# Patient Record
Sex: Female | Born: 1945
Health system: Southern US, Community
[De-identification: ages and names within clinical notes are randomized; demographics above are authoritative.]

## PROBLEM LIST (undated history)

## (undated) DIAGNOSIS — F329 Major depressive disorder, single episode, unspecified: Secondary | ICD-10-CM

## (undated) DIAGNOSIS — F419 Anxiety disorder, unspecified: Secondary | ICD-10-CM

## (undated) DIAGNOSIS — M1990 Unspecified osteoarthritis, unspecified site: Secondary | ICD-10-CM

## (undated) DIAGNOSIS — F411 Generalized anxiety disorder: Secondary | ICD-10-CM

## (undated) DIAGNOSIS — F32A Depression, unspecified: Secondary | ICD-10-CM

## (undated) DIAGNOSIS — Z9889 Other specified postprocedural states: Secondary | ICD-10-CM

## (undated) DIAGNOSIS — L719 Rosacea, unspecified: Secondary | ICD-10-CM

## (undated) DIAGNOSIS — K649 Unspecified hemorrhoids: Secondary | ICD-10-CM

## (undated) DIAGNOSIS — G4700 Insomnia, unspecified: Secondary | ICD-10-CM

## (undated) DIAGNOSIS — I2699 Other pulmonary embolism without acute cor pulmonale: Secondary | ICD-10-CM

## (undated) DIAGNOSIS — E0500 Thyrotoxicosis with diffuse goiter without thyrotoxic crisis or storm: Secondary | ICD-10-CM

## (undated) DIAGNOSIS — G629 Polyneuropathy, unspecified: Secondary | ICD-10-CM

## (undated) DIAGNOSIS — G479 Sleep disorder, unspecified: Secondary | ICD-10-CM

## (undated) DIAGNOSIS — H01003 Unspecified blepharitis right eye, unspecified eyelid: Secondary | ICD-10-CM

## (undated) DIAGNOSIS — E559 Vitamin D deficiency, unspecified: Secondary | ICD-10-CM

## (undated) DIAGNOSIS — L4050 Arthropathic psoriasis, unspecified: Secondary | ICD-10-CM

## (undated) DIAGNOSIS — Z86711 Personal history of pulmonary embolism: Secondary | ICD-10-CM

## (undated) DIAGNOSIS — M8580 Other specified disorders of bone density and structure, unspecified site: Secondary | ICD-10-CM

## (undated) DIAGNOSIS — I739 Peripheral vascular disease, unspecified: Secondary | ICD-10-CM

## (undated) DIAGNOSIS — H04129 Dry eye syndrome of unspecified lacrimal gland: Secondary | ICD-10-CM

## (undated) DIAGNOSIS — E7800 Pure hypercholesterolemia, unspecified: Secondary | ICD-10-CM

## (undated) DIAGNOSIS — G43909 Migraine, unspecified, not intractable, without status migrainosus: Secondary | ICD-10-CM

## (undated) DIAGNOSIS — K219 Gastro-esophageal reflux disease without esophagitis: Secondary | ICD-10-CM

## (undated) DIAGNOSIS — I10 Essential (primary) hypertension: Secondary | ICD-10-CM

## (undated) DIAGNOSIS — E669 Obesity, unspecified: Secondary | ICD-10-CM

## (undated) DIAGNOSIS — E039 Hypothyroidism, unspecified: Secondary | ICD-10-CM

## (undated) DIAGNOSIS — M129 Arthropathy, unspecified: Secondary | ICD-10-CM

## (undated) DIAGNOSIS — I8290 Acute embolism and thrombosis of unspecified vein: Secondary | ICD-10-CM

## (undated) DIAGNOSIS — R112 Nausea with vomiting, unspecified: Secondary | ICD-10-CM

## (undated) HISTORY — PX: SPHINCTEROTOMY: SHX5279

## (undated) HISTORY — DX: Personal history of pulmonary embolism: Z86.711

## (undated) HISTORY — DX: Pure hypercholesterolemia, unspecified: E78.00

## (undated) HISTORY — DX: Anxiety disorder, unspecified: F41.9

## (undated) HISTORY — DX: Unspecified hemorrhoids: K64.9

## (undated) HISTORY — PX: TONSILLECTOMY: SUR1361

## (undated) HISTORY — DX: Polyneuropathy, unspecified: G62.9

## (undated) HISTORY — DX: Sleep disorder, unspecified: G47.9

## (undated) HISTORY — DX: Thyrotoxicosis with diffuse goiter without thyrotoxic crisis or storm: E05.00

## (undated) HISTORY — DX: Obesity, unspecified: E66.9

## (undated) HISTORY — DX: Vitamin D deficiency, unspecified: E55.9

## (undated) HISTORY — PX: OTHER SURGICAL HISTORY: SHX169

## (undated) HISTORY — DX: Generalized anxiety disorder: F41.1

## (undated) HISTORY — DX: Rosacea, unspecified: L71.9

## (undated) HISTORY — DX: Insomnia, unspecified: G47.00

## (undated) HISTORY — DX: Peripheral vascular disease, unspecified: I73.9

## (undated) HISTORY — DX: Dry eye syndrome of unspecified lacrimal gland: H04.129

## (undated) HISTORY — DX: Migraine, unspecified, not intractable, without status migrainosus: G43.909

## (undated) HISTORY — DX: Other specified disorders of bone density and structure, unspecified site: M85.80

## (undated) HISTORY — DX: Arthropathy, unspecified: M12.9

## (undated) HISTORY — DX: Unspecified blepharitis right eye, unspecified eyelid: H01.003

## (undated) HISTORY — DX: Hypothyroidism, unspecified: E03.9

## (undated) HISTORY — DX: Arthropathic psoriasis, unspecified: L40.50

---
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
## 1970-04-24 HISTORY — PX: OTHER SURGICAL HISTORY: SHX169

## 1974-04-24 HISTORY — PX: OTHER SURGICAL HISTORY: SHX169

## 1988-12-23 HISTORY — PX: SPHINCTEROTOMY: SHX5279

## 1993-06-22 DIAGNOSIS — I2699 Other pulmonary embolism without acute cor pulmonale: Secondary | ICD-10-CM

## 1993-06-22 HISTORY — DX: Other pulmonary embolism without acute cor pulmonale: I26.99

## 1994-04-24 DIAGNOSIS — I82409 Acute embolism and thrombosis of unspecified deep veins of unspecified lower extremity: Secondary | ICD-10-CM

## 1994-04-24 HISTORY — DX: Acute embolism and thrombosis of unspecified deep veins of unspecified lower extremity: I82.409

## 1998-12-17 ENCOUNTER — Other Ambulatory Visit: Admission: RE | Admit: 1998-12-17 | Discharge: 1998-12-17 | Payer: Self-pay | Admitting: Obstetrics and Gynecology

## 1999-12-05 ENCOUNTER — Encounter: Admission: RE | Admit: 1999-12-05 | Discharge: 1999-12-05 | Payer: Self-pay | Admitting: Internal Medicine

## 1999-12-05 ENCOUNTER — Encounter: Payer: Self-pay | Admitting: Internal Medicine

## 2000-01-31 ENCOUNTER — Other Ambulatory Visit: Admission: RE | Admit: 2000-01-31 | Discharge: 2000-01-31 | Payer: Self-pay | Admitting: Obstetrics and Gynecology

## 2001-03-04 ENCOUNTER — Other Ambulatory Visit: Admission: RE | Admit: 2001-03-04 | Discharge: 2001-03-04 | Payer: Self-pay | Admitting: Obstetrics and Gynecology

## 2001-10-29 ENCOUNTER — Ambulatory Visit (HOSPITAL_COMMUNITY): Admission: RE | Admit: 2001-10-29 | Discharge: 2001-10-29 | Payer: Self-pay | Admitting: Gastroenterology

## 2001-12-26 ENCOUNTER — Encounter: Admission: RE | Admit: 2001-12-26 | Discharge: 2002-02-04 | Payer: Self-pay

## 2002-03-04 ENCOUNTER — Other Ambulatory Visit: Admission: RE | Admit: 2002-03-04 | Discharge: 2002-03-04 | Payer: Self-pay | Admitting: Family Medicine

## 2002-05-07 ENCOUNTER — Encounter: Admission: RE | Admit: 2002-05-07 | Discharge: 2002-08-05 | Payer: Self-pay | Admitting: Family Medicine

## 2003-11-09 ENCOUNTER — Other Ambulatory Visit: Admission: RE | Admit: 2003-11-09 | Discharge: 2003-11-09 | Payer: Self-pay | Admitting: Family Medicine

## 2004-08-11 ENCOUNTER — Encounter: Admission: RE | Admit: 2004-08-11 | Discharge: 2004-09-15 | Payer: Self-pay | Admitting: Family Medicine

## 2004-11-14 ENCOUNTER — Other Ambulatory Visit: Admission: RE | Admit: 2004-11-14 | Discharge: 2004-11-14 | Payer: Self-pay | Admitting: Family Medicine

## 2007-12-16 ENCOUNTER — Encounter: Admission: RE | Admit: 2007-12-16 | Discharge: 2007-12-16 | Payer: Self-pay | Admitting: Family Medicine

## 2008-04-22 ENCOUNTER — Encounter: Admission: RE | Admit: 2008-04-22 | Discharge: 2008-04-22 | Payer: Self-pay | Admitting: Gastroenterology

## 2010-05-23 ENCOUNTER — Other Ambulatory Visit: Payer: Self-pay

## 2010-05-23 ENCOUNTER — Other Ambulatory Visit: Payer: Self-pay | Admitting: Family Medicine

## 2010-05-23 DIAGNOSIS — R102 Pelvic and perineal pain: Secondary | ICD-10-CM

## 2010-05-30 ENCOUNTER — Other Ambulatory Visit: Payer: Self-pay

## 2010-05-30 DIAGNOSIS — R102 Pelvic and perineal pain: Secondary | ICD-10-CM

## 2010-05-31 ENCOUNTER — Ambulatory Visit
Admission: RE | Admit: 2010-05-31 | Discharge: 2010-05-31 | Disposition: A | Discharge status: Home / Self Care | Payer: Medicare Other | Source: Ambulatory Visit

## 2010-05-31 DIAGNOSIS — R102 Pelvic and perineal pain: Secondary | ICD-10-CM

## 2010-08-15 ENCOUNTER — Other Ambulatory Visit: Payer: Self-pay | Admitting: Family Medicine

## 2010-08-15 DIAGNOSIS — R102 Pelvic and perineal pain: Secondary | ICD-10-CM

## 2011-04-21 ENCOUNTER — Ambulatory Visit: Payer: Medicare Other | Attending: Internal Medicine | Admitting: Physical Therapy

## 2011-04-21 DIAGNOSIS — M25569 Pain in unspecified knee: Secondary | ICD-10-CM | POA: Insufficient documentation

## 2011-04-21 DIAGNOSIS — IMO0001 Reserved for inherently not codable concepts without codable children: Secondary | ICD-10-CM | POA: Insufficient documentation

## 2011-04-26 ENCOUNTER — Encounter: Payer: Medicare Other | Admitting: Physical Therapy

## 2011-04-27 ENCOUNTER — Encounter: Payer: Medicare Other | Admitting: Physical Therapy

## 2011-05-02 ENCOUNTER — Encounter: Payer: Medicare Other | Admitting: Physical Therapy

## 2011-05-04 ENCOUNTER — Encounter: Payer: Medicare Other | Admitting: Physical Therapy

## 2011-05-09 ENCOUNTER — Encounter: Payer: Medicare Other | Admitting: Physical Therapy

## 2011-05-11 ENCOUNTER — Encounter: Payer: Medicare Other | Admitting: Physical Therapy

## 2011-05-16 ENCOUNTER — Encounter: Payer: Medicare Other | Admitting: Physical Therapy

## 2011-05-18 ENCOUNTER — Encounter: Payer: Medicare Other | Admitting: Physical Therapy

## 2011-05-23 ENCOUNTER — Ambulatory Visit: Payer: Medicare Other | Attending: Internal Medicine

## 2011-05-23 ENCOUNTER — Encounter: Payer: Medicare Other | Admitting: Physical Therapy

## 2011-05-23 DIAGNOSIS — IMO0001 Reserved for inherently not codable concepts without codable children: Secondary | ICD-10-CM | POA: Insufficient documentation

## 2011-05-23 DIAGNOSIS — M25569 Pain in unspecified knee: Secondary | ICD-10-CM | POA: Insufficient documentation

## 2011-05-25 ENCOUNTER — Ambulatory Visit: Payer: Medicare Other

## 2011-05-25 ENCOUNTER — Encounter: Payer: Medicare Other | Admitting: Physical Therapy

## 2011-05-30 ENCOUNTER — Ambulatory Visit: Payer: Medicare Other | Attending: Internal Medicine

## 2011-05-30 DIAGNOSIS — IMO0001 Reserved for inherently not codable concepts without codable children: Secondary | ICD-10-CM | POA: Insufficient documentation

## 2011-05-30 DIAGNOSIS — M25569 Pain in unspecified knee: Secondary | ICD-10-CM | POA: Insufficient documentation

## 2011-06-01 ENCOUNTER — Ambulatory Visit: Payer: Medicare Other

## 2011-06-05 ENCOUNTER — Ambulatory Visit: Payer: Medicare Other

## 2011-06-08 ENCOUNTER — Ambulatory Visit: Payer: Medicare Other

## 2011-06-12 ENCOUNTER — Ambulatory Visit: Payer: Medicare Other

## 2011-06-15 ENCOUNTER — Ambulatory Visit: Payer: Medicare Other

## 2011-09-07 ENCOUNTER — Other Ambulatory Visit: Payer: Self-pay | Admitting: Family Medicine

## 2011-09-07 ENCOUNTER — Other Ambulatory Visit (HOSPITAL_COMMUNITY)
Admission: RE | Admit: 2011-09-07 | Discharge: 2011-09-07 | Disposition: A | Discharge status: Home / Self Care | Payer: Medicare Other | Source: Ambulatory Visit | Attending: Family Medicine | Admitting: Family Medicine

## 2011-09-07 DIAGNOSIS — Z1159 Encounter for screening for other viral diseases: Secondary | ICD-10-CM | POA: Insufficient documentation

## 2011-09-07 DIAGNOSIS — Z124 Encounter for screening for malignant neoplasm of cervix: Secondary | ICD-10-CM | POA: Insufficient documentation

## 2012-04-24 HISTORY — PX: OTHER SURGICAL HISTORY: SHX169

## 2012-09-02 ENCOUNTER — Encounter (HOSPITAL_COMMUNITY): Payer: Self-pay | Admitting: Pharmacy Technician

## 2012-09-03 ENCOUNTER — Other Ambulatory Visit: Payer: Self-pay | Admitting: Orthopedic Surgery

## 2012-09-03 MED ORDER — DEXAMETHASONE SODIUM PHOSPHATE 10 MG/ML IJ SOLN: 10.0000 mg | Freq: Once | INTRAMUSCULAR | Status: DC

## 2012-09-03 NOTE — Progress Notes (Signed)
Preoperative surgical orders have been place into the [facility] hospital system for Ashley Griffith on 09/03/2012, 9:18 AM  by Patrica Duel for surgery on 09/18/2012.  Preop Knee Scope orders including IV Tylenol and IV Decadron as long as there are no contraindications to the above medications. Avel Peace, PA-C

## 2012-09-10 ENCOUNTER — Other Ambulatory Visit (HOSPITAL_COMMUNITY): Payer: Self-pay | Admitting: Orthopedic Surgery

## 2012-09-10 NOTE — Patient Instructions (Addendum)
Ashley Griffith  5/Ashley/2014   Your procedure is scheduled on: 09-18-2012  Report to [facility] at 645 AM.  Call this number if you have problems the morning of surgery [phone]   Remember:   Do not eat food or drink liquids :After Midnight.     Take these medicines the morning of surgery with A SIP OF WATER:  Synthroid, wellbutrin, pepcid, fluticasone nasal spray                                SEE [place] PREPARING FOR SURGERY SHEET   Do not wear jewelry, make-up or nail polish.  Do not wear lotions, powders, or perfumes. You may wear deodorant.   Men may shave face and neck.  Do not bring valuables to the hospital.  Contacts, dentures or bridgework may not be worn into surgery.  Leave suitcase in the car. After surgery it may be brought to your room.  For patients admitted to the hospital, checkout time is 11:00 AM the day of discharge.   Patients discharged the day of surgery will not be allowed to drive home.  Name and phone number of your driver:spouse Ashley Griffith will be here no cell  Special Instructions: N/A   Please read over the following fact sheets that you were given: MRSA Information.  Call Cain Sieve RN pre op nurse if needed [phone]    FAILURE TO FOLLOW THESE INSTRUCTIONS MAY RESULT IN THE CANCELLATION OF YOUR SURGERY. PATIENT SIGNATURE___________________________________________

## 2012-09-10 NOTE — Progress Notes (Signed)
Medical clearance note and ekg 09-03-2012 dr Benetta Spar rankins on chart

## 2012-09-11 ENCOUNTER — Encounter (HOSPITAL_COMMUNITY)
Admission: RE | Admit: 2012-09-11 | Discharge: 2012-09-11 | Disposition: A | Discharge status: Home / Self Care | Payer: Medicare Other | Source: Ambulatory Visit | Attending: Orthopedic Surgery | Admitting: Orthopedic Surgery

## 2012-09-11 ENCOUNTER — Encounter (HOSPITAL_COMMUNITY): Payer: Self-pay

## 2012-09-11 HISTORY — DX: Other pulmonary embolism without acute cor pulmonale: I26.99

## 2012-09-11 HISTORY — DX: Unspecified osteoarthritis, unspecified site: M19.90

## 2012-09-11 HISTORY — DX: Depression, unspecified: F32.A

## 2012-09-11 HISTORY — DX: Other specified postprocedural states: R11.2

## 2012-09-11 HISTORY — DX: Acute embolism and thrombosis of unspecified vein: I82.90

## 2012-09-11 HISTORY — DX: Gastro-esophageal reflux disease without esophagitis: K21.9

## 2012-09-11 HISTORY — DX: Rosacea, unspecified: L71.9

## 2012-09-11 HISTORY — DX: Major depressive disorder, single episode, unspecified: F32.9

## 2012-09-11 HISTORY — DX: Other specified postprocedural states: Z98.890

## 2012-09-11 LAB — SURGICAL PCR SCREEN: Staphylococcus aureus: NEGATIVE

## 2012-09-11 NOTE — Progress Notes (Addendum)
Cbc with dif, cmet 08-26-12 dr Dierdre Forth rheumatology on chart Pt 08-21-2012 dr Barbaraann Barthel on chart

## 2012-09-11 NOTE — Progress Notes (Signed)
Medical clearance note dr Barbaraann Barthel placed on chart

## 2012-09-17 NOTE — Progress Notes (Signed)
Pt results 09-17-2012 dr Benetta Spar rankins on chart

## 2012-09-17 NOTE — H&P (Signed)
CC- Ashley Griffith is a 67 y.o. female who presents with right knee pain.  HPI- . Knee Pain: Patient presents with knee pain involving the  right knee. Onset of the symptoms was several months ago. Inciting event: none known. Current symptoms include foreign body sensation, giving out, pain located medial and lateral, stiffness and swelling. Pain is aggravated by lateral movements, pivoting, rising after sitting and squatting.  Patient has had no prior knee problems. Evaluation to date: MRI: abnormal medial meniscal tear. Treatment to date: avoidance of offending activity and corticosteroid injection which was not very effective.  Past Medical History  Diagnosis Date  . Pulmonary embolism march 1995  . Blood clot in vein     leg 11-23-1004  . GERD (gastroesophageal reflux disease)   . Arthritis   . PONV (postoperative nausea and vomiting)   . Depression   . Rosacea     Past Surgical History  Procedure Laterality Date  . Tonsillectomy  as child  . Nasal poly removed  age 46  . Blood mass removed from abdomen  1972    ? questionable ectopic pregnancy  . Childbirth  1976  . Sphincterotomy  1990's  . Colonscopy      x 2  . Sclorotomy both legs veins  2005 or 2006    Prior to Admission medications   Medication Sig Start Date End Date Taking? Authorizing Provider  aluminum & magnesium hydroxide (MAALOX) 225-200 MG/5ML suspension Take by mouth every 6 (six) hours as needed for indigestion.    Historical Provider, MD  buPROPion (WELLBUTRIN XL) 150 MG 24 hr tablet Take 150 mg by mouth daily before breakfast.    Historical Provider, MD  Calcium Carbonate Antacid (MAALOX) 600 MG chewable tablet Chew 1,800 mg by mouth at bedtime.    Historical Provider, MD  Calcium Citrate-Vitamin D (CALCIUM CITRATE + D PO) Take 1 tablet by mouth 2 (two) times daily.    Historical Provider, MD  clindamycin (CLINDAGEL) 1 % gel Apply 1 application topically 2 (two) times daily.    Historical Provider, MD   DHA-EPA-Coenzyme Q10-Vitamin E 60-90-25-200 CAPS Take 3 capsules by mouth daily.    Historical Provider, MD  diazepam (VALIUM) 2 MG tablet Take 2 mg by mouth at bedtime as needed for anxiety.    Historical Provider, MD  etanercept (ENBREL) 50 MG/ML injection Inject 50 mg into the skin once a week.    Historical Provider, MD  famotidine (PEPCID) 10 MG tablet Take 10 mg by mouth 2 (two) times daily as needed for heartburn.    Historical Provider, MD  fluticasone (FLONASE) 50 MCG/ACT nasal spray Place 1 spray into the nose daily as needed for allergies.    Historical Provider, MD  hydrocortisone (ANUSOL-HC) 2.5 % rectal cream Place 1 application rectally 2 (two) times daily as needed for hemorrhoids.    Historical Provider, MD  levothyroxine (SYNTHROID, LEVOTHROID) 50 MCG tablet Take 50 mcg by mouth daily before breakfast.    Historical Provider, MD  traMADol (ULTRAM) 50 MG tablet Take 50 mg by mouth every 8 (eight) hours as needed for pain.    Historical Provider, MD  warfarin (COUMADIN) 4 MG tablet Take 4 mg by mouth daily before breakfast.    Historical Provider, MD   KNEE EXAM antalgic gait, soft tissue tenderness over medial joint line; no effusion  Physical Examination: General appearance - alert, well appearing, and in no distress Mental status - alert, oriented to person, place, and time Chest - clear  to auscultation, no wheezes, rales or rhonchi, symmetric air entry Heart - normal rate, regular rhythm, normal S1, S2, no murmurs, rubs, clicks or gallops Abdomen - soft, nontender, nondistended, no masses or organomegaly Neurological - alert, oriented, normal speech, no focal findings or movement disorder noted     Asessment/Plan--- Right knee medial meniscal tear- - Plan right knee arthroscopy with meniscal debridement. Procedure risks and potential comps discussed with patient who elects to proceed. Goals are decreased pain and increased function with a high likelihood of achieving both

## 2012-09-18 ENCOUNTER — Encounter (HOSPITAL_COMMUNITY): Payer: Self-pay | Admitting: Registered Nurse

## 2012-09-18 ENCOUNTER — Ambulatory Visit (HOSPITAL_COMMUNITY): Payer: Medicare Other | Admitting: Registered Nurse

## 2012-09-18 ENCOUNTER — Ambulatory Visit (HOSPITAL_COMMUNITY)
Admission: RE | Admit: 2012-09-18 | Discharge: 2012-09-18 | Disposition: A | Discharge status: Home / Self Care | Payer: Medicare Other | Source: Ambulatory Visit | Attending: Orthopedic Surgery | Admitting: Orthopedic Surgery

## 2012-09-18 ENCOUNTER — Encounter (HOSPITAL_COMMUNITY): Payer: Self-pay | Admitting: General Practice

## 2012-09-18 ENCOUNTER — Encounter (HOSPITAL_COMMUNITY)
Admission: RE | Disposition: A | Discharge status: Home / Self Care | Payer: Self-pay | Source: Ambulatory Visit | Attending: Orthopedic Surgery

## 2012-09-18 DIAGNOSIS — S83289A Other tear of lateral meniscus, current injury, unspecified knee, initial encounter: Secondary | ICD-10-CM | POA: Insufficient documentation

## 2012-09-18 DIAGNOSIS — M2390 Unspecified internal derangement of unspecified knee: Secondary | ICD-10-CM | POA: Insufficient documentation

## 2012-09-18 DIAGNOSIS — Z86718 Personal history of other venous thrombosis and embolism: Secondary | ICD-10-CM | POA: Insufficient documentation

## 2012-09-18 DIAGNOSIS — Z79899 Other long term (current) drug therapy: Secondary | ICD-10-CM | POA: Insufficient documentation

## 2012-09-18 DIAGNOSIS — F329 Major depressive disorder, single episode, unspecified: Secondary | ICD-10-CM | POA: Insufficient documentation

## 2012-09-18 DIAGNOSIS — S83249A Other tear of medial meniscus, current injury, unspecified knee, initial encounter: Secondary | ICD-10-CM

## 2012-09-18 DIAGNOSIS — L719 Rosacea, unspecified: Secondary | ICD-10-CM | POA: Insufficient documentation

## 2012-09-18 DIAGNOSIS — F3289 Other specified depressive episodes: Secondary | ICD-10-CM | POA: Insufficient documentation

## 2012-09-18 DIAGNOSIS — K219 Gastro-esophageal reflux disease without esophagitis: Secondary | ICD-10-CM | POA: Insufficient documentation

## 2012-09-18 DIAGNOSIS — X58XXXA Exposure to other specified factors, initial encounter: Secondary | ICD-10-CM | POA: Insufficient documentation

## 2012-09-18 DIAGNOSIS — S83241A Other tear of medial meniscus, current injury, right knee, initial encounter: Secondary | ICD-10-CM

## 2012-09-18 DIAGNOSIS — Z7901 Long term (current) use of anticoagulants: Secondary | ICD-10-CM | POA: Insufficient documentation

## 2012-09-18 DIAGNOSIS — Z86711 Personal history of pulmonary embolism: Secondary | ICD-10-CM | POA: Insufficient documentation

## 2012-09-18 DIAGNOSIS — M129 Arthropathy, unspecified: Secondary | ICD-10-CM | POA: Insufficient documentation

## 2012-09-18 HISTORY — PX: KNEE ARTHROSCOPY: SHX127

## 2012-09-18 SURGERY — ARTHROSCOPY, KNEE
Anesthesia: General | Site: Knee | Laterality: Right | Wound class: Clean

## 2012-09-18 MED ORDER — SODIUM CHLORIDE 0.9 % IV SOLN: INTRAVENOUS | Status: DC

## 2012-09-18 MED ORDER — FENTANYL CITRATE 0.05 MG/ML IJ SOLN
INTRAMUSCULAR | Status: AC
  Filled 2012-09-18: qty 2

## 2012-09-18 MED ORDER — BUPIVACAINE-EPINEPHRINE PF 0.25-1:200000 % IJ SOLN
INTRAMUSCULAR | Status: AC
  Filled 2012-09-18: qty 30

## 2012-09-18 MED ORDER — ACETAMINOPHEN 10 MG/ML IV SOLN
1000.0000 mg | Freq: Once | INTRAVENOUS | Status: AC
  Administered 2012-09-18: 1000 mg via INTRAVENOUS

## 2012-09-18 MED ORDER — FENTANYL CITRATE 0.05 MG/ML IJ SOLN
25.0000 ug | INTRAMUSCULAR | Status: DC | PRN
  Administered 2012-09-18 (×2): 50 ug via INTRAVENOUS

## 2012-09-18 MED ORDER — MEPERIDINE HCL 50 MG/ML IJ SOLN: 6.2500 mg | INTRAMUSCULAR | Status: DC | PRN

## 2012-09-18 MED ORDER — ACETAMINOPHEN 10 MG/ML IV SOLN
INTRAVENOUS | Status: AC
  Filled 2012-09-18: qty 100

## 2012-09-18 MED ORDER — OXYCODONE HCL 5 MG PO TABS
5.0000 mg | ORAL_TABLET | ORAL | Status: DC | PRN
  Administered 2012-09-18: 5 mg via ORAL
  Filled 2012-09-18: qty 1

## 2012-09-18 MED ORDER — OXYCODONE HCL 5 MG PO TABS: 5.0000 mg | ORAL_TABLET | ORAL | Status: DC | PRN

## 2012-09-18 MED ORDER — MIDAZOLAM HCL 5 MG/5ML IJ SOLN
INTRAMUSCULAR | Status: DC | PRN
  Administered 2012-09-18: 2 mg via INTRAVENOUS

## 2012-09-18 MED ORDER — BUPIVACAINE-EPINEPHRINE 0.25% -1:200000 IJ SOLN
INTRAMUSCULAR | Status: DC | PRN
  Administered 2012-09-18: 20 mL

## 2012-09-18 MED ORDER — METHOCARBAMOL 500 MG PO TABS: 500.0000 mg | ORAL_TABLET | Freq: Four times a day (QID) | ORAL | Status: DC

## 2012-09-18 MED ORDER — LACTATED RINGERS IR SOLN
Status: DC | PRN
  Administered 2012-09-18: 9000 mL

## 2012-09-18 MED ORDER — VANCOMYCIN HCL 10 G IV SOLR
1500.0000 mg | INTRAVENOUS | Status: AC
  Administered 2012-09-18: 1500 mg via INTRAVENOUS
  Filled 2012-09-18: qty 1500

## 2012-09-18 MED ORDER — LACTATED RINGERS IV SOLN
INTRAVENOUS | Status: DC
  Administered 2012-09-18: 1000 mL via INTRAVENOUS

## 2012-09-18 MED ORDER — ONDANSETRON HCL 4 MG PO TABS: 4.0000 mg | ORAL_TABLET | Freq: Three times a day (TID) | ORAL | Status: DC | PRN

## 2012-09-18 MED ORDER — PROMETHAZINE HCL 25 MG/ML IJ SOLN: 6.2500 mg | INTRAMUSCULAR | Status: DC | PRN

## 2012-09-18 MED ORDER — WARFARIN SODIUM 4 MG PO TABS: 4.0000 mg | ORAL_TABLET | Freq: Every day | ORAL | Status: DC

## 2012-09-18 MED ORDER — PROPOFOL 10 MG/ML IV BOLUS
INTRAVENOUS | Status: DC | PRN
  Administered 2012-09-18: 30 mg via INTRAVENOUS
  Administered 2012-09-18: 130 mg via INTRAVENOUS

## 2012-09-18 MED ORDER — FENTANYL CITRATE 0.05 MG/ML IJ SOLN
INTRAMUSCULAR | Status: DC | PRN
  Administered 2012-09-18: 25 ug via INTRAVENOUS
  Administered 2012-09-18: 50 ug via INTRAVENOUS

## 2012-09-18 SURGICAL SUPPLY — 22 items
BLADE 4.2CUDA (BLADE) ×2 IMPLANT
CLOTH BEACON ORANGE TIMEOUT ST (SAFETY) ×2 IMPLANT
CUFF TOURN SGL QUICK 34 (TOURNIQUET CUFF) ×2
CUFF TRNQT CYL 34X4X40X1 (TOURNIQUET CUFF) ×1 IMPLANT
DRAPE U-SHAPE 47X51 STRL (DRAPES) ×2 IMPLANT
DRSG EMULSION OIL 3X3 NADH (GAUZE/BANDAGES/DRESSINGS) ×2 IMPLANT
DURAPREP 26ML APPLICATOR (WOUND CARE) ×2 IMPLANT
GLOVE BIO SURGEON STRL SZ8 (GLOVE) ×2 IMPLANT
GLOVE BIOGEL PI IND STRL 8 (GLOVE) ×1 IMPLANT
GLOVE BIOGEL PI INDICATOR 8 (GLOVE) ×1
GOWN STRL NON-REIN LRG LVL3 (GOWN DISPOSABLE) ×2 IMPLANT
MANIFOLD NEPTUNE II (INSTRUMENTS) ×4 IMPLANT
PACK ARTHROSCOPY WL (CUSTOM PROCEDURE TRAY) ×2 IMPLANT
PACK ICE MAXI GEL EZY WRAP (MISCELLANEOUS) ×6 IMPLANT
PADDING CAST COTTON 6X4 STRL (CAST SUPPLIES) ×4 IMPLANT
POSITIONER SURGICAL ARM (MISCELLANEOUS) ×2 IMPLANT
SET ARTHROSCOPY TUBING (MISCELLANEOUS) ×2
SET ARTHROSCOPY TUBING LN (MISCELLANEOUS) ×1 IMPLANT
SUT ETHILON 4 0 PS 2 18 (SUTURE) ×2 IMPLANT
TOWEL OR 17X26 10 PK STRL BLUE (TOWEL DISPOSABLE) ×2 IMPLANT
WAND 90 DEG TURBOVAC W/CORD (SURGICAL WAND) ×2 IMPLANT
WRAP KNEE MAXI GEL POST OP (GAUZE/BANDAGES/DRESSINGS) ×4 IMPLANT

## 2012-09-18 NOTE — Transfer of Care (Signed)
Immediate Anesthesia Transfer of Care Note  Patient: Ashley Griffith  Procedure(s) Performed: Procedure(s): RIGHT ARTHROSCOPY KNEE, Tlateral meniscal debdridement and chondroplasty (Right)  Patient Location: PACU  Anesthesia Type:General  Level of Consciousness: awake, sedated and patient cooperative  Airway & Oxygen Therapy: Patient Spontanous Breathing and Patient connected to face mask oxygen  Post-op Assessment: Report given to PACU RN and Post -op Vital signs reviewed and stable  Post vital signs: Reviewed and stable  Complications: No apparent anesthesia complications

## 2012-09-18 NOTE — Op Note (Signed)
Preoperative diagnosis-  Right knee medial meniscal tear  Postoperative diagnosis Right- knee lateral meniscal tear Plus Right medial femoral chondral defect  Procedure- Right knee arthroscopy with lateral Meniscal debridement and chondroplasty   Surgeon- Gus Rankin. Darean Rote, MD  Anesthesia-General  EBL-  minimal Complications- None  Condition- PACU - hemodynamically stable.  Brief clinical note- -Ashley Griffith is a 66 y.o.  female with a several month history of right knee pain and mechanical symptoms. Exam and history suggested medial meniscal tear confirmed by MRI. The patient presents now for arthroscopy and debridement   Procedure in detail -       After successful administration of General anesthetic, a tourmiquet is placed high on the Right  thigh and the Right lower extremity is prepped and draped in the usual sterile fashion. Time out is performed by the surgical team. Standard superomedial and inferolateral portal sites are marked and incisions made with an 11 blade. The inflow cannula is passed through the superomedial portal and camera through the inferolateral portal and inflow is initiated. Arthroscopic visualization proceeds.      The undersurface of the patella and trochlea are visualized and there are bone on bone changes without any unstable cartilage lesions. The medial and lateral gutters are visualized and there are  no loose bodies. Flexion and valgus force is applied to the knee and the medial compartment is entered. A spinal needle is passed into the joint through the site marked for the inferomedial portal. A small incision is made and the dilator passed into the joint. The findings for the medial compartment are unstable chondral defect of the medial femoral condyle approximately 1 x 2 cm.  The shaver is used to debride the unstable cartilage to a stable bony base with stable edges. It is probed and found to be stable.    The intercondylar notch is visualized and  the ACL appears attenuated. There is also a large intercondylar spur at the ACL origin anteriorly. The lateral compartment is entered and the findings are unstable tear of the anterior horn of the lateral meniscus . The tear is debrided to a stable base with baskets and a shaver and sealed off with the Arthrocare.      The joint is again inspected and there are no other tears, defects or loose bodies identified. The arthroscopic equipment is then removed from the inferior portals which are closed with interrupted 4-0 nylon. 20 ml of .25% Marcaine with epinephrine are injected through the inflow cannula and the cannula is then removed and the portal closed with nylon. The incisions are cleaned and dried and a bulky sterile dressing is applied. The patient is then awakened and transported to recovery in stable condition.   09/18/2012, 9:39 AM

## 2012-09-18 NOTE — Interval H&P Note (Signed)
History and Physical Interval Note:  09/18/2012 8:48 AM  Ashley Griffith  has presented today for surgery, with the diagnosis of RIGHT KNEE MEDIAL MENISCUS TEAR  The various methods of treatment have been discussed with the patient and family. After consideration of risks, benefits and other options for treatment, the patient has consented to  Procedure(s): RIGHT ARTHROSCOPY KNEE WITH DEBRIDEMENT (Right) as a surgical intervention .  The patient's history has been reviewed, patient examined, no change in status, stable for surgery.  I have reviewed the patient's chart and labs.  Questions were answered to the patient's satisfaction.     Loanne Drilling

## 2012-09-18 NOTE — Progress Notes (Signed)
Patient states that she has used crutches in the past; however, she had a very difficult time due to arthritis. Paged Avel Peace PA and obtained order for walker.

## 2012-09-18 NOTE — Anesthesia Postprocedure Evaluation (Signed)
  Anesthesia Post-op Note  Patient: Ashley Griffith  Procedure(s) Performed: Procedure(s) (LRB): RIGHT ARTHROSCOPY KNEE, Tlateral meniscal debdridement and chondroplasty (Right)  Patient Location: PACU  Anesthesia Type: General  Level of Consciousness: awake and alert   Airway and Oxygen Therapy: Patient Spontanous Breathing  Post-op Pain: mild  Post-op Assessment: Post-op Vital signs reviewed, Patient's Cardiovascular Status Stable, Respiratory Function Stable, Patent Airway and No signs of Nausea or vomiting  Last Vitals:  Filed Vitals:   09/18/12 1207  BP: 110/67  Pulse: 58  Temp: 36.6 C  Resp: 14    Post-op Vital Signs: stable   Complications: No apparent anesthesia complications

## 2012-09-18 NOTE — Anesthesia Preprocedure Evaluation (Addendum)
Anesthesia Evaluation  Patient identified by MRN, date of birth, ID band Patient awake    Reviewed: Allergy & Precautions, H&P , NPO status , Patient's Chart, lab work & pertinent test results  History of Anesthesia Complications (+) PONV  Airway Mallampati: II TM Distance: >3 FB Neck ROM: Full    Dental no notable dental hx.    Pulmonary neg pulmonary ROS, PE (1995) breath sounds clear to auscultation  Pulmonary exam normal       Cardiovascular negative cardio ROS  Rhythm:Regular Rate:Normal     Neuro/Psych negative neurological ROS  negative psych ROS   GI/Hepatic negative GI ROS, Neg liver ROS,   Endo/Other  negative endocrine ROS  Renal/GU negative Renal ROS  negative genitourinary   Musculoskeletal negative musculoskeletal ROS (+)   Abdominal   Peds negative pediatric ROS (+)  Hematology negative hematology ROS (+)   Anesthesia Other Findings Extensive upper and lower dental work  Reproductive/Obstetrics negative OB ROS                          Anesthesia Physical Anesthesia Plan  ASA: II  Anesthesia Plan: General   Post-op Pain Management:    Induction: Intravenous  Airway Management Planned: LMA  Additional Equipment:   Intra-op Plan:   Post-operative Plan: Extubation in OR  Informed Consent: I have reviewed the patients History and Physical, chart, labs and discussed the procedure including the risks, benefits and alternatives for the proposed anesthesia with the patient or authorized representative who has indicated his/her understanding and acceptance.   Dental advisory given  Plan Discussed with: CRNA  Anesthesia Plan Comments:         Anesthesia Quick Evaluation

## 2012-09-19 ENCOUNTER — Encounter (HOSPITAL_COMMUNITY): Payer: Self-pay | Admitting: Orthopedic Surgery

## 2013-05-07 DIAGNOSIS — Z7901 Long term (current) use of anticoagulants: Secondary | ICD-10-CM | POA: Diagnosis not present

## 2013-05-07 DIAGNOSIS — E785 Hyperlipidemia, unspecified: Secondary | ICD-10-CM | POA: Diagnosis not present

## 2013-05-07 DIAGNOSIS — Z86711 Personal history of pulmonary embolism: Secondary | ICD-10-CM | POA: Diagnosis not present

## 2013-05-16 DIAGNOSIS — L4050 Arthropathic psoriasis, unspecified: Secondary | ICD-10-CM | POA: Diagnosis not present

## 2013-05-29 DIAGNOSIS — L4050 Arthropathic psoriasis, unspecified: Secondary | ICD-10-CM | POA: Diagnosis not present

## 2013-06-05 DIAGNOSIS — M159 Polyosteoarthritis, unspecified: Secondary | ICD-10-CM | POA: Diagnosis not present

## 2013-06-05 DIAGNOSIS — M25569 Pain in unspecified knee: Secondary | ICD-10-CM | POA: Diagnosis not present

## 2013-06-05 DIAGNOSIS — L4050 Arthropathic psoriasis, unspecified: Secondary | ICD-10-CM | POA: Diagnosis not present

## 2013-06-09 DIAGNOSIS — Z86711 Personal history of pulmonary embolism: Secondary | ICD-10-CM | POA: Diagnosis not present

## 2013-06-09 DIAGNOSIS — Z7901 Long term (current) use of anticoagulants: Secondary | ICD-10-CM | POA: Diagnosis not present

## 2013-06-12 DIAGNOSIS — E789 Disorder of lipoprotein metabolism, unspecified: Secondary | ICD-10-CM | POA: Diagnosis not present

## 2013-06-12 DIAGNOSIS — E0789 Other specified disorders of thyroid: Secondary | ICD-10-CM | POA: Diagnosis not present

## 2013-06-18 DIAGNOSIS — R0609 Other forms of dyspnea: Secondary | ICD-10-CM | POA: Diagnosis not present

## 2013-06-18 DIAGNOSIS — E0789 Other specified disorders of thyroid: Secondary | ICD-10-CM | POA: Diagnosis not present

## 2013-06-18 DIAGNOSIS — E789 Disorder of lipoprotein metabolism, unspecified: Secondary | ICD-10-CM | POA: Diagnosis not present

## 2013-06-18 DIAGNOSIS — R0989 Other specified symptoms and signs involving the circulatory and respiratory systems: Secondary | ICD-10-CM | POA: Diagnosis not present

## 2013-06-26 DIAGNOSIS — L4050 Arthropathic psoriasis, unspecified: Secondary | ICD-10-CM | POA: Diagnosis not present

## 2013-07-03 DIAGNOSIS — Z961 Presence of intraocular lens: Secondary | ICD-10-CM | POA: Diagnosis not present

## 2013-07-07 DIAGNOSIS — Z7901 Long term (current) use of anticoagulants: Secondary | ICD-10-CM | POA: Diagnosis not present

## 2013-07-07 DIAGNOSIS — Z86711 Personal history of pulmonary embolism: Secondary | ICD-10-CM | POA: Diagnosis not present

## 2013-07-14 DIAGNOSIS — Z7901 Long term (current) use of anticoagulants: Secondary | ICD-10-CM | POA: Diagnosis not present

## 2013-07-14 DIAGNOSIS — Z86711 Personal history of pulmonary embolism: Secondary | ICD-10-CM | POA: Diagnosis not present

## 2013-07-15 DIAGNOSIS — L408 Other psoriasis: Secondary | ICD-10-CM | POA: Diagnosis not present

## 2013-08-11 DIAGNOSIS — Z7901 Long term (current) use of anticoagulants: Secondary | ICD-10-CM | POA: Diagnosis not present

## 2013-08-11 DIAGNOSIS — Z86711 Personal history of pulmonary embolism: Secondary | ICD-10-CM | POA: Diagnosis not present

## 2013-08-19 DIAGNOSIS — L820 Inflamed seborrheic keratosis: Secondary | ICD-10-CM | POA: Diagnosis not present

## 2013-08-19 DIAGNOSIS — L259 Unspecified contact dermatitis, unspecified cause: Secondary | ICD-10-CM | POA: Diagnosis not present

## 2013-08-19 DIAGNOSIS — L408 Other psoriasis: Secondary | ICD-10-CM | POA: Diagnosis not present

## 2013-08-19 DIAGNOSIS — L719 Rosacea, unspecified: Secondary | ICD-10-CM | POA: Diagnosis not present

## 2013-08-20 DIAGNOSIS — L4050 Arthropathic psoriasis, unspecified: Secondary | ICD-10-CM | POA: Diagnosis not present

## 2013-09-03 DIAGNOSIS — M159 Polyosteoarthritis, unspecified: Secondary | ICD-10-CM | POA: Diagnosis not present

## 2013-09-03 DIAGNOSIS — M25569 Pain in unspecified knee: Secondary | ICD-10-CM | POA: Diagnosis not present

## 2013-09-03 DIAGNOSIS — L4050 Arthropathic psoriasis, unspecified: Secondary | ICD-10-CM | POA: Diagnosis not present

## 2013-09-08 DIAGNOSIS — I2699 Other pulmonary embolism without acute cor pulmonale: Secondary | ICD-10-CM | POA: Diagnosis not present

## 2013-09-08 DIAGNOSIS — Z7901 Long term (current) use of anticoagulants: Secondary | ICD-10-CM | POA: Diagnosis not present

## 2013-09-09 DIAGNOSIS — L821 Other seborrheic keratosis: Secondary | ICD-10-CM | POA: Diagnosis not present

## 2013-09-09 DIAGNOSIS — L408 Other psoriasis: Secondary | ICD-10-CM | POA: Diagnosis not present

## 2013-09-09 DIAGNOSIS — L820 Inflamed seborrheic keratosis: Secondary | ICD-10-CM | POA: Diagnosis not present

## 2013-09-29 ENCOUNTER — Ambulatory Visit (INDEPENDENT_AMBULATORY_CARE_PROVIDER_SITE_OTHER): Payer: Medicare Other

## 2013-09-29 VITALS — BP 136/78 | HR 67 | Resp 12

## 2013-09-29 DIAGNOSIS — Q828 Other specified congenital malformations of skin: Secondary | ICD-10-CM

## 2013-09-29 DIAGNOSIS — B070 Plantar wart: Secondary | ICD-10-CM | POA: Diagnosis not present

## 2013-09-29 NOTE — Progress Notes (Signed)
   Subjective:    Patient ID: Ashley Griffith, female    DOB: 1945-07-30, 68 y.o.   MRN: [id number]  HPI PT STATED LT FOOT BALL OF THE FOOT HAVE A CALLUS AND IT HURTS FOR HALF A MONTH. THE FOOT IS GETTING WORSE. THE FOOT AGGRAVATED WHEN PUTTING PRESSURE ON IT AND TRIED NO TREATMENT.   Review of Systems  HENT: Positive for hearing loss.   Endocrine: Positive for heat intolerance.  Musculoskeletal: Positive for joint swelling.  Skin: Positive for rash.  Hematological: Bruises/bleeds easily.       Objective:   Physical Exam Neurovascular status is intact pedal pulses are palpable epicritic and proprioceptive sensations intact patient does have psoriatic arthritis with some weights healing a fish in the nails otherwise unremarkable there is a nucleated keratotic lesion sub-first MTP area of the left foot diffuse keratoses sub-second third metatarsal however there is no history of injury trauma patient does a lot of walking activities and stretching lesion well-circumscribed nucleated about 2 mm in diameter to orthopedic biomechanical exam unremarkable rectus foot type ankle mid tarsus subtalar joint which is normal pedal pulses are palpable and epicritic sensations intact no open wounds or ulcerations no secondary infections no       Assessment & Plan:  Assessment suspect porokeratosis versus verruca plantaris plantar right foot sub-first MTP area this lesion is debrided back and suggested daily application topical salicylic acid Aquasol instruction sheet is given for future use if there's any recurrence or exacerbations were used to Aquasol inducted under occlusion as instructed contact us if any new problems or difficulties occur with the next month or as needed  Harriet Masson DPM

## 2013-09-29 NOTE — Patient Instructions (Signed)

## 2013-09-30 DIAGNOSIS — L4050 Arthropathic psoriasis, unspecified: Secondary | ICD-10-CM | POA: Diagnosis not present

## 2013-10-13 DIAGNOSIS — Z7901 Long term (current) use of anticoagulants: Secondary | ICD-10-CM | POA: Diagnosis not present

## 2013-10-13 DIAGNOSIS — Z86711 Personal history of pulmonary embolism: Secondary | ICD-10-CM | POA: Diagnosis not present

## 2013-10-13 DIAGNOSIS — G479 Sleep disorder, unspecified: Secondary | ICD-10-CM | POA: Diagnosis not present

## 2013-10-13 DIAGNOSIS — L4050 Arthropathic psoriasis, unspecified: Secondary | ICD-10-CM | POA: Diagnosis not present

## 2013-10-13 DIAGNOSIS — F411 Generalized anxiety disorder: Secondary | ICD-10-CM | POA: Diagnosis not present

## 2013-10-13 DIAGNOSIS — Z0000 Encounter for general adult medical examination without abnormal findings: Secondary | ICD-10-CM | POA: Diagnosis not present

## 2013-10-13 DIAGNOSIS — E039 Hypothyroidism, unspecified: Secondary | ICD-10-CM | POA: Diagnosis not present

## 2013-10-13 DIAGNOSIS — Z23 Encounter for immunization: Secondary | ICD-10-CM | POA: Diagnosis not present

## 2013-10-13 DIAGNOSIS — E785 Hyperlipidemia, unspecified: Secondary | ICD-10-CM | POA: Diagnosis not present

## 2013-11-03 DIAGNOSIS — M159 Polyosteoarthritis, unspecified: Secondary | ICD-10-CM | POA: Diagnosis not present

## 2013-11-03 DIAGNOSIS — L4050 Arthropathic psoriasis, unspecified: Secondary | ICD-10-CM | POA: Diagnosis not present

## 2013-11-03 DIAGNOSIS — M25569 Pain in unspecified knee: Secondary | ICD-10-CM | POA: Diagnosis not present

## 2013-11-10 DIAGNOSIS — Z86711 Personal history of pulmonary embolism: Secondary | ICD-10-CM | POA: Diagnosis not present

## 2013-11-10 DIAGNOSIS — Z7901 Long term (current) use of anticoagulants: Secondary | ICD-10-CM | POA: Diagnosis not present

## 2013-11-14 DIAGNOSIS — L4050 Arthropathic psoriasis, unspecified: Secondary | ICD-10-CM | POA: Diagnosis not present

## 2013-12-08 DIAGNOSIS — Z7901 Long term (current) use of anticoagulants: Secondary | ICD-10-CM | POA: Diagnosis not present

## 2013-12-08 DIAGNOSIS — Z86711 Personal history of pulmonary embolism: Secondary | ICD-10-CM | POA: Diagnosis not present

## 2013-12-09 DIAGNOSIS — E0789 Other specified disorders of thyroid: Secondary | ICD-10-CM | POA: Diagnosis not present

## 2013-12-11 DIAGNOSIS — L408 Other psoriasis: Secondary | ICD-10-CM | POA: Diagnosis not present

## 2013-12-16 DIAGNOSIS — E0789 Other specified disorders of thyroid: Secondary | ICD-10-CM | POA: Diagnosis not present

## 2013-12-16 DIAGNOSIS — E789 Disorder of lipoprotein metabolism, unspecified: Secondary | ICD-10-CM | POA: Diagnosis not present

## 2013-12-16 DIAGNOSIS — L4050 Arthropathic psoriasis, unspecified: Secondary | ICD-10-CM | POA: Diagnosis not present

## 2013-12-30 DIAGNOSIS — K219 Gastro-esophageal reflux disease without esophagitis: Secondary | ICD-10-CM | POA: Diagnosis not present

## 2014-01-02 DIAGNOSIS — M25569 Pain in unspecified knee: Secondary | ICD-10-CM | POA: Diagnosis not present

## 2014-01-02 DIAGNOSIS — L4050 Arthropathic psoriasis, unspecified: Secondary | ICD-10-CM | POA: Diagnosis not present

## 2014-01-02 DIAGNOSIS — M159 Polyosteoarthritis, unspecified: Secondary | ICD-10-CM | POA: Diagnosis not present

## 2014-01-05 DIAGNOSIS — Z7901 Long term (current) use of anticoagulants: Secondary | ICD-10-CM | POA: Diagnosis not present

## 2014-01-05 DIAGNOSIS — Z86711 Personal history of pulmonary embolism: Secondary | ICD-10-CM | POA: Diagnosis not present

## 2014-01-22 DIAGNOSIS — M25561 Pain in right knee: Secondary | ICD-10-CM | POA: Diagnosis not present

## 2014-01-22 DIAGNOSIS — M1710 Unilateral primary osteoarthritis, unspecified knee: Secondary | ICD-10-CM | POA: Diagnosis not present

## 2014-01-22 DIAGNOSIS — L4050 Arthropathic psoriasis, unspecified: Secondary | ICD-10-CM | POA: Diagnosis not present

## 2014-01-30 DIAGNOSIS — Z23 Encounter for immunization: Secondary | ICD-10-CM | POA: Diagnosis not present

## 2014-02-02 DIAGNOSIS — Z7901 Long term (current) use of anticoagulants: Secondary | ICD-10-CM | POA: Diagnosis not present

## 2014-02-02 DIAGNOSIS — Z86711 Personal history of pulmonary embolism: Secondary | ICD-10-CM | POA: Diagnosis not present

## 2014-02-03 DIAGNOSIS — L4050 Arthropathic psoriasis, unspecified: Secondary | ICD-10-CM | POA: Diagnosis not present

## 2014-02-06 ENCOUNTER — Other Ambulatory Visit: Payer: Self-pay

## 2014-02-11 DIAGNOSIS — M7989 Other specified soft tissue disorders: Secondary | ICD-10-CM | POA: Diagnosis not present

## 2014-02-11 DIAGNOSIS — M1710 Unilateral primary osteoarthritis, unspecified knee: Secondary | ICD-10-CM | POA: Diagnosis not present

## 2014-02-11 DIAGNOSIS — L4050 Arthropathic psoriasis, unspecified: Secondary | ICD-10-CM | POA: Diagnosis not present

## 2014-02-17 DIAGNOSIS — L4050 Arthropathic psoriasis, unspecified: Secondary | ICD-10-CM | POA: Diagnosis not present

## 2014-03-02 DIAGNOSIS — Z86711 Personal history of pulmonary embolism: Secondary | ICD-10-CM | POA: Diagnosis not present

## 2014-03-02 DIAGNOSIS — Z7901 Long term (current) use of anticoagulants: Secondary | ICD-10-CM | POA: Diagnosis not present

## 2014-03-03 DIAGNOSIS — L4050 Arthropathic psoriasis, unspecified: Secondary | ICD-10-CM | POA: Diagnosis not present

## 2014-03-05 DIAGNOSIS — L4050 Arthropathic psoriasis, unspecified: Secondary | ICD-10-CM | POA: Diagnosis not present

## 2014-03-05 DIAGNOSIS — M1710 Unilateral primary osteoarthritis, unspecified knee: Secondary | ICD-10-CM | POA: Diagnosis not present

## 2014-03-06 DIAGNOSIS — J069 Acute upper respiratory infection, unspecified: Secondary | ICD-10-CM | POA: Diagnosis not present

## 2014-03-30 DIAGNOSIS — Z86711 Personal history of pulmonary embolism: Secondary | ICD-10-CM | POA: Diagnosis not present

## 2014-03-30 DIAGNOSIS — Z7901 Long term (current) use of anticoagulants: Secondary | ICD-10-CM | POA: Diagnosis not present

## 2014-03-31 DIAGNOSIS — M0589 Other rheumatoid arthritis with rheumatoid factor of multiple sites: Secondary | ICD-10-CM | POA: Diagnosis not present

## 2014-04-14 DIAGNOSIS — E785 Hyperlipidemia, unspecified: Secondary | ICD-10-CM | POA: Diagnosis not present

## 2014-04-14 DIAGNOSIS — L4050 Arthropathic psoriasis, unspecified: Secondary | ICD-10-CM | POA: Diagnosis not present

## 2014-04-14 DIAGNOSIS — Z86711 Personal history of pulmonary embolism: Secondary | ICD-10-CM | POA: Diagnosis not present

## 2014-04-14 DIAGNOSIS — F419 Anxiety disorder, unspecified: Secondary | ICD-10-CM | POA: Diagnosis not present

## 2014-04-14 DIAGNOSIS — E039 Hypothyroidism, unspecified: Secondary | ICD-10-CM | POA: Diagnosis not present

## 2014-04-15 DIAGNOSIS — M79643 Pain in unspecified hand: Secondary | ICD-10-CM | POA: Diagnosis not present

## 2014-04-15 DIAGNOSIS — L4050 Arthropathic psoriasis, unspecified: Secondary | ICD-10-CM | POA: Diagnosis not present

## 2014-04-15 DIAGNOSIS — M1710 Unilateral primary osteoarthritis, unspecified knee: Secondary | ICD-10-CM | POA: Diagnosis not present

## 2014-04-28 DIAGNOSIS — Z7901 Long term (current) use of anticoagulants: Secondary | ICD-10-CM | POA: Diagnosis not present

## 2014-04-28 DIAGNOSIS — I82409 Acute embolism and thrombosis of unspecified deep veins of unspecified lower extremity: Secondary | ICD-10-CM | POA: Diagnosis not present

## 2014-04-28 DIAGNOSIS — E785 Hyperlipidemia, unspecified: Secondary | ICD-10-CM | POA: Diagnosis not present

## 2014-05-26 DIAGNOSIS — Z86711 Personal history of pulmonary embolism: Secondary | ICD-10-CM | POA: Diagnosis not present

## 2014-05-26 DIAGNOSIS — Z7901 Long term (current) use of anticoagulants: Secondary | ICD-10-CM | POA: Diagnosis not present

## 2014-06-11 DIAGNOSIS — E0789 Other specified disorders of thyroid: Secondary | ICD-10-CM | POA: Diagnosis not present

## 2014-06-17 DIAGNOSIS — L4050 Arthropathic psoriasis, unspecified: Secondary | ICD-10-CM | POA: Diagnosis not present

## 2014-06-17 DIAGNOSIS — M1710 Unilateral primary osteoarthritis, unspecified knee: Secondary | ICD-10-CM | POA: Diagnosis not present

## 2014-06-18 DIAGNOSIS — E032 Hypothyroidism due to medicaments and other exogenous substances: Secondary | ICD-10-CM | POA: Diagnosis not present

## 2014-06-18 DIAGNOSIS — E789 Disorder of lipoprotein metabolism, unspecified: Secondary | ICD-10-CM | POA: Diagnosis not present

## 2014-06-23 DIAGNOSIS — Z86711 Personal history of pulmonary embolism: Secondary | ICD-10-CM | POA: Diagnosis not present

## 2014-06-23 DIAGNOSIS — Z7901 Long term (current) use of anticoagulants: Secondary | ICD-10-CM | POA: Diagnosis not present

## 2014-06-25 DIAGNOSIS — L4059 Other psoriatic arthropathy: Secondary | ICD-10-CM | POA: Diagnosis not present

## 2014-07-20 DIAGNOSIS — Z961 Presence of intraocular lens: Secondary | ICD-10-CM | POA: Diagnosis not present

## 2014-07-27 DIAGNOSIS — Z86711 Personal history of pulmonary embolism: Secondary | ICD-10-CM | POA: Diagnosis not present

## 2014-07-27 DIAGNOSIS — Z7901 Long term (current) use of anticoagulants: Secondary | ICD-10-CM | POA: Diagnosis not present

## 2014-08-03 DIAGNOSIS — Z7901 Long term (current) use of anticoagulants: Secondary | ICD-10-CM | POA: Diagnosis not present

## 2014-08-03 DIAGNOSIS — Z86711 Personal history of pulmonary embolism: Secondary | ICD-10-CM | POA: Diagnosis not present

## 2014-08-10 DIAGNOSIS — M1711 Unilateral primary osteoarthritis, right knee: Secondary | ICD-10-CM | POA: Diagnosis not present

## 2014-08-12 DIAGNOSIS — M1710 Unilateral primary osteoarthritis, unspecified knee: Secondary | ICD-10-CM | POA: Diagnosis not present

## 2014-08-12 DIAGNOSIS — L4050 Arthropathic psoriasis, unspecified: Secondary | ICD-10-CM | POA: Diagnosis not present

## 2014-09-07 ENCOUNTER — Encounter (HOSPITAL_COMMUNITY): Payer: Self-pay

## 2014-09-10 DIAGNOSIS — Z7901 Long term (current) use of anticoagulants: Secondary | ICD-10-CM | POA: Diagnosis not present

## 2014-09-10 DIAGNOSIS — Z86711 Personal history of pulmonary embolism: Secondary | ICD-10-CM | POA: Diagnosis not present

## 2014-09-14 ENCOUNTER — Encounter (HOSPITAL_COMMUNITY): Admission: RE | Admit: 2014-09-14 | Payer: Self-pay | Source: Ambulatory Visit

## 2014-09-14 DIAGNOSIS — L4059 Other psoriatic arthropathy: Secondary | ICD-10-CM | POA: Diagnosis not present

## 2014-09-23 DIAGNOSIS — Z86711 Personal history of pulmonary embolism: Secondary | ICD-10-CM | POA: Diagnosis not present

## 2014-09-23 DIAGNOSIS — Z7901 Long term (current) use of anticoagulants: Secondary | ICD-10-CM | POA: Diagnosis not present

## 2014-09-30 DIAGNOSIS — L4050 Arthropathic psoriasis, unspecified: Secondary | ICD-10-CM | POA: Diagnosis not present

## 2014-09-30 DIAGNOSIS — M1710 Unilateral primary osteoarthritis, unspecified knee: Secondary | ICD-10-CM | POA: Diagnosis not present

## 2014-10-05 ENCOUNTER — Encounter (HOSPITAL_COMMUNITY): Payer: Self-pay

## 2014-10-19 ENCOUNTER — Other Ambulatory Visit: Payer: Self-pay

## 2014-10-19 DIAGNOSIS — R7309 Other abnormal glucose: Secondary | ICD-10-CM | POA: Diagnosis not present

## 2014-10-19 DIAGNOSIS — Z7901 Long term (current) use of anticoagulants: Secondary | ICD-10-CM | POA: Diagnosis not present

## 2014-10-19 DIAGNOSIS — Z0000 Encounter for general adult medical examination without abnormal findings: Secondary | ICD-10-CM | POA: Diagnosis not present

## 2014-10-19 DIAGNOSIS — E039 Hypothyroidism, unspecified: Secondary | ICD-10-CM | POA: Diagnosis not present

## 2014-10-19 DIAGNOSIS — Z86711 Personal history of pulmonary embolism: Secondary | ICD-10-CM | POA: Diagnosis not present

## 2014-10-19 DIAGNOSIS — L4050 Arthropathic psoriasis, unspecified: Secondary | ICD-10-CM | POA: Diagnosis not present

## 2014-10-19 DIAGNOSIS — E785 Hyperlipidemia, unspecified: Secondary | ICD-10-CM | POA: Diagnosis not present

## 2014-10-19 DIAGNOSIS — Z7952 Long term (current) use of systemic steroids: Secondary | ICD-10-CM | POA: Diagnosis not present

## 2014-10-22 DIAGNOSIS — Z1231 Encounter for screening mammogram for malignant neoplasm of breast: Secondary | ICD-10-CM | POA: Diagnosis not present

## 2014-10-22 DIAGNOSIS — M8580 Other specified disorders of bone density and structure, unspecified site: Secondary | ICD-10-CM | POA: Diagnosis not present

## 2014-11-18 DIAGNOSIS — Z7901 Long term (current) use of anticoagulants: Secondary | ICD-10-CM | POA: Diagnosis not present

## 2014-11-18 DIAGNOSIS — Z86711 Personal history of pulmonary embolism: Secondary | ICD-10-CM | POA: Diagnosis not present

## 2014-11-30 ENCOUNTER — Encounter (HOSPITAL_COMMUNITY): Payer: Self-pay

## 2014-12-08 DIAGNOSIS — L409 Psoriasis, unspecified: Secondary | ICD-10-CM | POA: Diagnosis not present

## 2014-12-08 DIAGNOSIS — B356 Tinea cruris: Secondary | ICD-10-CM | POA: Diagnosis not present

## 2014-12-10 ENCOUNTER — Telehealth: Payer: Self-pay | Admitting: Hematology

## 2014-12-10 DIAGNOSIS — L4050 Arthropathic psoriasis, unspecified: Secondary | ICD-10-CM | POA: Diagnosis not present

## 2014-12-10 DIAGNOSIS — Z79899 Other long term (current) drug therapy: Secondary | ICD-10-CM | POA: Diagnosis not present

## 2014-12-10 DIAGNOSIS — L408 Other psoriasis: Secondary | ICD-10-CM | POA: Diagnosis not present

## 2014-12-10 DIAGNOSIS — R21 Rash and other nonspecific skin eruption: Secondary | ICD-10-CM | POA: Diagnosis not present

## 2014-12-10 DIAGNOSIS — E032 Hypothyroidism due to medicaments and other exogenous substances: Secondary | ICD-10-CM | POA: Diagnosis not present

## 2014-12-10 DIAGNOSIS — M1710 Unilateral primary osteoarthritis, unspecified knee: Secondary | ICD-10-CM | POA: Diagnosis not present

## 2014-12-10 NOTE — Telephone Encounter (Signed)
New patient appt-s/w patient and gave np appt for 08/24 @ 9 w/Dr. Irene Limbo.  Referring Dr. Harlene Ramus Dx-Personal hx of pe   Referral information scanned

## 2014-12-11 DIAGNOSIS — L309 Dermatitis, unspecified: Secondary | ICD-10-CM | POA: Diagnosis not present

## 2014-12-11 DIAGNOSIS — L718 Other rosacea: Secondary | ICD-10-CM | POA: Diagnosis not present

## 2014-12-12 DIAGNOSIS — N898 Other specified noninflammatory disorders of vagina: Secondary | ICD-10-CM | POA: Diagnosis not present

## 2014-12-16 ENCOUNTER — Encounter: Payer: Self-pay | Admitting: Hematology

## 2014-12-16 ENCOUNTER — Ambulatory Visit: Payer: Medicare Other

## 2014-12-16 ENCOUNTER — Other Ambulatory Visit: Payer: Medicare Other

## 2014-12-16 ENCOUNTER — Ambulatory Visit (HOSPITAL_BASED_OUTPATIENT_CLINIC_OR_DEPARTMENT_OTHER): Payer: Medicare Other | Admitting: Hematology

## 2014-12-16 VITALS — BP 150/77 | HR 64 | Temp 97.9°F | Resp 18 | Ht <= 58 in | Wt 161.5 lb

## 2014-12-16 DIAGNOSIS — Z86718 Personal history of other venous thrombosis and embolism: Secondary | ICD-10-CM | POA: Diagnosis not present

## 2014-12-16 DIAGNOSIS — Z86711 Personal history of pulmonary embolism: Secondary | ICD-10-CM

## 2014-12-16 DIAGNOSIS — Z7901 Long term (current) use of anticoagulants: Secondary | ICD-10-CM

## 2014-12-16 DIAGNOSIS — E039 Hypothyroidism, unspecified: Secondary | ICD-10-CM | POA: Diagnosis not present

## 2014-12-16 DIAGNOSIS — I8290 Acute embolism and thrombosis of unspecified vein: Secondary | ICD-10-CM

## 2014-12-16 DIAGNOSIS — B356 Tinea cruris: Secondary | ICD-10-CM | POA: Diagnosis not present

## 2014-12-16 DIAGNOSIS — L409 Psoriasis, unspecified: Secondary | ICD-10-CM | POA: Diagnosis not present

## 2014-12-16 DIAGNOSIS — R791 Abnormal coagulation profile: Secondary | ICD-10-CM | POA: Diagnosis not present

## 2014-12-16 NOTE — Progress Notes (Signed)
Checked in patient with no financial concerns. Gave my card for any financial questions or concerns.

## 2014-12-17 DIAGNOSIS — Z7901 Long term (current) use of anticoagulants: Secondary | ICD-10-CM | POA: Diagnosis not present

## 2014-12-17 DIAGNOSIS — E041 Nontoxic single thyroid nodule: Secondary | ICD-10-CM | POA: Diagnosis not present

## 2014-12-17 DIAGNOSIS — Z86711 Personal history of pulmonary embolism: Secondary | ICD-10-CM | POA: Diagnosis not present

## 2014-12-18 ENCOUNTER — Encounter: Payer: Self-pay | Admitting: Hematology

## 2014-12-18 DIAGNOSIS — Z7901 Long term (current) use of anticoagulants: Secondary | ICD-10-CM | POA: Diagnosis not present

## 2014-12-18 DIAGNOSIS — Z86711 Personal history of pulmonary embolism: Secondary | ICD-10-CM | POA: Diagnosis not present

## 2014-12-18 NOTE — Progress Notes (Signed)
When transitioning from Coumadin to Rivaroxaban. If INR is therapeutic we'll hold 1 dose of Coumadin and start the Rivaroxaban at the maintenance dose of 20 mg by mouth daily on day 2 at the same time instead of the Coumadin. 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  Sullivan Lone MD [place] AAHIVMS [facility] Hematology/Oncology Physician [place]  (Office):       [phone] (Work cell):  [phone] (Fax):           [phone]

## 2014-12-18 NOTE — Progress Notes (Signed)
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  HISTORY OF PRESENTING ILLNESS:   Ashley Griffith is a wonderful 69 y.o. female who has been referred to [place] by Dr .Milagros Evener, MD  for evaluation and management of her recurrent VTE (remote history) and specifically for anticoagulation recommendations as the patient wants to consider newer anticoagulants instead of her chronic Coumadin.  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 She notes that she was treated with Coumadin for a year and was off hormone therapy since the first blood clot. She notes that she developed a DVT in her lower extremity after being off Coumadin for a couple of months. She was restarted on Coumadin and has been on lifelong Coumadin since. She notes she has never had any previous thrombophilia workup. There was never a discussion of trying to transition to aspirin. She notes that she has not had any additional venous thromboembolic events since being on Coumadin. No major bleeding on Coumadin. No additional DVT/PE despite several surgical procedures.  Patient does note that she has intermittent rectal bleeding which is thought to be from her hemorrhoids. She notes that she is up to speed with her colonoscopy.  Patient has been on several lines of treatment for her psoriatic arthropathy and has been frustrated by the inability to use NSAIDs due to the increased risk of bleeding with Coumadin. She seeks alternative options for anticoagulation and  consideration of newer medications.  She notes that her INRs have been fairly stable with Coumadin. She notes that she dislikes the dietary limitations, ongoing recurrent blood testing for INR monitoring and the fact that it interacts with multiple medications.  She denies ever having any kidney or liver problems. No CVAs or CNS bleeds.  We discussed at length pros and cons of Coumadin versus Rivaroxaban . And all her questions and concerns were addressed.  No chest pain/acute shortness of breath or leg swelling at this time. No symptoms suggestive of post thrombotic syndrome.  MEDICAL HISTORY:  Past Medical History  Diagnosis Date  . Pulmonary embolism march 1995  . Blood clot in vein     leg 11-23-1004  . GERD (gastroesophageal reflux disease)   . Arthritis   . PONV (postoperative nausea and vomiting)   . Depression   . Rosacea   . Psoriatic arthritis     Follows with Dr. Naida Sleight  . Hypothyroidism     Seen by Dr. Wilson Singer  . Obesity     Status post significant intentional weight loss in 2003 of > 90 pounds  . Hemorrhoids     Patient notes intermittent rectal bleeding from these    SURGICAL HISTORY: Past Surgical History  Procedure Laterality Date  . Tonsillectomy  as child  . Nasal poly removed  age 16  . Blood mass removed from abdomen  1972    ? questionable ectopic pregnancy  . Childbirth  1976  . Sphincterotomy  1990's  . Colonscopy      x 2  . Sclorotomy both legs veins  2005 or 2006  . Knee arthroscopy Right 09/18/2012  Procedure: RIGHT ARTHROSCOPY KNEE, Tlateral meniscal debdridement and chondroplasty;  Surgeon: Gearlean Alf, MD;  Location: [facility];  Service: Orthopedics;  Laterality: Right;    SOCIAL HISTORY: Social History   Social History  . Marital Status: Married    Spouse Name: N/A  . Number of Children: N/A  . Years of Education: N/A   Occupational History  . Not on file.   Social History Main Topics  . Smoking status: Former Smoker -- 1.00  packs/day for 5 years    Types: Cigarettes    Quit date: 04/24/1973  . Smokeless tobacco: Never Used  . Alcohol Use: Yes     Comment: social  . Drug Use: No  . Sexual Activity: Not on file   Other Topics Concern  . Not on file   Social History Narrative    FAMILY HISTORY: Family History  Problem Relation Age of Onset  . Kidney failure Mother     At age 79  . Heart attack Father     At age 7  . Acute myelogenous leukemia Brother     At age 83  . Cancer Maternal Aunt     Breast cancer    ALLERGIES:  is allergic to erythromycin; monistat; penicillins; thimerosal; and adhesive.  MEDICATIONS:  Current Outpatient Prescriptions  Medication Sig Dispense Refill  . acetaminophen (TYLENOL) 325 MG tablet Take 650 mg by mouth continuous as needed.    Marland Kitchen atorvastatin (LIPITOR) 10 MG tablet     . buPROPion (WELLBUTRIN XL) 150 MG 24 hr tablet Take 150 mg by mouth daily before breakfast.    . Calcium Carbonate Antacid (MAALOX) 600 MG chewable tablet Chew 1,800 mg by mouth at bedtime.    . Calcium Citrate-Vitamin D (CALCIUM CITRATE + D PO) Take 1 tablet by mouth 2 (two) times daily.    . clindamycin (CLINDAGEL) 1 % gel Apply 1 application topically 2 (two) times daily.    . fluticasone (FLONASE) 50 MCG/ACT nasal spray Place 1 spray into the nose daily as needed for allergies.    . folic acid (FOLVITE) 270 MCG tablet Take 400 mcg by mouth daily.    Marland Kitchen levothyroxine (SYNTHROID, LEVOTHROID) 50 MCG tablet Take 88 mcg by mouth daily before breakfast.     . methotrexate (RHEUMATREX) 2.5 MG tablet 10 mg. Wednesday mornings    . omega-3 acid ethyl esters (LOVAZA) 1 G capsule Take by mouth 2 (two) times daily.    . pimecrolimus (ELIDEL) 1 % cream Apply topically 2 (two) times daily.    . predniSONE (DELTASONE) 5 MG tablet Take 5 mg by mouth daily with breakfast.    . warfarin (COUMADIN) 4 MG tablet Take 1 tablet (4 mg total) by mouth daily before breakfast.     No current facility-administered  medications for this visit.    REVIEW OF SYSTEMS:    10 Point review of Systems was done is negative except as noted above.  PHYSICAL EXAMINATION: ECOG PERFORMANCE STATUS: 1 - Symptomatic but completely ambulatory  . Filed Vitals:   12/16/14 0915  Height: 4\' 7"  (1.397 m)  Weight: 161 lb 8 oz (73.256 kg)   Filed Weights   12/16/14 0915  Weight: 161 lb 8 oz (73.256 kg)   .Body mass index is 37.54 kg/(m^2).  GENERAL:alert, in no acute distress and comfortable SKIN: skin color, texture, turgor are normal,no acute rashes  EYES: normal, conjunctiva are pink and non-injected, sclera clear OROPHARYNX:no exudate, no erythema and lips, buccal mucosa, and tongue normal  NECK: supple, no JVD, thyroid normal size, non-tender, without nodularity LYMPH:  no palpable lymphadenopathy in the cervical, axillary or inguinal LUNGS: clear to auscultation with normal respiratory effort HEART: regular rate & rhythm,  no murmurs and no lower extremity edema ABDOMEN: abdomen soft, non-tender, normoactive bowel sounds, No hepatosplenomegaly. Musculoskeletal: no cyanosis of digits and no clubbing , Multiple varicose veins on her lower extremities . PSYCH: alert & oriented x 3 with fluent speech NEURO: no focal motor/sensory deficits  LABORATORY DATA:   .No flowsheet data found.  .No flowsheet data found.  Labs from her primary care physician were reviewed in the scanned media.  RADIOGRAPHIC STUDIES: I have personally reviewed the radiological images as listed and agreed with the findings in the report. No results found.  ASSESSMENT & PLAN:   69 year old Caucasian female with  #1 Remote history of pulmonary embolism in March 1995 provoked with hormonal therapy treated with 1 year of Coumadin. Had a right lower extremity DVT off Coumadin in1996 which was considered to be unprovoked and patient has been on lifelong warfarin since then. INRs have been stable. No major bleeding episodes with  warfarin. Has intermittent rectal bleeding supposedly from hemorrhoids. #2 started arthropathy #3 hypothyroidism #4 depression #5 Plan -We discussed the risks and benefits and the anticoagulation versus switching to aspirin. -She was recommended discussing with her primary care physician consideration for referral to colorectal surgery to take care of her hemorrhoids which appear to be bleeding on and off. -We discussed the pros and cons of Coumadin and Rivaroxaban. Patient understands that there are additional dietary restrictions, monitoring requirements and more frequent dose adjustments needed with Coumadin therapy. Coumadin therapy does have specific reversible antidote's including vitamin K/FFP and [place] in the setting of life-threatening bleeding. -Rivaroxaban has fairly stable pharmacokinetics does not need specific monitoring, does not need specific dietary modifications and potentially has a slightly lower risk of CNS bleeds than Coumadin . -She is over the Rivaroxaban does not have a specific reversible antidote the setting of major bleeding though people use a right common factor VII and possibly a [place] in the setting of life-threatening bleeds. -NSAIDs were increase the risk of bleeding with both these medications. Along with Rivaroxaban predominantly due to its independent effect on platelet function . With Coumadin both by its antiplatelet activity and also by displacing Coumadin from its protein binding to increase Coumadin levels. -If a decision was made to transition from Coumadin to Rivaroxaban would hold Coumadin for one day and start Rivaroxaban the following day provided the initial INRs were within therapeutic range of 2-3. -She was given a patient handout regarding Rivaroxaban and a print out of all the potential drug interactions with this medication for her education. -She was also counseled about the fact that Rivaroxaban might have a tendency for rebound thrombosis if one were  noncompliant with it and that Coumadin might be a little more forgiving since the anticoagulation does not switch off suddenly.  -Patient notes that she will finalize the decision with her primary care physician and ask her for the Rivaroxaban prescription if she decides to go with that.  -Given the patient's absence of kidney and liver issues and no obvious major bleeding Rivaroxaban might be a reasonable option. A wide strong cyp3A4 inhibitors and inducers since these might interact with the Rivaroxaban . -If in the future the patient decides to get off therapeutic anticoagulation would get a baseline d-dimer and if normal one could transition to aspirin for 4 weeks and  recheck d-dimer in 4 weeks to risk stratify this patient for recurrent VTE. If d-dimer remains normal could continue aspirin if it goes up might consider restarting anticoagulation. In this situation would consider therapeutic anticoagulation for DVT prophylaxis in high risk situations such a surgery or prolonged immobilization.  Follow-up with primary care physician. Please Re-consult as needed if any new questions or concerns arise.  All of the patients questions were answered with apparent satisfaction. The patient knows to call the clinic with any problems, questions or concerns.  I spent 30 minutes counseling the patient face to face. The total time spent in the appointment was 45 minutes and more than 50% was on counseling and direct patient cares.    Sullivan Lone MD [place] AAHIVMS [facility] Hematology/Oncology Physician [facility]  (Office):       [phone] (Work cell):  [phone] (Fax):           [phone]  12/18/2014 5:51 PM

## 2014-12-21 DIAGNOSIS — Z7901 Long term (current) use of anticoagulants: Secondary | ICD-10-CM | POA: Diagnosis not present

## 2014-12-21 DIAGNOSIS — Z86711 Personal history of pulmonary embolism: Secondary | ICD-10-CM | POA: Diagnosis not present

## 2014-12-24 DIAGNOSIS — D224 Melanocytic nevi of scalp and neck: Secondary | ICD-10-CM | POA: Diagnosis not present

## 2014-12-24 DIAGNOSIS — L2089 Other atopic dermatitis: Secondary | ICD-10-CM | POA: Diagnosis not present

## 2014-12-24 DIAGNOSIS — L821 Other seborrheic keratosis: Secondary | ICD-10-CM | POA: Diagnosis not present

## 2014-12-29 DIAGNOSIS — Z86711 Personal history of pulmonary embolism: Secondary | ICD-10-CM | POA: Diagnosis not present

## 2014-12-29 DIAGNOSIS — Z7901 Long term (current) use of anticoagulants: Secondary | ICD-10-CM | POA: Diagnosis not present

## 2014-12-30 DIAGNOSIS — L409 Psoriasis, unspecified: Secondary | ICD-10-CM | POA: Diagnosis not present

## 2015-01-04 DIAGNOSIS — H02052 Trichiasis without entropian right lower eyelid: Secondary | ICD-10-CM | POA: Diagnosis not present

## 2015-01-20 DIAGNOSIS — L259 Unspecified contact dermatitis, unspecified cause: Secondary | ICD-10-CM | POA: Diagnosis not present

## 2015-01-20 DIAGNOSIS — L409 Psoriasis, unspecified: Secondary | ICD-10-CM | POA: Diagnosis not present

## 2015-01-20 DIAGNOSIS — N898 Other specified noninflammatory disorders of vagina: Secondary | ICD-10-CM | POA: Diagnosis not present

## 2015-01-28 DIAGNOSIS — Z86711 Personal history of pulmonary embolism: Secondary | ICD-10-CM | POA: Diagnosis not present

## 2015-01-28 DIAGNOSIS — L308 Other specified dermatitis: Secondary | ICD-10-CM | POA: Diagnosis not present

## 2015-01-28 DIAGNOSIS — Z7901 Long term (current) use of anticoagulants: Secondary | ICD-10-CM | POA: Diagnosis not present

## 2015-01-28 DIAGNOSIS — L400 Psoriasis vulgaris: Secondary | ICD-10-CM | POA: Diagnosis not present

## 2015-01-28 DIAGNOSIS — L270 Generalized skin eruption due to drugs and medicaments taken internally: Secondary | ICD-10-CM | POA: Diagnosis not present

## 2015-01-29 DIAGNOSIS — L408 Other psoriasis: Secondary | ICD-10-CM | POA: Diagnosis not present

## 2015-01-29 DIAGNOSIS — L4050 Arthropathic psoriasis, unspecified: Secondary | ICD-10-CM | POA: Diagnosis not present

## 2015-01-29 DIAGNOSIS — Z79899 Other long term (current) drug therapy: Secondary | ICD-10-CM | POA: Diagnosis not present

## 2015-01-29 DIAGNOSIS — M1710 Unilateral primary osteoarthritis, unspecified knee: Secondary | ICD-10-CM | POA: Diagnosis not present

## 2015-01-29 DIAGNOSIS — R21 Rash and other nonspecific skin eruption: Secondary | ICD-10-CM | POA: Diagnosis not present

## 2015-02-11 DIAGNOSIS — L309 Dermatitis, unspecified: Secondary | ICD-10-CM | POA: Diagnosis not present

## 2015-02-18 DIAGNOSIS — Z23 Encounter for immunization: Secondary | ICD-10-CM | POA: Diagnosis not present

## 2015-02-24 DIAGNOSIS — Z86711 Personal history of pulmonary embolism: Secondary | ICD-10-CM | POA: Diagnosis not present

## 2015-02-24 DIAGNOSIS — Z7901 Long term (current) use of anticoagulants: Secondary | ICD-10-CM | POA: Diagnosis not present

## 2015-03-04 DIAGNOSIS — L400 Psoriasis vulgaris: Secondary | ICD-10-CM | POA: Diagnosis not present

## 2015-03-04 DIAGNOSIS — L720 Epidermal cyst: Secondary | ICD-10-CM | POA: Diagnosis not present

## 2015-03-04 DIAGNOSIS — L738 Other specified follicular disorders: Secondary | ICD-10-CM | POA: Diagnosis not present

## 2015-03-12 DIAGNOSIS — L408 Other psoriasis: Secondary | ICD-10-CM | POA: Diagnosis not present

## 2015-03-12 DIAGNOSIS — R21 Rash and other nonspecific skin eruption: Secondary | ICD-10-CM | POA: Diagnosis not present

## 2015-03-12 DIAGNOSIS — Z79899 Other long term (current) drug therapy: Secondary | ICD-10-CM | POA: Diagnosis not present

## 2015-03-12 DIAGNOSIS — L4050 Arthropathic psoriasis, unspecified: Secondary | ICD-10-CM | POA: Diagnosis not present

## 2015-03-12 DIAGNOSIS — M1710 Unilateral primary osteoarthritis, unspecified knee: Secondary | ICD-10-CM | POA: Diagnosis not present

## 2015-03-30 DIAGNOSIS — Z86711 Personal history of pulmonary embolism: Secondary | ICD-10-CM | POA: Diagnosis not present

## 2015-03-30 DIAGNOSIS — L4050 Arthropathic psoriasis, unspecified: Secondary | ICD-10-CM | POA: Diagnosis not present

## 2015-03-30 DIAGNOSIS — Z7901 Long term (current) use of anticoagulants: Secondary | ICD-10-CM | POA: Diagnosis not present

## 2015-04-05 DIAGNOSIS — Z7901 Long term (current) use of anticoagulants: Secondary | ICD-10-CM | POA: Diagnosis not present

## 2015-04-05 DIAGNOSIS — Z86711 Personal history of pulmonary embolism: Secondary | ICD-10-CM | POA: Diagnosis not present

## 2015-05-05 DIAGNOSIS — M1711 Unilateral primary osteoarthritis, right knee: Secondary | ICD-10-CM | POA: Diagnosis not present

## 2015-05-05 DIAGNOSIS — Z4789 Encounter for other orthopedic aftercare: Secondary | ICD-10-CM | POA: Diagnosis not present

## 2015-05-07 DIAGNOSIS — L821 Other seborrheic keratosis: Secondary | ICD-10-CM | POA: Diagnosis not present

## 2015-05-07 DIAGNOSIS — D2272 Melanocytic nevi of left lower limb, including hip: Secondary | ICD-10-CM | POA: Diagnosis not present

## 2015-05-12 DIAGNOSIS — R21 Rash and other nonspecific skin eruption: Secondary | ICD-10-CM | POA: Diagnosis not present

## 2015-05-12 DIAGNOSIS — Z79899 Other long term (current) drug therapy: Secondary | ICD-10-CM | POA: Diagnosis not present

## 2015-05-12 DIAGNOSIS — M1710 Unilateral primary osteoarthritis, unspecified knee: Secondary | ICD-10-CM | POA: Diagnosis not present

## 2015-05-12 DIAGNOSIS — L408 Other psoriasis: Secondary | ICD-10-CM | POA: Diagnosis not present

## 2015-05-12 DIAGNOSIS — L4050 Arthropathic psoriasis, unspecified: Secondary | ICD-10-CM | POA: Diagnosis not present

## 2015-05-13 DIAGNOSIS — M9905 Segmental and somatic dysfunction of pelvic region: Secondary | ICD-10-CM | POA: Diagnosis not present

## 2015-05-13 DIAGNOSIS — M9903 Segmental and somatic dysfunction of lumbar region: Secondary | ICD-10-CM | POA: Diagnosis not present

## 2015-05-13 DIAGNOSIS — M9902 Segmental and somatic dysfunction of thoracic region: Secondary | ICD-10-CM | POA: Diagnosis not present

## 2015-05-13 DIAGNOSIS — M791 Myalgia: Secondary | ICD-10-CM | POA: Diagnosis not present

## 2015-05-13 DIAGNOSIS — M47814 Spondylosis without myelopathy or radiculopathy, thoracic region: Secondary | ICD-10-CM | POA: Diagnosis not present

## 2015-05-13 DIAGNOSIS — M4716 Other spondylosis with myelopathy, lumbar region: Secondary | ICD-10-CM | POA: Diagnosis not present

## 2015-05-17 DIAGNOSIS — M4716 Other spondylosis with myelopathy, lumbar region: Secondary | ICD-10-CM | POA: Diagnosis not present

## 2015-05-17 DIAGNOSIS — M791 Myalgia: Secondary | ICD-10-CM | POA: Diagnosis not present

## 2015-05-17 DIAGNOSIS — M9905 Segmental and somatic dysfunction of pelvic region: Secondary | ICD-10-CM | POA: Diagnosis not present

## 2015-05-17 DIAGNOSIS — M47814 Spondylosis without myelopathy or radiculopathy, thoracic region: Secondary | ICD-10-CM | POA: Diagnosis not present

## 2015-05-17 DIAGNOSIS — M9903 Segmental and somatic dysfunction of lumbar region: Secondary | ICD-10-CM | POA: Diagnosis not present

## 2015-05-17 DIAGNOSIS — M9902 Segmental and somatic dysfunction of thoracic region: Secondary | ICD-10-CM | POA: Diagnosis not present

## 2015-05-20 DIAGNOSIS — M9903 Segmental and somatic dysfunction of lumbar region: Secondary | ICD-10-CM | POA: Diagnosis not present

## 2015-05-20 DIAGNOSIS — M47814 Spondylosis without myelopathy or radiculopathy, thoracic region: Secondary | ICD-10-CM | POA: Diagnosis not present

## 2015-05-20 DIAGNOSIS — M791 Myalgia: Secondary | ICD-10-CM | POA: Diagnosis not present

## 2015-05-20 DIAGNOSIS — M4716 Other spondylosis with myelopathy, lumbar region: Secondary | ICD-10-CM | POA: Diagnosis not present

## 2015-05-20 DIAGNOSIS — M9902 Segmental and somatic dysfunction of thoracic region: Secondary | ICD-10-CM | POA: Diagnosis not present

## 2015-05-20 DIAGNOSIS — M9905 Segmental and somatic dysfunction of pelvic region: Secondary | ICD-10-CM | POA: Diagnosis not present

## 2015-05-31 DIAGNOSIS — M5136 Other intervertebral disc degeneration, lumbar region: Secondary | ICD-10-CM | POA: Diagnosis not present

## 2015-05-31 DIAGNOSIS — M545 Low back pain: Secondary | ICD-10-CM | POA: Diagnosis not present

## 2015-06-15 DIAGNOSIS — E0789 Other specified disorders of thyroid: Secondary | ICD-10-CM | POA: Diagnosis not present

## 2015-06-17 DIAGNOSIS — M1711 Unilateral primary osteoarthritis, right knee: Secondary | ICD-10-CM | POA: Diagnosis not present

## 2015-06-17 DIAGNOSIS — M5136 Other intervertebral disc degeneration, lumbar region: Secondary | ICD-10-CM | POA: Diagnosis not present

## 2015-06-22 DIAGNOSIS — E032 Hypothyroidism due to medicaments and other exogenous substances: Secondary | ICD-10-CM | POA: Diagnosis not present

## 2015-06-23 ENCOUNTER — Other Ambulatory Visit: Payer: Self-pay | Admitting: Orthopedic Surgery

## 2015-06-23 DIAGNOSIS — M5136 Other intervertebral disc degeneration, lumbar region: Secondary | ICD-10-CM

## 2015-06-25 DIAGNOSIS — M1711 Unilateral primary osteoarthritis, right knee: Secondary | ICD-10-CM | POA: Diagnosis not present

## 2015-07-02 DIAGNOSIS — M1711 Unilateral primary osteoarthritis, right knee: Secondary | ICD-10-CM | POA: Diagnosis not present

## 2015-07-03 ENCOUNTER — Ambulatory Visit
Admission: RE | Admit: 2015-07-03 | Discharge: 2015-07-03 | Disposition: A | Discharge status: Home / Self Care | Payer: Medicare Other | Source: Ambulatory Visit | Attending: Orthopedic Surgery | Admitting: Orthopedic Surgery

## 2015-07-03 DIAGNOSIS — M5136 Other intervertebral disc degeneration, lumbar region: Secondary | ICD-10-CM

## 2015-07-03 DIAGNOSIS — M51369 Other intervertebral disc degeneration, lumbar region without mention of lumbar back pain or lower extremity pain: Secondary | ICD-10-CM

## 2015-07-03 DIAGNOSIS — M4806 Spinal stenosis, lumbar region: Secondary | ICD-10-CM | POA: Diagnosis not present

## 2015-07-06 DIAGNOSIS — L4050 Arthropathic psoriasis, unspecified: Secondary | ICD-10-CM | POA: Diagnosis not present

## 2015-07-06 DIAGNOSIS — M170 Bilateral primary osteoarthritis of knee: Secondary | ICD-10-CM | POA: Diagnosis not present

## 2015-07-06 DIAGNOSIS — Z7901 Long term (current) use of anticoagulants: Secondary | ICD-10-CM | POA: Diagnosis not present

## 2015-07-06 DIAGNOSIS — Z86711 Personal history of pulmonary embolism: Secondary | ICD-10-CM | POA: Diagnosis not present

## 2015-07-10 DIAGNOSIS — M4697 Unspecified inflammatory spondylopathy, lumbosacral region: Secondary | ICD-10-CM | POA: Diagnosis not present

## 2015-08-10 DIAGNOSIS — R21 Rash and other nonspecific skin eruption: Secondary | ICD-10-CM | POA: Diagnosis not present

## 2015-08-10 DIAGNOSIS — Z79899 Other long term (current) drug therapy: Secondary | ICD-10-CM | POA: Diagnosis not present

## 2015-08-10 DIAGNOSIS — M1710 Unilateral primary osteoarthritis, unspecified knee: Secondary | ICD-10-CM | POA: Diagnosis not present

## 2015-08-10 DIAGNOSIS — L4050 Arthropathic psoriasis, unspecified: Secondary | ICD-10-CM | POA: Diagnosis not present

## 2015-08-10 DIAGNOSIS — L408 Other psoriasis: Secondary | ICD-10-CM | POA: Diagnosis not present

## 2015-08-13 DIAGNOSIS — M1711 Unilateral primary osteoarthritis, right knee: Secondary | ICD-10-CM | POA: Diagnosis not present

## 2015-08-25 DIAGNOSIS — Z6829 Body mass index (BMI) 29.0-29.9, adult: Secondary | ICD-10-CM | POA: Diagnosis not present

## 2015-08-25 DIAGNOSIS — R030 Elevated blood-pressure reading, without diagnosis of hypertension: Secondary | ICD-10-CM | POA: Diagnosis not present

## 2015-08-25 DIAGNOSIS — M9983 Other biomechanical lesions of lumbar region: Secondary | ICD-10-CM | POA: Diagnosis not present

## 2015-08-25 DIAGNOSIS — M5416 Radiculopathy, lumbar region: Secondary | ICD-10-CM | POA: Diagnosis not present

## 2015-08-25 DIAGNOSIS — M7130 Other bursal cyst, unspecified site: Secondary | ICD-10-CM | POA: Diagnosis not present

## 2015-08-26 DIAGNOSIS — H10503 Unspecified blepharoconjunctivitis, bilateral: Secondary | ICD-10-CM | POA: Diagnosis not present

## 2015-08-31 DIAGNOSIS — Z86711 Personal history of pulmonary embolism: Secondary | ICD-10-CM | POA: Diagnosis not present

## 2015-08-31 DIAGNOSIS — I8393 Asymptomatic varicose veins of bilateral lower extremities: Secondary | ICD-10-CM | POA: Diagnosis not present

## 2015-08-31 DIAGNOSIS — F419 Anxiety disorder, unspecified: Secondary | ICD-10-CM | POA: Diagnosis not present

## 2015-08-31 DIAGNOSIS — L4050 Arthropathic psoriasis, unspecified: Secondary | ICD-10-CM | POA: Diagnosis not present

## 2015-08-31 DIAGNOSIS — Z7901 Long term (current) use of anticoagulants: Secondary | ICD-10-CM | POA: Diagnosis not present

## 2015-08-31 DIAGNOSIS — G479 Sleep disorder, unspecified: Secondary | ICD-10-CM | POA: Diagnosis not present

## 2015-09-07 NOTE — Pre-Procedure Instructions (Signed)
Ashley Griffith  09/07/2015      [id number] - [place] - [address] AT [address] [place] [address] Phone: [phone] Fax: [phone]  [facility] [address], [place] - [id number] [place] [address] Phone: [phone] Fax: [phone]  [facility] [place] [address] Phone: [phone] Fax: [phone]    Your procedure is scheduled on  Thursday, May 25th   Report to [facility] Admitting at 9:00 AM              (Posted surgery time 12:00 noon to 2:05 pm)   Call this number if you have problems the morning of surgery:  [phone]   Remember:  Do not eat food or drink liquids after midnight Wednesday.   Take these medicines the morning of surgery with A SIP OF WATER : Synthroid  Stop taking your Xarelto as instructed by your doctor             4-5 days prior to surgery, STOP taking any aspirin, herbal supplements, vitamins, omega-3 anti-inflammatories.   Do not wear jewelry, make-up or nail polish.  Do not wear lotions, powders, or perfumes.     Do not shave 48 hours prior to surgery.    Do not bring valuables to the hospital.  [facility] is not responsible for any belongings or valuables.  Contacts, dentures or bridgework may not be worn into surgery.  Leave your suitcase in the car.  After surgery it may be brought to your room.   Please read over the following fact sheets that you were given. Mupirocin Application instructions, CHG Shower

## 2015-09-08 ENCOUNTER — Other Ambulatory Visit: Payer: Self-pay

## 2015-09-08 ENCOUNTER — Encounter (HOSPITAL_COMMUNITY)
Admission: RE | Admit: 2015-09-08 | Discharge: 2015-09-08 | Disposition: A | Discharge status: Home / Self Care | Payer: Medicare Other | Source: Ambulatory Visit | Attending: Neurosurgery | Admitting: Neurosurgery

## 2015-09-08 ENCOUNTER — Encounter (HOSPITAL_COMMUNITY): Payer: Self-pay

## 2015-09-08 ENCOUNTER — Other Ambulatory Visit (HOSPITAL_COMMUNITY): Payer: Self-pay | Admitting: *Deleted

## 2015-09-08 DIAGNOSIS — Z01818 Encounter for other preprocedural examination: Secondary | ICD-10-CM | POA: Diagnosis not present

## 2015-09-08 DIAGNOSIS — M4806 Spinal stenosis, lumbar region: Secondary | ICD-10-CM | POA: Diagnosis not present

## 2015-09-08 DIAGNOSIS — R001 Bradycardia, unspecified: Secondary | ICD-10-CM | POA: Diagnosis not present

## 2015-09-08 DIAGNOSIS — Z01812 Encounter for preprocedural laboratory examination: Secondary | ICD-10-CM | POA: Diagnosis not present

## 2015-09-08 DIAGNOSIS — R9431 Abnormal electrocardiogram [ECG] [EKG]: Secondary | ICD-10-CM | POA: Insufficient documentation

## 2015-09-08 LAB — BASIC METABOLIC PANEL
Anion gap: 11 (ref 5–15)
BUN: 18 mg/dL (ref 6–20)
CALCIUM: 9.9 mg/dL (ref 8.9–10.3)
CO2: 28 mmol/L (ref 22–32)
CREATININE: 0.73 mg/dL (ref 0.44–1.00)
Chloride: 102 mmol/L (ref 101–111)
Glucose, Bld: 82 mg/dL (ref 65–99)
Potassium: 4.6 mmol/L (ref 3.5–5.1)
SODIUM: 141 mmol/L (ref 135–145)

## 2015-09-08 LAB — CBC
HCT: 41.2 % (ref 36.0–46.0)
Hemoglobin: 14.1 g/dL (ref 12.0–15.0)
MCH: 33.4 pg (ref 26.0–34.0)
MCHC: 34.2 g/dL (ref 30.0–36.0)
MCV: 97.6 fL (ref 78.0–100.0)
PLATELETS: 240 10*3/uL (ref 150–400)
RBC: 4.22 MIL/uL (ref 3.87–5.11)
RDW: 13.4 % (ref 11.5–15.5)
WBC: 5.5 10*3/uL (ref 4.0–10.5)

## 2015-09-08 LAB — SURGICAL PCR SCREEN
MRSA, PCR: NEGATIVE
Staphylococcus aureus: NEGATIVE

## 2015-09-08 NOTE — Progress Notes (Addendum)
Pt denies having a cardiologist.    PCP Dr Pollie Meyer at [facility]. States Dr Vertell Limber wanted her to get a clearance and she has an appointment on Monday.  HX PE and DVT. Was instructed to stop Alen Blew on May 20th.  EKG done today

## 2015-09-09 DIAGNOSIS — H04123 Dry eye syndrome of bilateral lacrimal glands: Secondary | ICD-10-CM | POA: Diagnosis not present

## 2015-09-13 DIAGNOSIS — Z86711 Personal history of pulmonary embolism: Secondary | ICD-10-CM | POA: Diagnosis not present

## 2015-09-13 DIAGNOSIS — E785 Hyperlipidemia, unspecified: Secondary | ICD-10-CM | POA: Diagnosis not present

## 2015-09-13 DIAGNOSIS — Z7901 Long term (current) use of anticoagulants: Secondary | ICD-10-CM | POA: Diagnosis not present

## 2015-09-13 DIAGNOSIS — L4050 Arthropathic psoriasis, unspecified: Secondary | ICD-10-CM | POA: Diagnosis not present

## 2015-09-13 DIAGNOSIS — Z01818 Encounter for other preprocedural examination: Secondary | ICD-10-CM | POA: Diagnosis not present

## 2015-09-13 DIAGNOSIS — F419 Anxiety disorder, unspecified: Secondary | ICD-10-CM | POA: Diagnosis not present

## 2015-09-13 DIAGNOSIS — E039 Hypothyroidism, unspecified: Secondary | ICD-10-CM | POA: Diagnosis not present

## 2015-09-13 DIAGNOSIS — M7138 Other bursal cyst, other site: Secondary | ICD-10-CM | POA: Diagnosis not present

## 2015-09-16 ENCOUNTER — Inpatient Hospital Stay (HOSPITAL_COMMUNITY): Payer: Medicare Other

## 2015-09-16 ENCOUNTER — Inpatient Hospital Stay (HOSPITAL_COMMUNITY)
Admission: RE | Admit: 2015-09-16 | Discharge: 2015-09-17 | DRG: 520 | Disposition: A | Discharge status: Home / Self Care | Payer: Medicare Other | Source: Ambulatory Visit | Attending: Neurosurgery | Admitting: Neurosurgery

## 2015-09-16 ENCOUNTER — Inpatient Hospital Stay (HOSPITAL_COMMUNITY): Payer: Medicare Other | Admitting: Certified Registered"

## 2015-09-16 ENCOUNTER — Encounter (HOSPITAL_COMMUNITY)
Admission: RE | Disposition: A | Discharge status: Home / Self Care | Payer: Self-pay | Source: Ambulatory Visit | Attending: Neurosurgery

## 2015-09-16 DIAGNOSIS — Z86711 Personal history of pulmonary embolism: Secondary | ICD-10-CM

## 2015-09-16 DIAGNOSIS — M961 Postlaminectomy syndrome, not elsewhere classified: Secondary | ICD-10-CM | POA: Diagnosis not present

## 2015-09-16 DIAGNOSIS — Z86718 Personal history of other venous thrombosis and embolism: Secondary | ICD-10-CM | POA: Diagnosis not present

## 2015-09-16 DIAGNOSIS — M4726 Other spondylosis with radiculopathy, lumbar region: Secondary | ICD-10-CM | POA: Diagnosis present

## 2015-09-16 DIAGNOSIS — K219 Gastro-esophageal reflux disease without esophagitis: Secondary | ICD-10-CM | POA: Diagnosis not present

## 2015-09-16 DIAGNOSIS — Z87891 Personal history of nicotine dependence: Secondary | ICD-10-CM

## 2015-09-16 DIAGNOSIS — M7138 Other bursal cyst, other site: Secondary | ICD-10-CM | POA: Diagnosis not present

## 2015-09-16 DIAGNOSIS — F329 Major depressive disorder, single episode, unspecified: Secondary | ICD-10-CM | POA: Diagnosis present

## 2015-09-16 DIAGNOSIS — F419 Anxiety disorder, unspecified: Secondary | ICD-10-CM | POA: Diagnosis present

## 2015-09-16 DIAGNOSIS — M4690 Unspecified inflammatory spondylopathy, site unspecified: Secondary | ICD-10-CM | POA: Diagnosis present

## 2015-09-16 DIAGNOSIS — M4806 Spinal stenosis, lumbar region: Principal | ICD-10-CM | POA: Diagnosis present

## 2015-09-16 DIAGNOSIS — M7130 Other bursal cyst, unspecified site: Secondary | ICD-10-CM | POA: Diagnosis not present

## 2015-09-16 DIAGNOSIS — Z419 Encounter for procedure for purposes other than remedying health state, unspecified: Secondary | ICD-10-CM

## 2015-09-16 DIAGNOSIS — E039 Hypothyroidism, unspecified: Secondary | ICD-10-CM | POA: Diagnosis not present

## 2015-09-16 HISTORY — PX: LUMBAR LAMINECTOMY/DECOMPRESSION MICRODISCECTOMY: SHX5026

## 2015-09-16 SURGERY — LUMBAR LAMINECTOMY/DECOMPRESSION MICRODISCECTOMY 1 LEVEL
Anesthesia: General | Site: Spine Lumbar | Laterality: Right

## 2015-09-16 MED ORDER — LEVOTHYROXINE SODIUM 88 MCG PO TABS
88.0000 ug | ORAL_TABLET | Freq: Every day | ORAL | Status: DC
  Administered 2015-09-17: 88 ug via ORAL
  Filled 2015-09-16: qty 1

## 2015-09-16 MED ORDER — 0.9 % SODIUM CHLORIDE (POUR BTL) OPTIME
TOPICAL | Status: DC | PRN
  Administered 2015-09-16: 1000 mL

## 2015-09-16 MED ORDER — FENTANYL CITRATE (PF) 250 MCG/5ML IJ SOLN
INTRAMUSCULAR | Status: AC
  Filled 2015-09-16: qty 5

## 2015-09-16 MED ORDER — LACTATED RINGERS IV SOLN
INTRAVENOUS | Status: DC
  Administered 2015-09-16 (×2): via INTRAVENOUS

## 2015-09-16 MED ORDER — THROMBIN 5000 UNITS EX SOLR
CUTANEOUS | Status: DC | PRN
  Administered 2015-09-16 (×2): 5000 [IU] via TOPICAL

## 2015-09-16 MED ORDER — METHYLPREDNISOLONE ACETATE 80 MG/ML IJ SUSP
INTRAMUSCULAR | Status: DC | PRN
  Administered 2015-09-16: 80 mg

## 2015-09-16 MED ORDER — EPHEDRINE SULFATE 50 MG/ML IJ SOLN
INTRAMUSCULAR | Status: DC | PRN
  Administered 2015-09-16: 10 mg via INTRAVENOUS

## 2015-09-16 MED ORDER — BUPROPION HCL ER (XL) 150 MG PO TB24
150.0000 mg | ORAL_TABLET | Freq: Every day | ORAL | Status: DC
  Administered 2015-09-17: 150 mg via ORAL
  Filled 2015-09-16: qty 1

## 2015-09-16 MED ORDER — DOCUSATE SODIUM 100 MG PO CAPS
100.0000 mg | ORAL_CAPSULE | Freq: Two times a day (BID) | ORAL | Status: DC
  Administered 2015-09-16: 100 mg via ORAL
  Filled 2015-09-16: qty 1

## 2015-09-16 MED ORDER — PROPOFOL 10 MG/ML IV BOLUS
INTRAVENOUS | Status: AC
  Filled 2015-09-16: qty 20

## 2015-09-16 MED ORDER — ZOLPIDEM TARTRATE 5 MG PO TABS: 5.0000 mg | ORAL_TABLET | Freq: Every evening | ORAL | Status: DC | PRN

## 2015-09-16 MED ORDER — FENTANYL CITRATE (PF) 100 MCG/2ML IJ SOLN
INTRAMUSCULAR | Status: AC
  Administered 2015-09-16: 50 ug via INTRAVENOUS
  Filled 2015-09-16: qty 2

## 2015-09-16 MED ORDER — ALUM & MAG HYDROXIDE-SIMETH 200-200-20 MG/5ML PO SUSP: 30.0000 mL | Freq: Four times a day (QID) | ORAL | Status: DC | PRN

## 2015-09-16 MED ORDER — MIDAZOLAM HCL 2 MG/2ML IJ SOLN
INTRAMUSCULAR | Status: AC
  Filled 2015-09-16: qty 2

## 2015-09-16 MED ORDER — MEPERIDINE HCL 25 MG/ML IJ SOLN: 6.2500 mg | INTRAMUSCULAR | Status: DC | PRN

## 2015-09-16 MED ORDER — PANTOPRAZOLE SODIUM 40 MG PO TBEC
40.0000 mg | DELAYED_RELEASE_TABLET | Freq: Every day | ORAL | Status: DC
  Administered 2015-09-16: 40 mg via ORAL
  Filled 2015-09-16: qty 1

## 2015-09-16 MED ORDER — LIDOCAINE-EPINEPHRINE 1 %-1:100000 IJ SOLN
INTRAMUSCULAR | Status: DC | PRN
  Administered 2015-09-16: 5 mL

## 2015-09-16 MED ORDER — ROCURONIUM BROMIDE 100 MG/10ML IV SOLN
INTRAVENOUS | Status: DC | PRN
  Administered 2015-09-16: 50 mg via INTRAVENOUS

## 2015-09-16 MED ORDER — KCL IN DEXTROSE-NACL 20-5-0.45 MEQ/L-%-% IV SOLN: INTRAVENOUS | Status: DC

## 2015-09-16 MED ORDER — CALCIUM CARBONATE ANTACID 600 MG PO CHEW: 1800.0000 mg | CHEWABLE_TABLET | Freq: Every day | ORAL | Status: DC

## 2015-09-16 MED ORDER — PHENOL 1.4 % MT LIQD
1.0000 | OROMUCOSAL | Status: DC | PRN
Start: 2015-09-16 — End: 2015-09-17

## 2015-09-16 MED ORDER — METHOCARBAMOL 500 MG PO TABS
ORAL_TABLET | ORAL | Status: AC
  Administered 2015-09-16: 500 mg via ORAL
  Filled 2015-09-16: qty 1

## 2015-09-16 MED ORDER — FLEET ENEMA 7-19 GM/118ML RE ENEM: 1.0000 | ENEMA | Freq: Once | RECTAL | Status: DC | PRN

## 2015-09-16 MED ORDER — FENTANYL CITRATE (PF) 100 MCG/2ML IJ SOLN
25.0000 ug | INTRAMUSCULAR | Status: DC | PRN
  Administered 2015-09-16 (×3): 50 ug via INTRAVENOUS

## 2015-09-16 MED ORDER — OMEGA-3-ACID ETHYL ESTERS 1 G PO CAPS
1.0000 g | ORAL_CAPSULE | Freq: Two times a day (BID) | ORAL | Status: DC
  Administered 2015-09-16: 1 g via ORAL
  Filled 2015-09-16 (×2): qty 1

## 2015-09-16 MED ORDER — BISACODYL 10 MG RE SUPP: 10.0000 mg | Freq: Every day | RECTAL | Status: DC | PRN

## 2015-09-16 MED ORDER — ACETAMINOPHEN 325 MG PO TABS: 650.0000 mg | ORAL_TABLET | ORAL | Status: DC | PRN

## 2015-09-16 MED ORDER — METHOCARBAMOL 1000 MG/10ML IJ SOLN
500.0000 mg | Freq: Four times a day (QID) | INTRAMUSCULAR | Status: DC | PRN
  Filled 2015-09-16: qty 5

## 2015-09-16 MED ORDER — MIDAZOLAM HCL 5 MG/5ML IJ SOLN
INTRAMUSCULAR | Status: DC | PRN
  Administered 2015-09-16: 2 mg via INTRAVENOUS
  Administered 2015-09-16: 1 mg via INTRAVENOUS

## 2015-09-16 MED ORDER — ROCURONIUM BROMIDE 50 MG/5ML IV SOLN
INTRAVENOUS | Status: AC
  Filled 2015-09-16: qty 1

## 2015-09-16 MED ORDER — FENTANYL CITRATE (PF) 100 MCG/2ML IJ SOLN
INTRAMUSCULAR | Status: DC | PRN
  Administered 2015-09-16: 100 ug via INTRAVENOUS
  Administered 2015-09-16: 50 ug via INTRAVENOUS
  Administered 2015-09-16: 100 ug via INTRAVENOUS

## 2015-09-16 MED ORDER — PROPOFOL 10 MG/ML IV BOLUS
INTRAVENOUS | Status: DC | PRN
  Administered 2015-09-16: 20 mg via INTRAVENOUS
  Administered 2015-09-16: 150 mg via INTRAVENOUS

## 2015-09-16 MED ORDER — MENTHOL 3 MG MT LOZG: 1.0000 | LOZENGE | OROMUCOSAL | Status: DC | PRN

## 2015-09-16 MED ORDER — METHOCARBAMOL 500 MG PO TABS
500.0000 mg | ORAL_TABLET | Freq: Four times a day (QID) | ORAL | Status: DC | PRN
  Administered 2015-09-16: 500 mg via ORAL

## 2015-09-16 MED ORDER — METOCLOPRAMIDE HCL 5 MG/ML IJ SOLN: 10.0000 mg | Freq: Once | INTRAMUSCULAR | Status: DC | PRN

## 2015-09-16 MED ORDER — PANTOPRAZOLE SODIUM 40 MG IV SOLR: 40.0000 mg | Freq: Every day | INTRAVENOUS | Status: DC

## 2015-09-16 MED ORDER — SENNOSIDES-DOCUSATE SODIUM 8.6-50 MG PO TABS: 1.0000 | ORAL_TABLET | Freq: Every evening | ORAL | Status: DC | PRN

## 2015-09-16 MED ORDER — FLUTICASONE PROPIONATE 50 MCG/ACT NA SUSP: 1.0000 | Freq: Every day | NASAL | Status: DC | PRN

## 2015-09-16 MED ORDER — VANCOMYCIN HCL IN DEXTROSE 1-5 GM/200ML-% IV SOLN
INTRAVENOUS | Status: AC
  Administered 2015-09-16: 1000 mg via INTRAVENOUS
  Filled 2015-09-16: qty 200

## 2015-09-16 MED ORDER — CELECOXIB 200 MG PO CAPS: 200.0000 mg | ORAL_CAPSULE | Freq: Two times a day (BID) | ORAL | Status: DC

## 2015-09-16 MED ORDER — PHENYLEPHRINE HCL 10 MG/ML IJ SOLN
10.0000 mg | INTRAVENOUS | Status: DC | PRN
  Administered 2015-09-16: 20 ug/min via INTRAVENOUS

## 2015-09-16 MED ORDER — OXYCODONE-ACETAMINOPHEN 5-325 MG PO TABS
ORAL_TABLET | ORAL | Status: AC
  Administered 2015-09-16: 2 via ORAL
  Filled 2015-09-16: qty 2

## 2015-09-16 MED ORDER — CALCIUM CARBONATE-VITAMIN D 500-200 MG-UNIT PO TABS: 1.0000 | ORAL_TABLET | Freq: Every day | ORAL | Status: DC

## 2015-09-16 MED ORDER — SUGAMMADEX SODIUM 200 MG/2ML IV SOLN
INTRAVENOUS | Status: DC | PRN
  Administered 2015-09-16: 200 mg via INTRAVENOUS

## 2015-09-16 MED ORDER — ATORVASTATIN CALCIUM 20 MG PO TABS
10.0000 mg | ORAL_TABLET | ORAL | Status: DC
  Administered 2015-09-17: 10 mg via ORAL
  Filled 2015-09-16: qty 1

## 2015-09-16 MED ORDER — SUGAMMADEX SODIUM 200 MG/2ML IV SOLN
INTRAVENOUS | Status: AC
  Filled 2015-09-16: qty 2

## 2015-09-16 MED ORDER — SODIUM CHLORIDE 0.9% FLUSH: 3.0000 mL | INTRAVENOUS | Status: DC | PRN

## 2015-09-16 MED ORDER — CLINDAMYCIN PHOSPHATE 1 % EX SOLN
1.0000 "application " | Freq: Two times a day (BID) | CUTANEOUS | Status: DC
  Filled 2015-09-16: qty 30

## 2015-09-16 MED ORDER — CALCIUM CITRATE-VITAMIN D 315-200 MG-UNIT PO TABS: 1.0000 | ORAL_TABLET | Freq: Two times a day (BID) | ORAL | Status: DC

## 2015-09-16 MED ORDER — DIAZEPAM 2 MG PO TABS
2.0000 mg | ORAL_TABLET | Freq: Every evening | ORAL | Status: DC | PRN
  Administered 2015-09-16: 2 mg via ORAL
  Filled 2015-09-16: qty 1

## 2015-09-16 MED ORDER — LIDOCAINE HCL (CARDIAC) 20 MG/ML IV SOLN
INTRAVENOUS | Status: DC | PRN
  Administered 2015-09-16: 50 mg via INTRAVENOUS

## 2015-09-16 MED ORDER — HEMOSTATIC AGENTS (NO CHARGE) OPTIME
TOPICAL | Status: DC | PRN
  Administered 2015-09-16: 1 via TOPICAL

## 2015-09-16 MED ORDER — BUPIVACAINE HCL (PF) 0.5 % IJ SOLN
INTRAMUSCULAR | Status: DC | PRN
  Administered 2015-09-16: 5 mL

## 2015-09-16 MED ORDER — SODIUM CHLORIDE 0.9% FLUSH: 3.0000 mL | Freq: Two times a day (BID) | INTRAVENOUS | Status: DC

## 2015-09-16 MED ORDER — ACETAMINOPHEN ER 650 MG PO TBCR: 650.0000 mg | EXTENDED_RELEASE_TABLET | Freq: Two times a day (BID) | ORAL | Status: DC | PRN

## 2015-09-16 MED ORDER — FOLIC ACID 1 MG PO TABS
1000.0000 ug | ORAL_TABLET | ORAL | Status: DC
  Administered 2015-09-17: 1 mg via ORAL
  Filled 2015-09-16: qty 1

## 2015-09-16 MED ORDER — VANCOMYCIN HCL IN DEXTROSE 1-5 GM/200ML-% IV SOLN
1000.0000 mg | Freq: Once | INTRAVENOUS | Status: AC
  Administered 2015-09-16: 1000 mg via INTRAVENOUS
  Filled 2015-09-16: qty 200

## 2015-09-16 MED ORDER — HYDROCODONE-ACETAMINOPHEN 5-325 MG PO TABS: 1.0000 | ORAL_TABLET | ORAL | Status: DC | PRN

## 2015-09-16 MED ORDER — OXYCODONE-ACETAMINOPHEN 5-325 MG PO TABS
1.0000 | ORAL_TABLET | ORAL | Status: DC | PRN
  Administered 2015-09-16 – 2015-09-17 (×3): 2 via ORAL
  Filled 2015-09-16 (×2): qty 2

## 2015-09-16 MED ORDER — HYDROMORPHONE HCL 1 MG/ML IJ SOLN: 0.5000 mg | INTRAMUSCULAR | Status: DC | PRN

## 2015-09-16 MED ORDER — ONDANSETRON HCL 4 MG/2ML IJ SOLN: 4.0000 mg | INTRAMUSCULAR | Status: DC | PRN

## 2015-09-16 MED ORDER — FENTANYL CITRATE (PF) 100 MCG/2ML IJ SOLN
INTRAMUSCULAR | Status: DC | PRN
  Administered 2015-09-16: 100 ug via INTRAVENOUS

## 2015-09-16 MED ORDER — ACETAMINOPHEN 650 MG RE SUPP: 650.0000 mg | RECTAL | Status: DC | PRN

## 2015-09-16 SURGICAL SUPPLY — 62 items
ADH SKN CLS APL DERMABOND .7 (GAUZE/BANDAGES/DRESSINGS) ×1
APL SKNCLS STERI-STRIP NONHPOA (GAUZE/BANDAGES/DRESSINGS)
BENZOIN TINCTURE PRP APPL 2/3 (GAUZE/BANDAGES/DRESSINGS) IMPLANT
BIT DRILL NEURO 2X3.1 SFT TUCH (MISCELLANEOUS) ×1 IMPLANT
BLADE CLIPPER SURG (BLADE) IMPLANT
BUR ROUND FLUTED 5 RND (BURR) ×2 IMPLANT
CANISTER SUCT 3000ML PPV (MISCELLANEOUS) ×2 IMPLANT
DECANTER SPIKE VIAL GLASS SM (MISCELLANEOUS) ×2 IMPLANT
DERMABOND ADVANCED (GAUZE/BANDAGES/DRESSINGS) ×1
DERMABOND ADVANCED .7 DNX12 (GAUZE/BANDAGES/DRESSINGS) ×1 IMPLANT
DRAPE LAPAROTOMY 100X72X124 (DRAPES) ×2 IMPLANT
DRAPE MICROSCOPE LEICA (MISCELLANEOUS) ×2 IMPLANT
DRAPE POUCH INSTRU U-SHP 10X18 (DRAPES) ×2 IMPLANT
DRAPE SURG 17X23 STRL (DRAPES) ×2 IMPLANT
DRILL NEURO 2X3.1 SOFT TOUCH (MISCELLANEOUS) ×2
DRSG OPSITE POSTOP 3X4 (GAUZE/BANDAGES/DRESSINGS) ×1 IMPLANT
DURAPREP 26ML APPLICATOR (WOUND CARE) ×2 IMPLANT
ELECT REM PT RETURN 9FT ADLT (ELECTROSURGICAL) ×2
ELECTRODE REM PT RTRN 9FT ADLT (ELECTROSURGICAL) ×1 IMPLANT
GAUZE SPONGE 4X4 12PLY STRL (GAUZE/BANDAGES/DRESSINGS) IMPLANT
GAUZE SPONGE 4X4 16PLY XRAY LF (GAUZE/BANDAGES/DRESSINGS) IMPLANT
GLOVE BIO SURGEON STRL SZ7 (GLOVE) ×1 IMPLANT
GLOVE BIO SURGEON STRL SZ8 (GLOVE) ×3 IMPLANT
GLOVE BIOGEL PI IND STRL 6.5 (GLOVE) IMPLANT
GLOVE BIOGEL PI IND STRL 8 (GLOVE) ×1 IMPLANT
GLOVE BIOGEL PI IND STRL 8.5 (GLOVE) ×1 IMPLANT
GLOVE BIOGEL PI INDICATOR 6.5 (GLOVE) ×1
GLOVE BIOGEL PI INDICATOR 8 (GLOVE) ×1
GLOVE BIOGEL PI INDICATOR 8.5 (GLOVE) ×1
GLOVE ECLIPSE 8.0 STRL XLNG CF (GLOVE) ×2 IMPLANT
GLOVE EXAM NITRILE LRG STRL (GLOVE) IMPLANT
GLOVE EXAM NITRILE MD LF STRL (GLOVE) IMPLANT
GLOVE EXAM NITRILE XL STR (GLOVE) IMPLANT
GLOVE EXAM NITRILE XS STR PU (GLOVE) IMPLANT
GLOVE SURG SS PI 6.5 STRL IVOR (GLOVE) ×1 IMPLANT
GOWN STRL REUS W/ TWL LRG LVL3 (GOWN DISPOSABLE) IMPLANT
GOWN STRL REUS W/ TWL XL LVL3 (GOWN DISPOSABLE) ×1 IMPLANT
GOWN STRL REUS W/TWL 2XL LVL3 (GOWN DISPOSABLE) ×3 IMPLANT
GOWN STRL REUS W/TWL LRG LVL3 (GOWN DISPOSABLE) ×2
GOWN STRL REUS W/TWL XL LVL3 (GOWN DISPOSABLE) ×2
KIT BASIN OR (CUSTOM PROCEDURE TRAY) ×2 IMPLANT
KIT ROOM TURNOVER OR (KITS) ×2 IMPLANT
NDL HYPO 18GX1.5 BLUNT FILL (NEEDLE) IMPLANT
NDL HYPO 25X1 1.5 SAFETY (NEEDLE) ×1 IMPLANT
NDL SPNL 18GX3.5 QUINCKE PK (NEEDLE) IMPLANT
NEEDLE HYPO 18GX1.5 BLUNT FILL (NEEDLE) ×2 IMPLANT
NEEDLE HYPO 25X1 1.5 SAFETY (NEEDLE) ×2 IMPLANT
NEEDLE SPNL 18GX3.5 QUINCKE PK (NEEDLE) ×2 IMPLANT
NS IRRIG 1000ML POUR BTL (IV SOLUTION) ×2 IMPLANT
PACK LAMINECTOMY NEURO (CUSTOM PROCEDURE TRAY) ×2 IMPLANT
PAD ARMBOARD 7.5X6 YLW CONV (MISCELLANEOUS) ×8 IMPLANT
RUBBERBAND STERILE (MISCELLANEOUS) ×4 IMPLANT
SPONGE SURGIFOAM ABS GEL SZ50 (HEMOSTASIS) ×2 IMPLANT
STRIP CLOSURE SKIN 1/2X4 (GAUZE/BANDAGES/DRESSINGS) IMPLANT
SUT VIC AB 0 CT1 18XCR BRD8 (SUTURE) ×1 IMPLANT
SUT VIC AB 0 CT1 8-18 (SUTURE) ×2
SUT VIC AB 2-0 CT1 18 (SUTURE) ×2 IMPLANT
SUT VIC AB 3-0 SH 8-18 (SUTURE) ×2 IMPLANT
SYR 5ML LL (SYRINGE) ×1 IMPLANT
TOWEL OR 17X24 6PK STRL BLUE (TOWEL DISPOSABLE) ×2 IMPLANT
TOWEL OR 17X26 10 PK STRL BLUE (TOWEL DISPOSABLE) ×2 IMPLANT
WATER STERILE IRR 1000ML POUR (IV SOLUTION) ×2 IMPLANT

## 2015-09-16 NOTE — Transfer of Care (Signed)
Immediate Anesthesia Transfer of Care Note  Patient: Ashley Griffith  Procedure(s) Performed: Procedure(s) with comments: Right Lumbar Four-FiveLaminectomy with resection of synovial cyst (Right) - right  Patient Location: PACU  Anesthesia Type:General  Level of Consciousness: awake, alert  and oriented  Airway & Oxygen Therapy: Patient connected to face mask oxygen  Post-op Assessment: Post -op Vital signs reviewed and stable  Post vital signs: stable  Last Vitals:  Filed Vitals:   09/16/15 0919  BP: 127/75  Pulse: 71  Temp: 36.7 C  Resp: 20    Last Pain: There were no vitals filed for this visit.       Complications: No apparent anesthesia complications

## 2015-09-16 NOTE — Brief Op Note (Signed)
09/16/2015  2:53 PM  PATIENT:  Ashley Griffith  70 y.o. female  PRE-OPERATIVE DIAGNOSIS:  Synovial cyst L 45 Right with stenosis and radiculopathy, spondylosis  POST-OPERATIVE DIAGNOSIS:  Synovial cyst L 45 Right with stenosis and radiculopathy, spondylosis  PROCEDURE:  Procedure(s) with comments: Right Lumbar Four-FiveLaminectomy with resection of synovial cyst (Right) - right with microdissection  SURGEON:  Surgeon(s) and Role:    * Erline Levine, MD - Primary    * Eustace Moore, MD - Assisting  PHYSICIAN ASSISTANT:   ASSISTANTS: [place], RN   ANESTHESIA:   general  EBL:  Total I/O In: -  Out: 100 [Blood:100]  BLOOD ADMINISTERED:none  DRAINS: none   LOCAL MEDICATIONS USED:  MARCAINE    and LIDOCAINE   SPECIMEN:  No Specimen  DISPOSITION OF SPECIMEN:  N/A  COUNTS:  YES  TOURNIQUET:  * No tourniquets in log *  DICTATION: Patient has a large synovial cyst at L 4 - 5 on the right with stenosis and radiculopathy. It was elected to take her to surgery for right L4-5 resection of synovial cyst with microdissection.  Procedure: Patient was brought to the operating room and following the smooth and uncomplicated induction of general endotracheal anesthesia she was placed in a prone position on the Wilson frame. Low back was prepped and draped in the usual sterile fashion with betadine scrub and DuraPrep. Preoperative localizing Xray was obtained with spinal needle.  Area of planned incision was infiltrated with local lidocaine. Incision was made in the midline and carried to the lumbodorsal fascia which was incised on the right side of midline. Subperiosteal dissection was performed exposing what was felt to be L45 level. Intraoperative x-ray demonstrated marker probes at L5-S1 and L4-5.   A hemi-semi-laminectomy of L4 was performed a high-speed drill and completed with Kerrison rongeurs and a generous foraminotomy was performed overlying the superior aspect of the L5 lamina.  Ligamentum flavum was detached and removed in a piecemeal fashion and the L5 nerve root was decompressed laterally with removal of the superior aspect of the facet and ligamentum causing nerve root compression. The microscope was brought into the field and the L5 nerve root was mobilized medially and carefully dissected from the synovial cyst which was adherent to the dura.  Using microdissection, the cyst was removed and neural elements thoroughly decompressed.   The cyst contents were removed and neural elements were widely decompressed.  I was not able to remove the entire cyst wall, as this appeared to be fused to the dura.  There was no violation of the dura.  Hemostasis was assured with bipolar electrocautery and the wound was irrigated with Depo-Medrol and fentanyl. The lumbodorsal fascia was closed with 0 Vicryl sutures the subcutaneous tissues reapproximated 2-0 Vicryl inverted sutures and the skin edges were reapproximated with 3-0 Vicryl subcuticular stitch. The wound is dressed with Dermabond. Patient was extubated in the operating room and taken to recovery in stable and satisfactory condition having tolerated his operation well counts were correct at the end of the case.  PLAN OF CARE: Admit to inpatient   PATIENT DISPOSITION:  PACU - hemodynamically stable.   Delay start of Pharmacological VTE agent (>24hrs) due to surgical blood loss or risk of bleeding: yes

## 2015-09-16 NOTE — H&P (Signed)
Patient ID:   [id number] Patient: Ashley Griffith  Date of Birth: 09-Sep-1945 Visit Type: Office Visit   Date: 08/25/2015 01:00 PM Provider: Marchia Meiers. Vertell Limber MD   This 70 year old female presents for Pain.  History of Present Illness: 1.  Pain    Ashley Griffith, 70 year old female, retired Licensed conveyancer, visits for second opinion regarding lumbar and right buttock pain.  Patient recalls no injury, stating pain began soon after chiropractic treatments in January 2017.  7/10 pain at its worst.  2/10 pain most of the time. patient initially saw Dr. Evans Griffith that she could seek a second opinion and she comes in today for a second opinion  Steroid taper 2 helped only short-term Celebrex 200 mg twice a day  History: GERD, PE and DVT March 1995, DVT August 1996, psoriatic arthritis, hypothyroidism, depression/anxiety Surgical history: Tonsillectomy 1955, nasal polyp 1968, abdominal mass 1972, sphincterotomy 1996, right knee scope 2014, cataracts 2006 and 2007  MRI/X-ray on [facility]  07/03/15 MRI: Bilateral L4-5 facet arthritis with prominent edema. 12 mm right-sided synovial cyst contributes to moderate to severe right lateral recess and mild spinal stenosis in combination with disc bulging and posterior element hypertrophy.   08/25/15 X-ray: No evidence of instability on dynamic X-ray        PAST MEDICAL/SURGICAL HISTORY   (Detailed)     PAST MEDICAL HISTORY, SURGICAL HISTORY, FAMILY HISTORY, SOCIAL HISTORY AND REVIEW OF SYSTEMS  08/25/2015, which I have signed.  Family History  (Detailed)   SOCIAL HISTORY  (Detailed) Tobacco use reviewed. Preferred language is Unknown.   Smoking status: Never smoker.  SMOKING STATUS Use Status Type Smoking Status Usage Per Day Years Used Total Pack Years  no/never  Never smoker       HOME ENVIRONMENT/SAFETY The patient has not fallen in the last year.        MEDICATIONS(added, continued or stopped this visit): Started  Medication Directions Instruction Stopped   Antacid (calcium carbonate) 200 mg calcium (500 mg) chewable tablet Take 2 tablets daily     atorvastatin 10 mg tablet take 1 tablet by oral route  every day     calcium supplements  ORAL Take 1 tablet daily     Celebrex 200 mg capsule take 1 capsule by oral route  every day as needed     Enbrel 50 mg/mL (0.98 mL) subcutaneous syringe inject 1 milliliter by subcutaneous route  every week     folic acid 408 mcg tablet take 1 tablet by oral route  every day     Omega 3 Fish Oil 684 mg-1,200 mg capsule,delayed release Take 1 tablet daily     prednisone 5 mg tablet take 1 tablet by oral route  every day     Synthroid 88 mcg tablet take 1 tablet by oral route  every day     Ticaspray 50 mcg-0.9 % kit, spray suspension and spray 2 sprays in each nostril     Trexall 10 mg tablet take 1 tablet by oral route  every week     Tylenol 325 mg tablet take 2 tablet by oral route  every 4 hours as needed     Wellbutrin XL 150 mg 24 hr tablet, extended release take 1 tablet by oral route  every day       ALLERGIES: Ingredient Reaction Medication Name Comment  ERYTHROMYCIN ESTOLATE     SECUKINUMAB  Cosentyx   PENICILLIN      Reviewed, updated.   REVIEW OF SYSTEMS System Neg/Pos Details  GU Negative Dysuria, hematuria, hot flashes, irregular menses, polyuria, urinary frequency, urinary incontinence and urinary retention.     Vitals Date Temp F BP Pulse Ht In Wt Lb BMI BSA Pain Score  08/25/2015  156/71 74 65 178 29.62  2/10     PHYSICAL EXAM General Level of Distress: no acute distress  Head and Face  Right Left  Fundoscopic Exam:  normal normal    Cardiovascular Cardiac: regular rate and rhythm without murmur  Right Left  Carotid Pulses: normal normal  Respiratory Lungs: clear to auscultation  Neurological Orientation: normal Recent and Remote Memory: normal Attention Span and Concentration:   normal Language: normal Fund of  Knowledge: normal  Right Left Sensation: normal normal Upper Extremity Coordination: normal normal  Lower Extremity Coordination: normal normal  Musculoskeletal Gait and Station: normal  Right Left Upper Extremity Muscle Strength: normal normal Lower Extremity Muscle Strength: normal normal Upper Extremity Muscle Tone:  normal normal Lower Extremity Muscle Tone: normal normal  Motor Strength Upper and lower extremity motor strength was tested in the clinically pertinent muscles. Any abnormal findings will be noted below.   Right Left Tib Anterior: 4/5  EHL: 4/5    Deep Tendon Reflexes  Right Left Biceps: normal normal Triceps: normal normal Brachiloradialis: normal normal Patellar: normal normal Achilles: normal normal  Sensory Sensation was tested at L1 to S1.   Cranial Nerves II. Optic Nerve/Visual Fields: normal III. Oculomotor: normal IV. Trochlear: normal V. Trigeminal: normal VI. Abducens: normal VII. Facial: normal VIII. Acoustic/Vestibular: normal IX. Glossopharyngeal: normal X. Vagus: normal XI. Spinal Accessory: normal XII. Hypoglossal: normal  Motor and other Tests Lhermittes: negative Rhomberg: negative Pronator drift: absent     Right Left Hoffman's: normal normal Clonus: normal normal Babinski: normal normal SLR: negative negative Patrick's Corky Sox): negative negative Toe Walk: normal normal Toe Lift: normal normal Heel Walk: normal normal SI Joint: nontender nontender   Additional Findings:  Right hip abductor 4/5. Right sciatic notch pain.   DIAGNOSTIC RESULTS 07/03/15 MRI: Bilateral L4-5 facet arthritis with prominent edema. 12 mm right-sided synovial cyst contributes to moderate to severe right lateral recess and mild spinal stenosis in combination with disc bulging and posterior element hypertrophy.  08/25/15 X-ray: No evidence of instability on dynamic X-ray    IMPRESSION The patient is experiencing right lumbar/buttock  pain. On MRI, there is evidence of a synovial cyst at the L4-5 level, causing mild stenosis. This is supported on physical exam with right sciatic notch pain, right EHL 4/5 and right hip abduction 4/5 weakness. I am recommending surgical removal of the L4-5 synovial cyst to alleviate her right-sided pain.  Completed Orders (this encounter) Order Details Reason Side Interpretation Result Initial Treatment Date Region  Hypertension education Follow up with primary care physician for relief of elevated blood pressure.        Dietary management education, guidance, and counseling encouraged to eat a well balanced diet and follow up with primary care doctor.        Lumbar Spine- AP/Lat/Flex/Ex      08/25/2015 All Levels to All Levels   Assessment/Plan # Detail Type Description   1. Assessment Elevated blood-pressure reading, w/o diagnosis of htn (R03.0).       2. Assessment Body mass index (BMI) 29.0-29.9, adult (W10.27).   Plan Orders Today's instructions / counseling include(s) Dietary management education, guidance, and counseling.       3. Assessment Lumbar radiculopathy (M54.16).         Pain Assessment/Treatment Pain Scale:  2/10. Method: Numeric Pain Intensity Scale. Location: Lower Back. Onset: 05/25/2015. Duration: varies. Quality: discomforting.  Fall Risk Plan The patient has not fallen in the last year.  Schedule for synovial cyst resection.   Orders: Diagnostic Procedures: Assessment Procedure  M54.16 Lumbar Spine- AP/Lat/Flex/Ex  Instruction(s)/Education: Assessment Instruction  R03.0 Hypertension education  Z68.29 Dietary management education, guidance, and counseling             Provider:  Marchia Meiers. Vertell Limber MD  08/25/2015 02:26 PM Dictation edited by: Marchia Meiers. Vertell Limber    CC Providers: Erline Levine MD [address] [place], [place] [address]              Electronically signed by Marchia Meiers. Vertell Limber MD on 08/25/2015 03:11 PM

## 2015-09-16 NOTE — Anesthesia Preprocedure Evaluation (Addendum)
Anesthesia Evaluation  Patient identified by MRN, date of birth, ID band Patient awake    Reviewed: Allergy & Precautions, NPO status , Patient's Chart, lab work & pertinent test results  History of Anesthesia Complications (+) PONV  Airway Mallampati: II  TM Distance: >3 FB Neck ROM: Full    Dental  (+) Teeth Intact, Caps   Pulmonary former smoker, PE Pt reports she was on estrogen hormone replacement therapy, and developed a DVT and PE.  She was anticoagulated on coumadin, and had another DVT when the coumadin was discontinued.  She was subsequently placed on continued anticoagulation (Xarelto)   breath sounds clear to auscultation       Cardiovascular + DVT   Rhythm:Regular Rate:Normal     Neuro/Psych Anxiety Depression negative neurological ROS     GI/Hepatic Neg liver ROS, GERD  Medicated and Controlled,  Endo/Other  negative endocrine ROS  Renal/GU negative Renal ROS  negative genitourinary   Musculoskeletal  (+) Arthritis ,   Abdominal Normal abdominal exam  (+)   Peds negative pediatric ROS (+)  Hematology negative hematology ROS (+)   Anesthesia Other Findings   Reproductive/Obstetrics negative OB ROS                            Anesthesia Physical Anesthesia Plan  ASA: II  Anesthesia Plan: General   Post-op Pain Management:    Induction: Intravenous  Airway Management Planned: Oral ETT  Additional Equipment:   Intra-op Plan:   Post-operative Plan: Extubation in OR  Informed Consent: I have reviewed the patients History and Physical, chart, labs and discussed the procedure including the risks, benefits and alternatives for the proposed anesthesia with the patient or authorized representative who has indicated his/her understanding and acceptance.     Plan Discussed with: Anesthesiologist  Anesthesia Plan Comments:         Anesthesia Quick Evaluation

## 2015-09-16 NOTE — Discharge Instructions (Signed)

## 2015-09-16 NOTE — Anesthesia Procedure Notes (Signed)
Procedure Name: Intubation Date/Time: 09/16/2015 1:13 PM Performed by: Lavell Luster Pre-anesthesia Checklist: Patient identified, Emergency Drugs available, Suction available, Patient being monitored and Timeout performed Patient Re-evaluated:Patient Re-evaluated prior to inductionOxygen Delivery Method: Circle system utilized Preoxygenation: Pre-oxygenation with 100% oxygen Intubation Type: IV induction Ventilation: Mask ventilation without difficulty Laryngoscope Size: Mac and 3 Grade View: Grade II Tube type: Oral Tube size: 7.5 mm Number of attempts: 2 Airway Equipment and Method: Stylet Placement Confirmation: ETT inserted through vocal cords under direct vision,  positive ETCO2 and breath sounds checked- equal and bilateral Secured at: 22 cm Tube secured with: Tape Dental Injury: Teeth and Oropharynx as per pre-operative assessment  Comments: DL x 2 with MAC 3 blade by SRNA.  First intubation was recognized esophageal intubation.  ETT passed easily on second attempt.  Henderson Cloud, CRNA

## 2015-09-16 NOTE — Op Note (Signed)
09/16/2015  2:53 PM  PATIENT:  Ashley Griffith  70 y.o. female  PRE-OPERATIVE DIAGNOSIS:  Synovial cyst L 45 Right with stenosis and radiculopathy, spondylosis  POST-OPERATIVE DIAGNOSIS:  Synovial cyst L 45 Right with stenosis and radiculopathy, spondylosis  PROCEDURE:  Procedure(s) with comments: Right Lumbar Four-FiveLaminectomy with resection of synovial cyst (Right) - right with microdissection  SURGEON:  Surgeon(s) and Role:    * Erline Levine, MD - Primary    * Eustace Moore, MD - Assisting  PHYSICIAN ASSISTANT:   ASSISTANTS: Poteat, RN   ANESTHESIA:   general  EBL:  Total I/O In: -  Out: 100 [Blood:100]  BLOOD ADMINISTERED:none  DRAINS: none   LOCAL MEDICATIONS USED:  MARCAINE    and LIDOCAINE   SPECIMEN:  No Specimen  DISPOSITION OF SPECIMEN:  N/A  COUNTS:  YES  TOURNIQUET:  * No tourniquets in log *  DICTATION: Patient has a large synovial cyst at L 4 - 5 on the right with stenosis and radiculopathy. It was elected to take her to surgery for right L4-5 resection of synovial cyst with microdissection.  Procedure: Patient was brought to the operating room and following the smooth and uncomplicated induction of general endotracheal anesthesia she was placed in a prone position on the Wilson frame. Low back was prepped and draped in the usual sterile fashion with betadine scrub and DuraPrep. Preoperative localizing Xray was obtained with spinal needle.  Area of planned incision was infiltrated with local lidocaine. Incision was made in the midline and carried to the lumbodorsal fascia which was incised on the right side of midline. Subperiosteal dissection was performed exposing what was felt to be L45 level. Intraoperative x-ray demonstrated marker probes at L5-S1 and L4-5.   A hemi-semi-laminectomy of L4 was performed a high-speed drill and completed with Kerrison rongeurs and a generous foraminotomy was performed overlying the superior aspect of the L5 lamina.  Ligamentum flavum was detached and removed in a piecemeal fashion and the L5 nerve root was decompressed laterally with removal of the superior aspect of the facet and ligamentum causing nerve root compression. The microscope was brought into the field and the L5 nerve root was mobilized medially and carefully dissected from the synovial cyst which was adherent to the dura.  Using microdissection, the cyst was removed and neural elements thoroughly decompressed.   The cyst contents were removed and neural elements were widely decompressed.  I was not able to remove the entire cyst wall, as this appeared to be fused to the dura.  There was no violation of the dura.  Hemostasis was assured with bipolar electrocautery and the wound was irrigated with Depo-Medrol and fentanyl. The lumbodorsal fascia was closed with 0 Vicryl sutures the subcutaneous tissues reapproximated 2-0 Vicryl inverted sutures and the skin edges were reapproximated with 3-0 Vicryl subcuticular stitch. The wound is dressed with Dermabond. Patient was extubated in the operating room and taken to recovery in stable and satisfactory condition having tolerated his operation well counts were correct at the end of the case.  PLAN OF CARE: Admit to inpatient   PATIENT DISPOSITION:  PACU - hemodynamically stable.   Delay start of Pharmacological VTE agent (>24hrs) due to surgical blood loss or risk of bleeding: yes

## 2015-09-16 NOTE — Interval H&P Note (Signed)
History and Physical Interval Note:  09/16/2015 12:57 PM  Ashley Griffith  has presented today for surgery, with the diagnosis of Foraminal stenosis of lumbar region; Synovial cyst  The various methods of treatment have been discussed with the patient and family. After consideration of risks, benefits and other options for treatment, the patient has consented to  Procedure(s) with comments: Right L4-5 Laminectomy with resection of synovial cyst (Right) - Right L4-5 Laminectomy with resection of synovial cyst as a surgical intervention .  The patient's history has been reviewed, patient examined, no change in status, stable for surgery.  I have reviewed the patient's chart and labs.  Questions were answered to the patient's satisfaction.     Camelia Stelzner D

## 2015-09-17 ENCOUNTER — Encounter (HOSPITAL_COMMUNITY): Payer: Self-pay | Admitting: Neurosurgery

## 2015-09-17 MED ORDER — OXYCODONE-ACETAMINOPHEN 5-325 MG PO TABS: 1.0000 | ORAL_TABLET | ORAL | Status: DC | PRN

## 2015-09-17 NOTE — Progress Notes (Signed)
Utilization review completed.  

## 2015-09-17 NOTE — Progress Notes (Signed)
Subjective: Patient reports "I feel great!"  Objective: Vital signs in last 24 hours: Temp:  [97.8 F (36.6 C)-98.2 F (36.8 C)] 98 F (36.7 C) (05/26 0746) Pulse Rate:  [54-81] 74 (05/26 0746) Resp:  [12-20] 18 (05/26 0746) BP: (104-153)/(53-84) 104/53 mmHg (05/26 0746) SpO2:  [94 %-100 %] 95 % (05/26 0746) Weight:  [79.379 kg (175 lb)] 79.379 kg (175 lb) (05/25 0919)  Intake/Output from previous day: 05/25 0701 - 05/26 0700 In: -  Out: 100 [Blood:100] Intake/Output this shift:    Alert, conversant. Reports no leg pain today. Ambulated last night. Incision without erythema, swelling, or drainage.  Honeycomb intact over Dermabond. Good strength BLE.   Lab Results: No results for input(s): WBC, HGB, HCT, PLT in the last 72 hours. BMET No results for input(s): NA, K, CL, CO2, GLUCOSE, BUN, CREATININE, CALCIUM in the last 72 hours.  Studies/Results: Dg Lumbar Spine 2-3 Views  09/16/2015  CLINICAL DATA:  L4-5 laminectomy EXAM: LUMBAR SPINE - 2-3 VIEW COMPARISON:  07/03/2015 FINDINGS: Intraoperative localization probe projects posterior to the spinous processes at the L4-5 disc space level. IMPRESSION: Intraoperative localization Electronically Signed   By: Skipper Cliche M.D.   On: 09/16/2015 14:25    Assessment/Plan: improved   LOS: 1 day  Per DrStern, d/c IV, d/c to home. Rx Percocet to chart for prn use. Pt will call office to schedule 3-4 week f/u appt. She verbalizes understanding of d/c instructions.   Verdis Prime 09/17/2015, 8:37 AM

## 2015-09-17 NOTE — Discharge Summary (Signed)
Physician Discharge Summary  Patient ID: Ashley Griffith MRN: [id number] DOB/AGE: 1945/08/20 70 y.o.  Admit date: 09/16/2015 Discharge date: 09/17/2015  Admission Diagnoses: Synovial cyst L 45 Right with stenosis and radiculopathy, spondylosis   Discharge Diagnoses: Synovial cyst L 45 Right with stenosis and radiculopathy, spondylosis s/p Right Lumbar Four-FiveLaminectomy with resection of synovial cyst (Right) - right with microdissection  Active Problems:   Synovial cyst of lumbar facet joint   Discharged Condition: good  Hospital Course: Ashley Griffith was admitted for surgery with dx radiculopathy from synovial cyst. Following uncomplicated cyst resection with laminectomy, she recovered well and transferred to [facility] for nursing care. She is mobilizing well.    Consults: None  Significant Diagnostic Studies: radiology: X-Ray: intra-op  Treatments: surgery: Right Lumbar Four-FiveLaminectomy with resection of synovial cyst (Right) - right with microdissection   Discharge Exam: Blood pressure 104/53, pulse 74, temperature 98 F (36.7 C), temperature source Oral, resp. rate 18, height 5\' 5"  (1.651 m), weight 79.379 kg (175 lb), SpO2 95 %. Alert, conversant. Reports no leg pain today. Ambulated last night. Incision without erythema, swelling, or drainage.  Honeycomb intact over Dermabond. Good strength BLE.    Disposition: 01-Home or Self Care  Rx Percocet to chart for prn use. Pt will call office to schedule 3-4 week f/u appt. She verbalizes understanding of d/c instructions. She may resume Methotrexate & Xarelto 1 week post-op.       Medication List    STOP taking these medications        ENBREL SURECLICK 50 MG/ML injection  Generic drug:  etanercept     methotrexate 2.5 MG tablet  Commonly known as:  RHEUMATREX     XARELTO 20 MG Tabs tablet  Generic drug:  rivaroxaban      TAKE these medications        acetaminophen 650 MG CR tablet  Commonly known as:   TYLENOL  Take 650 mg by mouth 2 (two) times daily as needed for pain.     atorvastatin 10 MG tablet  Commonly known as:  LIPITOR  Take 10 mg by mouth every morning.     buPROPion 150 MG 24 hr tablet  Commonly known as:  WELLBUTRIN XL  Take 150 mg by mouth daily before breakfast.     CALCIUM CITRATE + D PO  Take 1 tablet by mouth at bedtime.     celecoxib 200 MG capsule  Commonly known as:  CELEBREX  Take 200 mg by mouth 2 (two) times daily.     clindamycin 1 % gel  Commonly known as:  CLINDAGEL  Apply 1 application topically 2 (two) times daily.     diazepam 2 MG tablet  Commonly known as:  VALIUM  Take 2 mg by mouth at bedtime as needed for anxiety or sedation.     fluticasone 50 MCG/ACT nasal spray  Commonly known as:  FLONASE  Place 1 spray into the nose daily as needed for allergies.     folic acid [id number] MCG tablet  Commonly known as:  FOLVITE  Take 800 mcg by mouth every morning.     MAALOX 600 MG chewable tablet  Generic drug:  Calcium Carbonate Antacid  Chew 1,800-2,400 mg by mouth at bedtime.     omega-3 acid ethyl esters 1 g capsule  Commonly known as:  LOVAZA  Take 1 g by mouth 2 (two) times daily.     oxyCODONE-acetaminophen 5-325 MG tablet  Commonly known as:  PERCOCET/ROXICET  Take 1-2 tablets  by mouth every 4 (four) hours as needed for moderate pain or severe pain.     SYNTHROID 88 MCG tablet  Generic drug:  levothyroxine  Take 88 mcg by mouth daily.     THERA TEARS NUTRITION PO  Take 3 tablets by mouth daily.           Follow-up Information    Follow up with Peggyann Shoals, MD.   Specialty:  Neurosurgery   Contact information:   [address] [place] [address] [phone]       Signed: Verdis Prime 09/17/2015, 8:41 AM

## 2015-09-17 NOTE — Progress Notes (Signed)
Patient alert and oriented, mae's well, voiding adequate amount of urine, swallowing without difficulty, c/o mild pain. Patient discharged home with family. Script and discharged instructions given to patient. Patient and family stated understanding of d/c instructions given and has an appointment with MD.   

## 2015-09-17 NOTE — Evaluation (Signed)
Physical Therapy Evaluation Patient Details Name: Ashley Griffith MRN: [id number] DOB: 02-Mar-1946 Today's Date: 09/17/2015   History of Present Illness  Pt is a 70 y.o. female s/p R L4-5 laminectomy with resection of synovial cyst. PMH significant for arthritis, GERD, anxiety, depression, hx of DVT and PE.  Clinical Impression  Pt admitted with above diagnosis. Pt currently with functional limitations due to the deficits listed below (see PT Problem List). At the time of PT eval pt was able to perform transfers and ambulation with min guard to supervision for safety. VC's throughout session required for maintenance of back precautions. Pt will benefit from skilled PT to increase their independence and safety with mobility to allow discharge to the venue listed below.       Follow Up Recommendations Outpatient PT;Supervision for mobility/OOB    Equipment Recommendations  None recommended by PT    Recommendations for Other Services       Precautions / Restrictions Precautions Precautions: Fall;Back Precaution Booklet Issued: Yes (comment) Precaution Comments: Reviewed handout. Pt was cued for precautions during functional mobility.  Restrictions Weight Bearing Restrictions: No      Mobility  Bed Mobility Overal bed mobility: Needs Assistance Bed Mobility: Rolling;Sidelying to Sit Rolling: Supervision Sidelying to sit: Supervision     Sit to sidelying: Supervision General bed mobility comments: Close supervision for safety. Pt was able to complete log roll without assist.   Transfers Overall transfer level: Needs assistance Equipment used: None Transfers: Sit to/from Stand Sit to Stand: Min guard         General transfer comment: Min guard for safety. Pt was able to power-up to full standing position without assist. Increased time due to nausea and unsteadiness.   Ambulation/Gait Ambulation/Gait assistance: Min guard;Supervision Ambulation Distance (Feet): 300  Feet Assistive device: None Gait Pattern/deviations: Step-through pattern;Decreased stride length;Trunk flexed Gait velocity: Decreased Gait velocity interpretation: Below normal speed for age/gender General Gait Details: Pt reporting nausea throughout session. Pt was able to ambulate well however noted occasional instances of unsteadiness in which close guard was provided until pt recovered. No therapist assistance was required.   Stairs Stairs: Yes Stairs assistance: Min guard Stair Management: One rail Right;Step to pattern;Forwards Number of Stairs: 10 General stair comments: VC's for sequencing and general safety with stair negotiation. One small trip as foot caught the step during step-up, however pt was able to recover without therapist assistance.   Wheelchair Mobility    Modified Rankin (Stroke Patients Only)       Balance Overall balance assessment: Needs assistance Sitting-balance support: Feet supported;No upper extremity supported Sitting balance-Leahy Scale: Good     Standing balance support: No upper extremity supported;During functional activity Standing balance-Leahy Scale: Fair                               Pertinent Vitals/Pain Pain Assessment: Faces Pain Score: 2  Faces Pain Scale: Hurts little more Pain Location: Incision site Pain Descriptors / Indicators: Operative site guarding;Sore Pain Intervention(s): Limited activity within patient's tolerance;Monitored during session;Repositioned    Home Living Family/patient expects to be discharged to:: Private residence Living Arrangements: Spouse/significant other Available Help at Discharge: Family;Available 24 hours/day Type of Home: House Home Access: Stairs to enter Entrance Stairs-Rails: None (Grab bar on L) Entrance Stairs-Number of Steps: 2 Home Layout: Two level Home Equipment: Shower seat - built in;Bedside commode;Walker - 2 wheels;Hand held shower head;Grab bars - tub/shower  Prior Function Level of Independence: Independent               Hand Dominance   Dominant Hand: Left    Extremity/Trunk Assessment   Upper Extremity Assessment: Defer to OT evaluation           Lower Extremity Assessment: Generalized weakness      Cervical / Trunk Assessment: Other exceptions  Communication   Communication: No difficulties  Cognition Arousal/Alertness: Awake/alert Behavior During Therapy: WFL for tasks assessed/performed Overall Cognitive Status: Within Functional Limits for tasks assessed                      General Comments      Exercises        Assessment/Plan    PT Assessment Patient needs continued PT services  PT Diagnosis Difficulty walking;Acute pain   PT Problem List Decreased strength;Decreased range of motion;Decreased activity tolerance;Decreased balance;Decreased mobility;Decreased knowledge of use of DME;Decreased safety awareness;Decreased knowledge of precautions;Pain  PT Treatment Interventions DME instruction;Gait training;Stair training;Functional mobility training;Therapeutic activities;Therapeutic exercise;Neuromuscular re-education;Patient/family education   PT Goals (Current goals can be found in the Care Plan section) Acute Rehab PT Goals Patient Stated Goal: to be independent  PT Goal Formulation: With patient Time For Goal Achievement: 09/24/15 Potential to Achieve Goals: Good    Frequency Min 5X/week   Barriers to discharge        Co-evaluation               End of Session Equipment Utilized During Treatment: Gait belt Activity Tolerance: Patient limited by fatigue Patient left: in chair;with call bell/phone within reach Nurse Communication: Mobility status         Time: [id number] PT Time Calculation (min) (ACUTE ONLY): 42 min   Charges:   PT Evaluation $PT Eval Moderate Complexity: 1 Procedure PT Treatments $Gait Training: 23-37 mins   PT G Codes:        Rolinda Roan October 05, 2015, 12:17 PM   Rolinda Roan, PT, DPT Acute Rehabilitation Services Pager: [phone]

## 2015-09-17 NOTE — Progress Notes (Signed)
Occupational Therapy Evaluation/Discharge Patient Details Name: Ashley Griffith MRN: [id number] DOB: 23-Apr-1946 Today's Date: 09/17/2015    History of Present Illness Pt is a 70 y.o. female s/p R L4-5 laminectomy with resection of synovial cyst. PMH significant for arthritis, GERD, anxiety, depression, hx of DVT and PE.   Clinical Impression   PTA, pt was independent with ADLs and mobility. Pt currently requires supervision level assist for all ADLs and transfers. Educated pt on back precautions, positioning for rest, activity restrictions and gradual progression, energy conservation, compensatory strategies for LB ADLs, use of AE, and fall prevention strategies. Pt plans to d/c home with 24/7 assistance from her husband. All education has been completed and pt has no further questions. Pt with no further acute OT needs. OT signing off. Thank you for this referral.    Follow Up Recommendations  No OT follow up;Supervision - Intermittent    Equipment Recommendations  None recommended by OT    Recommendations for Other Services       Precautions / Restrictions Precautions Precautions: Fall;Back Precaution Booklet Issued: Yes (comment) Precaution Comments: Reviewed handout. Pt was cued for precautions during functional mobility.  Restrictions Weight Bearing Restrictions: No      Mobility Bed Mobility Overal bed mobility: Needs Assistance Bed Mobility: Rolling;Sidelying to Sit;Sit to Sidelying Rolling: Supervision Sidelying to sit: Supervision     Sit to sidelying: Supervision General bed mobility comments: VCs for log roll technique.  Transfers Overall transfer level: Needs assistance Equipment used: None Transfers: Sit to/from Stand Sit to Stand: Supervision         General transfer comment: Supervision for safety. No physical assist required. No overt LOB or reports of dizziness upon standing.    Balance Overall balance assessment: Needs  assistance Sitting-balance support: No upper extremity supported;Feet supported Sitting balance-Leahy Scale: Good     Standing balance support: No upper extremity supported;During functional activity Standing balance-Leahy Scale: Good                              ADL Overall ADL's : Needs assistance/impaired     Grooming: Wash/dry hands;Supervision/safety;Standing   Upper Body Bathing: Supervision/ safety;Sitting   Lower Body Bathing: Supervison/ safety;Sit to/from stand;Cueing for compensatory techniques Lower Body Bathing Details (indicate cue type and reason): able to cross ankle-over-knee, has long-handled sponge at home Upper Body Dressing : Supervision/safety;Sitting   Lower Body Dressing: Supervision/safety;Sit to/from stand;Cueing for compensatory techniques Lower Body Dressing Details (indicate cue type and reason): able to cross ankle-over-knee Toilet Transfer: Supervision/safety;Cueing for safety;Ambulation;BSC Toilet Transfer Details (indicate cue type and reason): BSC over toilet, cues to feel BSC on back of legs before reaching back to sit to avoid twisting Toileting- Clothing Manipulation and Hygiene: Supervision/safety;Sit to/from stand Toileting - Clothing Manipulation Details (indicate cue type and reason): educated on technique for pericare to avoid twisting Tub/ Shower Transfer: Walk-in shower;Supervision/safety;Cueing for safety;Ambulation Tub/Shower Transfer Details (indicate cue type and reason): advised pt to have husband there to assist getting in/out of shower Functional mobility during ADLs: Supervision/safety General ADL Comments: Reviewed back precautions and activity restrictions/progression. Educated pt  on compensatory strategies, use of AE, postioning when at rest, pain/edema management, and fall prevention strateiges. No family present for OT eval.     Vision Vision Assessment?: No apparent visual deficits   Perception     Praxis       Pertinent Vitals/Pain Pain Assessment: 0-10 Pain Score: 2  Pain Location: incision site  Pain Descriptors / Indicators: Sore Pain Intervention(s): Limited activity within patient's tolerance;Premedicated before session;Monitored during session;Repositioned     Hand Dominance Left (Writes with L hand, does everything else with R)   Extremity/Trunk Assessment Upper Extremity Assessment Upper Extremity Assessment: Overall WFL for tasks assessed   Lower Extremity Assessment Lower Extremity Assessment: Defer to PT evaluation   Cervical / Trunk Assessment Cervical / Trunk Assessment: Normal   Communication Communication Communication: No difficulties   Cognition Arousal/Alertness: Awake/alert Behavior During Therapy: WFL for tasks assessed/performed Overall Cognitive Status: Within Functional Limits for tasks assessed                     General Comments       Exercises       Shoulder Instructions      Home Living Family/patient expects to be discharged to:: Private residence Living Arrangements: Spouse/significant other Available Help at Discharge: Family;Available 24 hours/day Type of Home: House Home Access: Stairs to enter [employer] of Steps: 2 Entrance Stairs-Rails: None (Grab bar on L) Home Layout: Two level Alternate Level Stairs-Number of Steps: Flight Alternate Level Stairs-Rails: Right Bathroom Shower/Tub: Occupational psychologist: Standard Bathroom Accessibility: Yes How Accessible: Accessible via walker Home Equipment: Shower seat - built in;Bedside commode Adaptive Equipment: Reacher;Long-handled sponge        Prior Functioning/Environment Level of Independence: Independent             OT Diagnosis: Acute pain   OT Problem List: Decreased strength;Decreased range of motion;Decreased activity tolerance;Impaired balance (sitting and/or standing);Decreased safety awareness;Decreased knowledge of use of DME or  AE;Decreased knowledge of precautions;Pain   OT Treatment/Interventions:      OT Goals(Current goals can be found in the care plan section) Acute Rehab OT Goals Patient Stated Goal: to be independent  OT Goal Formulation: With patient Time For Goal Achievement: 10/01/15 Potential to Achieve Goals: Good  OT Frequency:     Barriers to D/C:            Co-evaluation              End of Session Equipment Utilized During Treatment: Gait belt Nurse Communication: Mobility status  Activity Tolerance: Patient tolerated treatment well Patient left: in chair;with call bell/phone within reach   Time: 1009-1044 OT Time Calculation (min): 35 min Charges:  OT General Charges $OT Visit: 1 Procedure OT Evaluation $OT Eval Low Complexity: 1 Procedure OT Treatments $Self Care/Home Management : 8-22 mins G-Codes:    Redmond Baseman, OTR/L Pager: [phone] 09/17/2015, 11:04 AM

## 2015-09-21 NOTE — Anesthesia Postprocedure Evaluation (Signed)
Anesthesia Post Note  Patient: Ashley Griffith  Procedure(s) Performed: Procedure(s) (LRB): Right Lumbar Four-FiveLaminectomy with resection of synovial cyst (Right)  Patient location during evaluation: PACU Anesthesia Type: General Level of consciousness: awake and alert Pain management: pain level controlled Vital Signs Assessment: post-procedure vital signs reviewed and stable Respiratory status: spontaneous breathing, nonlabored ventilation, respiratory function stable and patient connected to nasal cannula oxygen Cardiovascular status: blood pressure returned to baseline and stable Postop Assessment: no signs of nausea or vomiting Anesthetic complications: no     Last Vitals:  Filed Vitals:   09/17/15 0344 09/17/15 0746  BP: 113/72 104/53  Pulse: 81 74  Temp: 36.7 C 36.7 C  Resp: 18 18    Last Pain:  Filed Vitals:   09/17/15 0751  PainSc: 3    Pain Goal: Patients Stated Pain Goal: 3 (09/17/15 0359)               [name]

## 2015-09-23 DIAGNOSIS — H04123 Dry eye syndrome of bilateral lacrimal glands: Secondary | ICD-10-CM | POA: Diagnosis not present

## 2015-09-23 DIAGNOSIS — Z961 Presence of intraocular lens: Secondary | ICD-10-CM | POA: Diagnosis not present

## 2015-10-25 DIAGNOSIS — L4050 Arthropathic psoriasis, unspecified: Secondary | ICD-10-CM | POA: Diagnosis not present

## 2015-10-25 DIAGNOSIS — Z86711 Personal history of pulmonary embolism: Secondary | ICD-10-CM | POA: Diagnosis not present

## 2015-10-25 DIAGNOSIS — Z0000 Encounter for general adult medical examination without abnormal findings: Secondary | ICD-10-CM | POA: Diagnosis not present

## 2015-10-25 DIAGNOSIS — E039 Hypothyroidism, unspecified: Secondary | ICD-10-CM | POA: Diagnosis not present

## 2015-10-25 DIAGNOSIS — F419 Anxiety disorder, unspecified: Secondary | ICD-10-CM | POA: Diagnosis not present

## 2015-10-25 DIAGNOSIS — Z7901 Long term (current) use of anticoagulants: Secondary | ICD-10-CM | POA: Diagnosis not present

## 2015-10-25 DIAGNOSIS — E785 Hyperlipidemia, unspecified: Secondary | ICD-10-CM | POA: Diagnosis not present

## 2015-10-27 DIAGNOSIS — Z1231 Encounter for screening mammogram for malignant neoplasm of breast: Secondary | ICD-10-CM | POA: Diagnosis not present

## 2015-10-29 DIAGNOSIS — R928 Other abnormal and inconclusive findings on diagnostic imaging of breast: Secondary | ICD-10-CM | POA: Diagnosis not present

## 2015-10-29 DIAGNOSIS — N6489 Other specified disorders of breast: Secondary | ICD-10-CM | POA: Diagnosis not present

## 2015-10-29 DIAGNOSIS — R922 Inconclusive mammogram: Secondary | ICD-10-CM | POA: Diagnosis not present

## 2015-11-15 DIAGNOSIS — L4050 Arthropathic psoriasis, unspecified: Secondary | ICD-10-CM | POA: Diagnosis not present

## 2015-11-15 DIAGNOSIS — M1710 Unilateral primary osteoarthritis, unspecified knee: Secondary | ICD-10-CM | POA: Diagnosis not present

## 2015-11-15 DIAGNOSIS — L408 Other psoriasis: Secondary | ICD-10-CM | POA: Diagnosis not present

## 2015-11-15 DIAGNOSIS — Z79899 Other long term (current) drug therapy: Secondary | ICD-10-CM | POA: Diagnosis not present

## 2015-11-15 DIAGNOSIS — R21 Rash and other nonspecific skin eruption: Secondary | ICD-10-CM | POA: Diagnosis not present

## 2015-12-14 DIAGNOSIS — E032 Hypothyroidism due to medicaments and other exogenous substances: Secondary | ICD-10-CM | POA: Diagnosis not present

## 2015-12-16 DIAGNOSIS — L718 Other rosacea: Secondary | ICD-10-CM | POA: Diagnosis not present

## 2015-12-16 DIAGNOSIS — L308 Other specified dermatitis: Secondary | ICD-10-CM | POA: Diagnosis not present

## 2015-12-16 DIAGNOSIS — L720 Epidermal cyst: Secondary | ICD-10-CM | POA: Diagnosis not present

## 2015-12-16 DIAGNOSIS — L821 Other seborrheic keratosis: Secondary | ICD-10-CM | POA: Diagnosis not present

## 2015-12-21 DIAGNOSIS — E032 Hypothyroidism due to medicaments and other exogenous substances: Secondary | ICD-10-CM | POA: Diagnosis not present

## 2015-12-21 DIAGNOSIS — F419 Anxiety disorder, unspecified: Secondary | ICD-10-CM | POA: Diagnosis not present

## 2015-12-28 DIAGNOSIS — Z1389 Encounter for screening for other disorder: Secondary | ICD-10-CM | POA: Diagnosis not present

## 2015-12-28 DIAGNOSIS — F322 Major depressive disorder, single episode, severe without psychotic features: Secondary | ICD-10-CM | POA: Diagnosis not present

## 2016-01-07 DIAGNOSIS — L408 Other psoriasis: Secondary | ICD-10-CM | POA: Diagnosis not present

## 2016-01-07 DIAGNOSIS — Z79899 Other long term (current) drug therapy: Secondary | ICD-10-CM | POA: Diagnosis not present

## 2016-01-07 DIAGNOSIS — R21 Rash and other nonspecific skin eruption: Secondary | ICD-10-CM | POA: Diagnosis not present

## 2016-01-07 DIAGNOSIS — M7989 Other specified soft tissue disorders: Secondary | ICD-10-CM | POA: Diagnosis not present

## 2016-01-07 DIAGNOSIS — L4050 Arthropathic psoriasis, unspecified: Secondary | ICD-10-CM | POA: Diagnosis not present

## 2016-01-07 DIAGNOSIS — M1710 Unilateral primary osteoarthritis, unspecified knee: Secondary | ICD-10-CM | POA: Diagnosis not present

## 2016-01-19 DIAGNOSIS — M1712 Unilateral primary osteoarthritis, left knee: Secondary | ICD-10-CM | POA: Diagnosis not present

## 2016-01-19 DIAGNOSIS — M1711 Unilateral primary osteoarthritis, right knee: Secondary | ICD-10-CM | POA: Diagnosis not present

## 2016-01-21 DIAGNOSIS — Z23 Encounter for immunization: Secondary | ICD-10-CM | POA: Diagnosis not present

## 2016-02-08 DIAGNOSIS — F322 Major depressive disorder, single episode, severe without psychotic features: Secondary | ICD-10-CM | POA: Diagnosis not present

## 2016-02-16 DIAGNOSIS — S60222A Contusion of left hand, initial encounter: Secondary | ICD-10-CM | POA: Diagnosis not present

## 2016-02-23 DIAGNOSIS — S62325A Displaced fracture of shaft of fourth metacarpal bone, left hand, initial encounter for closed fracture: Secondary | ICD-10-CM | POA: Diagnosis not present

## 2016-03-08 DIAGNOSIS — L408 Other psoriasis: Secondary | ICD-10-CM | POA: Diagnosis not present

## 2016-03-08 DIAGNOSIS — R21 Rash and other nonspecific skin eruption: Secondary | ICD-10-CM | POA: Diagnosis not present

## 2016-03-08 DIAGNOSIS — L4050 Arthropathic psoriasis, unspecified: Secondary | ICD-10-CM | POA: Diagnosis not present

## 2016-03-08 DIAGNOSIS — M1710 Unilateral primary osteoarthritis, unspecified knee: Secondary | ICD-10-CM | POA: Diagnosis not present

## 2016-03-08 DIAGNOSIS — Z79899 Other long term (current) drug therapy: Secondary | ICD-10-CM | POA: Diagnosis not present

## 2016-03-13 DIAGNOSIS — S62325D Displaced fracture of shaft of fourth metacarpal bone, left hand, subsequent encounter for fracture with routine healing: Secondary | ICD-10-CM | POA: Diagnosis not present

## 2016-03-22 DIAGNOSIS — Z1389 Encounter for screening for other disorder: Secondary | ICD-10-CM | POA: Diagnosis not present

## 2016-03-22 DIAGNOSIS — F419 Anxiety disorder, unspecified: Secondary | ICD-10-CM | POA: Diagnosis not present

## 2016-03-22 DIAGNOSIS — J3489 Other specified disorders of nose and nasal sinuses: Secondary | ICD-10-CM | POA: Diagnosis not present

## 2016-03-23 DIAGNOSIS — M170 Bilateral primary osteoarthritis of knee: Secondary | ICD-10-CM | POA: Diagnosis not present

## 2016-03-27 DIAGNOSIS — S62325D Displaced fracture of shaft of fourth metacarpal bone, left hand, subsequent encounter for fracture with routine healing: Secondary | ICD-10-CM | POA: Diagnosis not present

## 2016-03-31 DIAGNOSIS — M170 Bilateral primary osteoarthritis of knee: Secondary | ICD-10-CM | POA: Diagnosis not present

## 2016-04-06 DIAGNOSIS — M170 Bilateral primary osteoarthritis of knee: Secondary | ICD-10-CM | POA: Diagnosis not present

## 2016-06-08 ENCOUNTER — Encounter: Payer: Self-pay | Admitting: Pulmonary Disease

## 2016-06-08 ENCOUNTER — Ambulatory Visit (INDEPENDENT_AMBULATORY_CARE_PROVIDER_SITE_OTHER): Payer: Medicare Other | Admitting: Pulmonary Disease

## 2016-06-08 DIAGNOSIS — R052 Subacute cough: Secondary | ICD-10-CM | POA: Insufficient documentation

## 2016-06-08 DIAGNOSIS — R05 Cough: Secondary | ICD-10-CM | POA: Diagnosis not present

## 2016-06-08 DIAGNOSIS — R0609 Other forms of dyspnea: Secondary | ICD-10-CM | POA: Diagnosis not present

## 2016-06-08 NOTE — Assessment & Plan Note (Signed)
Not clear what is causing her cough and shortness of breath  We will treat you for reflux and sinus drip  -Take Prilosec daily for 4 weeks -Take storebrand chlorpheniramine 4 mg at bedtime  If cough is no better in 2-3 weeks, call back and we will proceed with CAT scan to look for methotrexate injury to your lungs

## 2016-06-08 NOTE — Patient Instructions (Signed)
Not clear what is causing her cough and shortness of breath  We will treat you for reflux and sinus drip  -Take Prilosec daily for 4 weeks -Take storebrand chlorpheniramine 4 mg at bedtime  If cough is no better in 2-3 weeks, call back and we will proceed with CAT scan to look for methotrexate injury to your lungs

## 2016-06-08 NOTE — Progress Notes (Signed)
Subjective:    Patient ID: Ashley Griffith, female    DOB: May 25, 1945, 71 y.o.   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  She reports a dry cough of insidious onset about 3 weeks ago, no URI symptoms or fevers at onset. She reports a tickle in her throat and an occasional wheeze. She denies seasonal allergies. She also reports dyspnea on exertion for about 3 weeks, especially on exercise or climbing stairs, improved with rest. Her symptoms are similar to when she had a blood clot in her lungs in 1995. She had a recurrent DVT in 8/96 and has been maintained on anticoagulation since then, about 2 years ago she changed to Xarelto and reports compliance with this medication   she reports occasional heartburn for which she takes Nexium. She denies postnasal drip or childhood history of asthma. No history of environmental exposure  She has psoriatic arthritis for which she is maintained on methotrexate once a week and Humira every 2 weeks , this is in remission.   Chest x-ray 05/31/16 did not show any infiltrates or effusion, I reviewed this film    Past Medical History:  Diagnosis Date  . Arthritis   . Blood clot in vein    leg 11-23-1004  . Depression   . GERD (gastroesophageal reflux disease)   . Hemorrhoids    Patient notes intermittent rectal bleeding from these  . Hypothyroidism    Seen by Dr. Wilson Singer  . Obesity    Status post significant intentional weight loss in 2003 of > 90 pounds  . PONV (postoperative nausea and vomiting)   . Psoriatic arthritis ([place])    Follows with Dr. Naida Sleight  . Pulmonary embolism ([place]) march 1995  . Rosacea     Past Surgical History:  Procedure Laterality Date  . blood mass removed from abdomen  1972   ? questionable ectopic pregnancy  . childbirth  1976  . colonscopy     x 2  . KNEE ARTHROSCOPY Right 09/18/2012   Procedure: RIGHT ARTHROSCOPY KNEE, Tlateral  meniscal debdridement and chondroplasty;  Surgeon: Gearlean Alf, MD;  Location: [facility];  Service: Orthopedics;  Laterality: Right;  . LUMBAR LAMINECTOMY/DECOMPRESSION MICRODISCECTOMY Right 09/16/2015   Procedure: Right Lumbar Four-FiveLaminectomy with resection of synovial cyst;  Surgeon: Erline Levine, MD;  Location: [facility];  Service: Neurosurgery;  Laterality: Right;  right  . nasal poly removed  age 56  . sclorotomy both legs veins  2005 or 2006  . SPHINCTEROTOMY  1990's  . TONSILLECTOMY  as child    Allergies  Allergen Reactions  . Erythromycin Hives and Nausea And Vomiting  . Monistat [Miconazole] Itching    Burning  . Thimerosal Itching    Red and blurry eyes  . Adhesive [Tape] Rash  . Penicillins Swelling and Rash    Has patient had a PCN reaction causing immediate rash, facial/tongue/throat swelling, SOB or lightheadedness with hypotension: Yes Has patient had a PCN reaction causing severe rash involving mucus membranes or skin necrosis: No Has patient had a PCN reaction that required hospitalization No Has patient had a PCN reaction occurring within the last 10 years: No If all of the above answers are "NO", then may proceed with Cephalosporin use.     Social History   Social History  . Marital status: Married    Spouse name: N/A  . Number of children: N/A  . Years of education: N/A  Occupational History  . Not on file.   Social History Main Topics  . Smoking status: Former Smoker    Packs/day: 1.00    Years: 5.00    Types: Cigarettes    Quit date: 04/24/1973  . Smokeless tobacco: Never Used  . Alcohol use Yes     Comment: social  . Drug use: No  . Sexual activity: Not on file   Other Topics Concern  . Not on file   Social History Narrative  . No narrative on file     Family History  Problem Relation Age of Onset  . Kidney failure Mother     At age 81  . Heart attack Father     At age 49  . Acute myelogenous leukemia Brother     At age 7   . Cancer Maternal Aunt     Breast cancer    Review of Systems Positive for shortness of breath with activity, dry cough, acid heartburn, anxiety depression joint pains and weight fluctuation  Constitutional: negative for anorexia, fevers and sweats  Eyes: negative for irritation, redness and visual disturbance  Ears, nose, mouth, throat, and face: negative for earaches, epistaxis, nasal congestion and sore throat  Respiratory: negative for sputum and wheezing  Cardiovascular: negative for chest pain, dyspnea, lower extremity edema, orthopnea, palpitations and syncope  Gastrointestinal: negative for abdominal pain, constipation, diarrhea, melena, nausea and vomiting  Genitourinary:negative for dysuria, frequency and hematuria  Hematologic/lymphatic: negative for bleeding, easy bruising and lymphadenopathy  Musculoskeletal:negative for arthralgias, muscle weakness and stiff joints  Neurological: negative for coordination problems, gait problems, headaches and weakness  Endocrine: negative for diabetic symptoms including polydipsia, polyuria and weight loss     Objective:   Physical Exam  Gen. Pleasant, well-nourished, in no distress, normal affect ENT - no lesions, no post nasal drip Neck: No JVD, no thyromegaly, no carotid bruits Lungs: no use of accessory muscles, no dullness to percussion, clear without rales or rhonchi  Cardiovascular: Rhythm regular, heart sounds  normal, no murmurs or gallops, no peripheral edema Abdomen: soft and non-tender, no hepatosplenomegaly, BS normal. Musculoskeletal: No deformities, no cyanosis or clubbing Neuro:  alert, non focal       Assessment & Plan:

## 2016-06-08 NOTE — Assessment & Plan Note (Signed)
PFTs if not improved within a few weeks

## 2016-07-05 ENCOUNTER — Ambulatory Visit (INDEPENDENT_AMBULATORY_CARE_PROVIDER_SITE_OTHER): Payer: Medicare Other | Admitting: Adult Health

## 2016-07-05 ENCOUNTER — Encounter: Payer: Self-pay | Admitting: Adult Health

## 2016-07-05 VITALS — BP 124/74 | HR 61 | Ht 65.0 in | Wt 159.0 lb

## 2016-07-05 DIAGNOSIS — R05 Cough: Secondary | ICD-10-CM

## 2016-07-05 DIAGNOSIS — R0609 Other forms of dyspnea: Secondary | ICD-10-CM | POA: Diagnosis not present

## 2016-07-05 DIAGNOSIS — R052 Subacute cough: Secondary | ICD-10-CM

## 2016-07-05 NOTE — Assessment & Plan Note (Signed)
Cough -GERD/AR triggers  Much improved  Normal spirometry  /Reported CXR nml  Doubt this is MTX /RA lung issues   Plan  Patient Instructions  Continue on Prilosec daily for 6 weeks then change As needed  Reflux /Cough return .  Continue on Chlortrimeton 4mg  At bedtime  For 6 weeks then change As needed  For drainage, throat clearing , cough .  Follow up with Dr. Elsworth Soho  In 2 months and As needed   Please contact office for sooner follow up if symptoms do not improve or worsen or seek emergency care

## 2016-07-05 NOTE — Progress Notes (Signed)
@Patient  ID: Ashley Griffith, female    DOB: 1946/04/06, 71 y.o.   MRN: [id number]  Chief Complaint  Patient presents with  . 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 Patient was seen last visit for a pulmonary consult for cough. Chest x-ray did not show any infiltrates or effusion on 05/31/2016. Patient was treated for subacute cough with possible GERD and allergic rhinitis triggers. She was started on Prilosec and Chlor-Trimeton.  She is feeling much better . Cough is nearly gone . She can now taking in deep breath without coughing . Dspnea is less.  Spirometry today is normal with an FEV1 at 91%, ratio 73, FVC 94%    Allergies  Allergen Reactions  . Erythromycin Hives and Nausea And Vomiting  . Monistat [Miconazole] Itching    Burning  . Thimerosal Itching    Red and blurry eyes  . Adhesive [Tape] Rash  . Penicillins Swelling and Rash    Has patient had a PCN reaction causing immediate rash, facial/tongue/throat swelling, SOB or lightheadedness with hypotension: Yes Has patient had a PCN reaction causing severe rash involving mucus membranes or skin necrosis: No Has patient had a PCN reaction that required hospitalization No Has patient had a PCN reaction occurring within the last 10 years: No If all of the above answers are "NO", then may proceed with Cephalosporin use.     Immunization History  Administered Date(s) Administered  . Influenza, High Dose Seasonal PF 12/24/2015    Past Medical History:  Diagnosis Date  . Arthritis   . Blood clot in vein    leg 11-23-1004  . Depression    . GERD (gastroesophageal reflux disease)   . Hemorrhoids    Patient notes intermittent rectal bleeding from these  . Hypothyroidism    Seen by Dr. Wilson Singer  . Obesity    Status post significant intentional weight loss in 2003 of > 90 pounds  . PONV (postoperative nausea and vomiting)   . Psoriatic arthritis ([place])    Follows with Dr. Naida Sleight  . Pulmonary embolism ([place]) march 1995  . Rosacea     Tobacco History: History  Smoking Status  . Former Smoker  . Packs/day: 1.00  . Years: 5.00  . Types: Cigarettes  . Quit date: 04/24/1973  Smokeless Tobacco  . Never Used   Counseling given: Not Answered   Outpatient Encounter Prescriptions as of 07/05/2016  Medication Sig  . acetaminophen (TYLENOL) 650 MG CR tablet Take 650 mg by mouth 2 (two) times daily as needed for pain.  . Adalimumab (HUMIRA) 40 MG/0.8ML PSKT Inject into the skin.  Marland Kitchen atorvastatin (LIPITOR) 10 MG tablet Take 10 mg by mouth every morning.   Marland Kitchen buPROPion (WELLBUTRIN XL) 150 MG 24 hr tablet Take 150 mg by mouth daily before breakfast.  . Calcium Carbonate Antacid (MAALOX) 600 MG chewable tablet Chew 1,800-2,400 mg by mouth at bedtime.   . Calcium Citrate-Vitamin D (CALCIUM CITRATE + D PO) Take 1 tablet by mouth at bedtime.   . celecoxib (CELEBREX) 200 MG capsule Take 200 mg by mouth 2 (two) times daily.  Marland Kitchen  clindamycin (CLINDAGEL) 1 % gel Apply 1 application topically 2 (two) times daily.  . DHA-EPA-Flaxseed Oil-Vitamin E (THERA TEARS NUTRITION PO) Take 3 tablets by mouth daily.  . diazepam (VALIUM) 2 MG tablet Take 2 mg by mouth at bedtime as needed for anxiety or sedation.   . fluticasone (FLONASE) 50 MCG/ACT nasal spray Place 1 spray into the nose daily as needed for allergies.  . folic acid (FOLVITE) 063 MCG tablet Take 800 mcg by mouth every morning.   . methotrexate (RHEUMATREX) 10 MG tablet Take 10 mg by mouth once a week. Caution: Chemotherapy. Protect from light.  Marland Kitchen omega-3 acid ethyl esters (LOVAZA) 1 G  capsule Take 1 g by mouth 2 (two) times daily.   . rivaroxaban (XARELTO) 20 MG TABS tablet Take 20 mg by mouth daily with supper.  . sertraline (ZOLOFT) 50 MG tablet Take 50 mg by mouth daily.  Marland Kitchen SYNTHROID 88 MCG tablet Take 88 mcg by mouth daily.   No facility-administered encounter medications on file as of 07/05/2016.      Review of Systems  Constitutional:   No  weight loss, night sweats,  Fevers, chills, fatigue, or  lassitude.  HEENT:   No headaches,  Difficulty swallowing,  Tooth/dental problems, or  Sore throat,                No sneezing, itching, ear ache, nasal congestion, post nasal drip,   CV:  No chest pain,  Orthopnea, PND, swelling in lower extremities, anasarca, dizziness, palpitations, syncope.   GI  No heartburn, indigestion, abdominal pain, nausea, vomiting, diarrhea, change in bowel habits, loss of appetite, bloody stools.   Resp: No shortness of breath with exertion or at rest.  No excess mucus, no productive cough,  No non-productive cough,  No coughing up of blood.  No change in color of mucus.  No wheezing.  No chest wall deformity  Skin: no rash or lesions. Hx of psorasis   GU: no dysuria, change in color of urine, no urgency or frequency.  No flank pain, no hematuria        Physical Exam  BP 124/74 (BP Location: Left Arm, Cuff Size: Normal)   Pulse 61   Ht 5\' 5"  (1.651 m)   Wt 159 lb (72.1 kg)   SpO2 100%   BMI 26.46 kg/m   GEN: A/Ox3; pleasant , NAD, well nourished    HEENT:  [place]/AT,  EACs-clear, TMs-wnl, NOSE-clear, THROAT-clear, no lesions, no postnasal drip or exudate noted.   NECK:  Supple w/ fair ROM; no JVD; normal carotid impulses w/o bruits; no thyromegaly or nodules palpated; no lymphadenopathy.    RESP  Clear  P & A; w/o, wheezes/ rales/ or rhonchi. no accessory muscle use, no dullness to percussion  CARD:  RRR, no m/r/g, no peripheral edema, pulses intact, no cyanosis or clubbing.  GI:   Soft & nt; nml bowel sounds; no  organomegaly or masses detected.   Musco: Warm bil, no deformities or joint swelling noted.   Neuro: alert, no focal deficits noted.    Skin: Warm, no lesions or rashes    Lab Results:   BNP No results found for: BNP  ProBNP No results found for: PROBNP  Imaging: No results found.   Assessment & Plan:   Subacute cough Cough -GERD/AR triggers  Much improved  Normal spirometry  /Reported CXR nml  Doubt this is MTX /RA lung issues   Plan  Patient Instructions  Continue on Prilosec daily for  6 weeks then change As needed  Reflux /Cough return .  Continue on Chlortrimeton 4mg  At bedtime  For 6 weeks then change As needed  For drainage, throat clearing , cough .  Follow up with Dr. Elsworth Soho  In 2 months and As needed   Please contact office for sooner follow up if symptoms do not improve or worsen or seek emergency care         Rexene Edison, NP 07/05/2016

## 2016-07-05 NOTE — Patient Instructions (Signed)
Continue on Prilosec daily for 6 weeks then change As needed  Reflux /Cough return .  Continue on Chlortrimeton 4mg  At bedtime  For 6 weeks then change As needed  For drainage, throat clearing , cough .  Follow up with Dr. Elsworth Soho  In 2 months and As needed   Please contact office for sooner follow up if symptoms do not improve or worsen or seek emergency care

## 2016-07-08 NOTE — Progress Notes (Signed)
Reviewed & agree with plan  

## 2016-08-23 DIAGNOSIS — H903 Sensorineural hearing loss, bilateral: Secondary | ICD-10-CM | POA: Insufficient documentation

## 2016-08-23 DIAGNOSIS — IMO0001 Reserved for inherently not codable concepts without codable children: Secondary | ICD-10-CM | POA: Insufficient documentation

## 2016-09-05 ENCOUNTER — Ambulatory Visit (INDEPENDENT_AMBULATORY_CARE_PROVIDER_SITE_OTHER): Payer: Medicare Other | Admitting: Pulmonary Disease

## 2016-09-05 ENCOUNTER — Encounter: Payer: Self-pay | Admitting: Pulmonary Disease

## 2016-09-05 DIAGNOSIS — I8290 Acute embolism and thrombosis of unspecified vein: Secondary | ICD-10-CM

## 2016-09-05 DIAGNOSIS — R05 Cough: Secondary | ICD-10-CM

## 2016-09-05 DIAGNOSIS — R052 Subacute cough: Secondary | ICD-10-CM

## 2016-09-05 NOTE — Assessment & Plan Note (Signed)
Seems to have been induced by GERD-about 90% resolved Prescription for omeprazole for 3 months for reflux-then reassess need for this medication. We discussed modification of diet as mainstay of reflux therapy

## 2016-09-05 NOTE — Progress Notes (Signed)
   Subjective:    Patient ID: Ashley Griffith, female    DOB: 31-Aug-1945, 71 y.o.   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 Denies nasal drip, nasal congestion or seasonal allergies. Reflux was very well controlled on omeprazole. Would like to continue on omeprazole for reflux   She has questions about Xarelto and its interaction with Celebrex   Significant tests/ events reviewed .  Spirometry >> FEV1 at 91%, ratio 73, FVC 94%   Review of Systems neg for any significant sore throat, dysphagia, itching, sneezing, nasal congestion or excess/ purulent secretions, fever, chills, sweats, unintended wt loss, pleuritic or exertional cp, hempoptysis, orthopnea pnd or change in chronic leg swelling. Also denies presyncope, palpitations, heartburn, abdominal pain, nausea, vomiting, diarrhea or change in bowel or urinary habits, dysuria,hematuria, rash, arthralgias, visual complaints, headache, numbness weakness or ataxia.     Objective:   Physical Exam   Gen. Pleasant, well-nourished, in no distress ENT - no thrush, no post nasal drip Neck: No JVD, no thyromegaly, no carotid bruits Lungs: no use of accessory muscles, no dullness to percussion, clear without rales or rhonchi  Cardiovascular: Rhythm regular, heart sounds  normal, no murmurs or gallops, no peripheral edema Musculoskeletal: No deformities, no cyanosis or clubbing         Assessment & Plan:

## 2016-09-05 NOTE — Patient Instructions (Signed)
Prescription for omeprazole for 3 months for reflux-then reassess need for this medication. We discussed modification of diet as mainstay of reflux therapy  Possibility of decreasing Xarelto to 10 mg for maintenance in the future We discussed the interaction of Celebrex and Xarelto

## 2016-09-05 NOTE — Assessment & Plan Note (Signed)
Possibility of decreasing Xarelto to 10 mg for maintenance in the future We discussed the interaction of Celebrex and Xarelto

## 2017-03-28 DIAGNOSIS — M1712 Unilateral primary osteoarthritis, left knee: Secondary | ICD-10-CM | POA: Diagnosis not present

## 2017-03-28 DIAGNOSIS — M1711 Unilateral primary osteoarthritis, right knee: Secondary | ICD-10-CM | POA: Diagnosis not present

## 2017-03-28 DIAGNOSIS — M170 Bilateral primary osteoarthritis of knee: Secondary | ICD-10-CM | POA: Diagnosis not present

## 2017-05-18 DIAGNOSIS — E890 Postprocedural hypothyroidism: Secondary | ICD-10-CM | POA: Diagnosis not present

## 2017-05-22 DIAGNOSIS — E7800 Pure hypercholesterolemia, unspecified: Secondary | ICD-10-CM | POA: Diagnosis not present

## 2017-05-22 DIAGNOSIS — M79672 Pain in left foot: Secondary | ICD-10-CM | POA: Diagnosis not present

## 2017-06-07 ENCOUNTER — Ambulatory Visit: Payer: Medicare HMO | Admitting: Podiatry

## 2017-06-07 ENCOUNTER — Ambulatory Visit (INDEPENDENT_AMBULATORY_CARE_PROVIDER_SITE_OTHER): Payer: Medicare HMO

## 2017-06-07 ENCOUNTER — Telehealth: Payer: Self-pay | Admitting: *Deleted

## 2017-06-07 ENCOUNTER — Encounter: Payer: Self-pay | Admitting: Podiatry

## 2017-06-07 DIAGNOSIS — M7750 Other enthesopathy of unspecified foot: Secondary | ICD-10-CM

## 2017-06-07 DIAGNOSIS — M76812 Anterior tibial syndrome, left leg: Secondary | ICD-10-CM | POA: Diagnosis not present

## 2017-06-07 NOTE — Telephone Encounter (Signed)
Left message informing pt she was experiencing a steroid flare, that she should rest, ice 3-4 times daily 15-20 minutes/session and take the OTC antiinflammatory she was able to tolerate.

## 2017-06-07 NOTE — Progress Notes (Signed)
Subjective:  Patient ID: Ashley Griffith, female    DOB: August 08, 1945,  MRN: [id number] HPI Chief Complaint  Patient presents with  . Foot Pain    Medial foot along 1st met left - sharp, stabbing pain x 2 months, no injury, no swelling, takes arthritits meds daily but not helping foot    72 y.o. female presents with the above complaint.     Past Medical History:  Diagnosis Date  . Arthritis   . Blood clot in vein    leg 11-23-1004  . Depression   . GERD (gastroesophageal reflux disease)   . Hemorrhoids    Patient notes intermittent rectal bleeding from these  . Hypothyroidism    Seen by Dr. Wilson Singer  . Obesity    Status post significant intentional weight loss in 2003 of > 90 pounds  . PONV (postoperative nausea and vomiting)   . Psoriatic arthritis ([place])    Follows with Dr. Naida Sleight  . Pulmonary embolism ([place]) march 1995  . Rosacea    Past Surgical History:  Procedure Laterality Date  . blood mass removed from abdomen  1972   ? questionable ectopic pregnancy  . childbirth  1976  . colonscopy     x 2  . KNEE ARTHROSCOPY Right 09/18/2012   Procedure: RIGHT ARTHROSCOPY KNEE, Tlateral meniscal debdridement and chondroplasty;  Surgeon: Gearlean Alf, MD;  Location: [facility];  Service: Orthopedics;  Laterality: Right;  . LUMBAR LAMINECTOMY/DECOMPRESSION MICRODISCECTOMY Right 09/16/2015   Procedure: Right Lumbar Four-FiveLaminectomy with resection of synovial cyst;  Surgeon: Erline Levine, MD;  Location: [facility];  Service: Neurosurgery;  Laterality: Right;  right  . nasal poly removed  age 17  . sclorotomy both legs veins  2005 or 2006  . SPHINCTEROTOMY  1990's  . TONSILLECTOMY  as child    Current Outpatient Medications:  .  Lifitegrast (XIIDRA) 5 % SOLN, Place 1 drop into both eyes 2 times daily., Disp: , Rfl:  .  acetaminophen (TYLENOL) 650 MG CR tablet, Take 650 mg by mouth 2 (two) times daily as needed for pain., Disp: , Rfl:  .  Adalimumab (HUMIRA) 40 MG/0.8ML  PSKT, Inject into the skin., Disp: , Rfl:  .  atorvastatin (LIPITOR) 10 MG tablet, Take 10 mg by mouth every morning. , Disp: , Rfl:  .  buPROPion (WELLBUTRIN XL) 150 MG 24 hr tablet, Take 150 mg by mouth daily before breakfast., Disp: , Rfl:  .  Calcium Carbonate Antacid (MAALOX) 600 MG chewable tablet, Chew 1,800-2,400 mg by mouth at bedtime. , Disp: , Rfl:  .  Calcium Citrate-Vitamin D (CALCIUM CITRATE + D PO), Take 1 tablet by mouth at bedtime. , Disp: , Rfl:  .  Carboxymethylcellulose Sodium (THERATEARS) 0.25 % SOLN, Take by mouth., Disp: , Rfl:  .  celecoxib (CELEBREX) 200 MG capsule, Take 200 mg by mouth 2 (two) times daily., Disp: , Rfl: 0 .  DHA-EPA-Flaxseed Oil-Vitamin E (THERA TEARS NUTRITION PO), Take 3 tablets by mouth daily., Disp: , Rfl:  .  fluticasone (FLONASE) 50 MCG/ACT nasal spray, Place 1 spray into the nose daily as needed for allergies., Disp: , Rfl:  .  folic acid (FOLVITE) 465 MCG tablet, Take 800 mcg by mouth every morning. , Disp: , Rfl:  .  hydrocortisone 2.5 % ointment, APPLY ON THE SKIN BID, Disp: , Rfl: 1 .  methotrexate (RHEUMATREX) 10 MG tablet, Take 10 mg by mouth once a week. Caution: Chemotherapy. Protect from light., Disp: , Rfl:  .  omeprazole (PRILOSEC) 40 MG capsule, Take 40 mg by mouth., Disp: , Rfl:  .  rivaroxaban (XARELTO) 20 MG TABS tablet, Take 20 mg by mouth daily with supper., Disp: , Rfl:  .  SYNTHROID 88 MCG tablet, Take 88 mcg by mouth daily., Disp: , Rfl: 4  Allergies  Allergen Reactions  . Erythromycin Hives and Nausea And Vomiting  . Monistat [Miconazole] Itching    Burning  . Secukinumab     Other reaction(s): Other (See Comments) unknown  . Thimerosal Itching    Red and blurry eyes  . Adhesive [Tape] Rash  . Penicillins Swelling and Rash    Has patient had a PCN reaction causing immediate rash, facial/tongue/throat swelling, SOB or lightheadedness with hypotension: Yes Has patient had a PCN reaction causing severe rash involving  mucus membranes or skin necrosis: No Has patient had a PCN reaction that required hospitalization No Has patient had a PCN reaction occurring within the last 10 years: No If all of the above answers are "NO", then may proceed with Cephalosporin use.    Review of Systems  HENT: Positive for hearing loss.   Musculoskeletal: Positive for arthralgias.  All other systems reviewed and are negative.  Objective:  There were no vitals filed for this visit.  General: Well developed, nourished, in no acute distress, alert and oriented x3   Dermatological: Skin is warm, dry and supple bilateral. Nails x 10 are well maintained; remaining integument appears unremarkable at this time. There are no open sores, no preulcerative lesions, no rash or signs of infection present.  Vascular: Dorsalis Pedis artery and Posterior Tibial artery pedal pulses are 2/4 bilateral with immedate capillary fill time. Pedal hair growth present. No varicosities and no lower extremity edema present bilateral.   Neruologic: Grossly intact via light touch bilateral. Vibratory intact via tuning fork bilateral. Protective threshold with Semmes Wienstein monofilament intact to all pedal sites bilateral. Patellar and Achilles deep tendon reflexes 2+ bilateral. No Babinski or clonus noted bilateral.   Musculoskeletal: No gross boney pedal deformities bilateral. No pain, crepitus, or limitation noted with foot and ankle range of motion bilateral. Muscular strength 5/5 in all groups tested bilateral.  Pain on palpation with dorsiflexion and even inversion of the foot.  She has pain on palpation of the tibialis anterior as it inserts on the base of the first metatarsal medial cuneiform joint area.  Posterior tibial tendon mildly tender at its insertion site on the navicular no fluctuance noted.  Gait: Unassisted, Nonantalgic.    Radiographs:  3 views taken today do not demonstrate any major osseous abnormalities nor acute findings.   There is some soft tissue swelling around the medial aspect of the left foot around the navicular tuberosity and the first metatarsal medial cuneiform joint area.  Assessment & Plan:   Assessment: Tibialis anterior tendinitis insertional in nature.  Plan: Injected the area today with 10 mg of Kenalog 5 mg Marcaine at the point of maximal tenderness after sterile Betadine skin prep.  Tolerated procedure well without complications.  Discussed appropriate shoe gear stretching exercises ice therapy as your modifications.     Max T. [place], [place]

## 2017-06-07 NOTE — Telephone Encounter (Signed)
Pt states she received a injection today from Dr. Milinda Pointer and it has now begun to hurt worse. Pt states she is out running errands, I could leave a message on her cell phone.

## 2017-06-08 NOTE — Telephone Encounter (Signed)
Pt presented to office stating she had a rash to her face, could the steroid injection cause. I told pt the steroid injection could cause a flushing, to treat with cool moist clothes and benadryl if needed. Pt requested the name of the steroid, and I reviewed and complete clinicals were not available. I told pt I would call with the steroid.

## 2017-06-09 NOTE — Telephone Encounter (Signed)
kenalog

## 2017-06-11 NOTE — Telephone Encounter (Signed)
I informed pt of Kenalog in the injection given by Dr. Milinda Pointer.

## 2017-06-19 DIAGNOSIS — M542 Cervicalgia: Secondary | ICD-10-CM | POA: Diagnosis not present

## 2017-06-19 DIAGNOSIS — R0609 Other forms of dyspnea: Secondary | ICD-10-CM | POA: Diagnosis not present

## 2017-06-19 DIAGNOSIS — E663 Overweight: Secondary | ICD-10-CM | POA: Diagnosis not present

## 2017-06-19 DIAGNOSIS — L408 Other psoriasis: Secondary | ICD-10-CM | POA: Diagnosis not present

## 2017-06-19 DIAGNOSIS — R21 Rash and other nonspecific skin eruption: Secondary | ICD-10-CM | POA: Diagnosis not present

## 2017-06-19 DIAGNOSIS — M1710 Unilateral primary osteoarthritis, unspecified knee: Secondary | ICD-10-CM | POA: Diagnosis not present

## 2017-06-19 DIAGNOSIS — L4050 Arthropathic psoriasis, unspecified: Secondary | ICD-10-CM | POA: Diagnosis not present

## 2017-06-19 DIAGNOSIS — Z79899 Other long term (current) drug therapy: Secondary | ICD-10-CM | POA: Diagnosis not present

## 2017-06-19 DIAGNOSIS — Z6828 Body mass index (BMI) 28.0-28.9, adult: Secondary | ICD-10-CM | POA: Diagnosis not present

## 2017-06-29 DIAGNOSIS — H547 Unspecified visual loss: Secondary | ICD-10-CM | POA: Diagnosis not present

## 2017-06-29 DIAGNOSIS — G8929 Other chronic pain: Secondary | ICD-10-CM | POA: Diagnosis not present

## 2017-06-29 DIAGNOSIS — M1990 Unspecified osteoarthritis, unspecified site: Secondary | ICD-10-CM | POA: Diagnosis not present

## 2017-06-29 DIAGNOSIS — R69 Illness, unspecified: Secondary | ICD-10-CM | POA: Diagnosis not present

## 2017-06-29 DIAGNOSIS — M069 Rheumatoid arthritis, unspecified: Secondary | ICD-10-CM | POA: Diagnosis not present

## 2017-06-29 DIAGNOSIS — E039 Hypothyroidism, unspecified: Secondary | ICD-10-CM | POA: Diagnosis not present

## 2017-06-29 DIAGNOSIS — H04129 Dry eye syndrome of unspecified lacrimal gland: Secondary | ICD-10-CM | POA: Diagnosis not present

## 2017-06-29 DIAGNOSIS — E785 Hyperlipidemia, unspecified: Secondary | ICD-10-CM | POA: Diagnosis not present

## 2017-06-29 DIAGNOSIS — K219 Gastro-esophageal reflux disease without esophagitis: Secondary | ICD-10-CM | POA: Diagnosis not present

## 2017-06-29 DIAGNOSIS — K5900 Constipation, unspecified: Secondary | ICD-10-CM | POA: Diagnosis not present

## 2017-07-30 DIAGNOSIS — E039 Hypothyroidism, unspecified: Secondary | ICD-10-CM | POA: Diagnosis not present

## 2017-09-18 DIAGNOSIS — R21 Rash and other nonspecific skin eruption: Secondary | ICD-10-CM | POA: Diagnosis not present

## 2017-09-18 DIAGNOSIS — L4050 Arthropathic psoriasis, unspecified: Secondary | ICD-10-CM | POA: Diagnosis not present

## 2017-09-18 DIAGNOSIS — L408 Other psoriasis: Secondary | ICD-10-CM | POA: Diagnosis not present

## 2017-09-18 DIAGNOSIS — Z79899 Other long term (current) drug therapy: Secondary | ICD-10-CM | POA: Diagnosis not present

## 2017-09-18 DIAGNOSIS — M1710 Unilateral primary osteoarthritis, unspecified knee: Secondary | ICD-10-CM | POA: Diagnosis not present

## 2017-09-18 DIAGNOSIS — M542 Cervicalgia: Secondary | ICD-10-CM | POA: Diagnosis not present

## 2017-09-18 DIAGNOSIS — E663 Overweight: Secondary | ICD-10-CM | POA: Diagnosis not present

## 2017-09-18 DIAGNOSIS — Z6826 Body mass index (BMI) 26.0-26.9, adult: Secondary | ICD-10-CM | POA: Diagnosis not present

## 2017-09-18 DIAGNOSIS — R0609 Other forms of dyspnea: Secondary | ICD-10-CM | POA: Diagnosis not present

## 2017-09-27 DIAGNOSIS — H04123 Dry eye syndrome of bilateral lacrimal glands: Secondary | ICD-10-CM | POA: Diagnosis not present

## 2017-09-27 DIAGNOSIS — H524 Presbyopia: Secondary | ICD-10-CM | POA: Diagnosis not present

## 2017-09-27 DIAGNOSIS — Z961 Presence of intraocular lens: Secondary | ICD-10-CM | POA: Diagnosis not present

## 2017-10-03 DIAGNOSIS — E039 Hypothyroidism, unspecified: Secondary | ICD-10-CM | POA: Diagnosis not present

## 2017-10-03 DIAGNOSIS — Z86711 Personal history of pulmonary embolism: Secondary | ICD-10-CM | POA: Diagnosis not present

## 2017-10-03 DIAGNOSIS — E7800 Pure hypercholesterolemia, unspecified: Secondary | ICD-10-CM | POA: Diagnosis not present

## 2017-10-03 DIAGNOSIS — R69 Illness, unspecified: Secondary | ICD-10-CM | POA: Diagnosis not present

## 2017-10-03 DIAGNOSIS — L4050 Arthropathic psoriasis, unspecified: Secondary | ICD-10-CM | POA: Diagnosis not present

## 2017-10-03 DIAGNOSIS — Z0000 Encounter for general adult medical examination without abnormal findings: Secondary | ICD-10-CM | POA: Diagnosis not present

## 2017-11-08 DIAGNOSIS — S61419A Laceration without foreign body of unspecified hand, initial encounter: Secondary | ICD-10-CM | POA: Diagnosis not present

## 2017-11-15 DIAGNOSIS — M8589 Other specified disorders of bone density and structure, multiple sites: Secondary | ICD-10-CM | POA: Diagnosis not present

## 2017-11-15 DIAGNOSIS — Z1231 Encounter for screening mammogram for malignant neoplasm of breast: Secondary | ICD-10-CM | POA: Diagnosis not present

## 2017-11-21 DIAGNOSIS — H903 Sensorineural hearing loss, bilateral: Secondary | ICD-10-CM | POA: Diagnosis not present

## 2017-11-27 DIAGNOSIS — M1711 Unilateral primary osteoarthritis, right knee: Secondary | ICD-10-CM | POA: Diagnosis not present

## 2017-11-30 DIAGNOSIS — M170 Bilateral primary osteoarthritis of knee: Secondary | ICD-10-CM | POA: Diagnosis not present

## 2017-12-07 DIAGNOSIS — M1712 Unilateral primary osteoarthritis, left knee: Secondary | ICD-10-CM | POA: Diagnosis not present

## 2017-12-07 DIAGNOSIS — M1711 Unilateral primary osteoarthritis, right knee: Secondary | ICD-10-CM | POA: Diagnosis not present

## 2017-12-12 DIAGNOSIS — M1711 Unilateral primary osteoarthritis, right knee: Secondary | ICD-10-CM | POA: Diagnosis not present

## 2017-12-12 DIAGNOSIS — M1712 Unilateral primary osteoarthritis, left knee: Secondary | ICD-10-CM | POA: Diagnosis not present

## 2017-12-17 DIAGNOSIS — R69 Illness, unspecified: Secondary | ICD-10-CM | POA: Diagnosis not present

## 2017-12-19 DIAGNOSIS — Z79899 Other long term (current) drug therapy: Secondary | ICD-10-CM | POA: Diagnosis not present

## 2017-12-19 DIAGNOSIS — M542 Cervicalgia: Secondary | ICD-10-CM | POA: Diagnosis not present

## 2017-12-19 DIAGNOSIS — L408 Other psoriasis: Secondary | ICD-10-CM | POA: Diagnosis not present

## 2017-12-19 DIAGNOSIS — L4050 Arthropathic psoriasis, unspecified: Secondary | ICD-10-CM | POA: Diagnosis not present

## 2017-12-19 DIAGNOSIS — E663 Overweight: Secondary | ICD-10-CM | POA: Diagnosis not present

## 2017-12-19 DIAGNOSIS — M1710 Unilateral primary osteoarthritis, unspecified knee: Secondary | ICD-10-CM | POA: Diagnosis not present

## 2017-12-19 DIAGNOSIS — Z6827 Body mass index (BMI) 27.0-27.9, adult: Secondary | ICD-10-CM | POA: Diagnosis not present

## 2017-12-20 DIAGNOSIS — M545 Low back pain: Secondary | ICD-10-CM | POA: Diagnosis not present

## 2017-12-20 DIAGNOSIS — M542 Cervicalgia: Secondary | ICD-10-CM | POA: Diagnosis not present

## 2018-01-02 DIAGNOSIS — M545 Low back pain: Secondary | ICD-10-CM | POA: Diagnosis not present

## 2018-01-02 DIAGNOSIS — M542 Cervicalgia: Secondary | ICD-10-CM | POA: Diagnosis not present

## 2018-01-07 DIAGNOSIS — M545 Low back pain: Secondary | ICD-10-CM | POA: Diagnosis not present

## 2018-01-09 DIAGNOSIS — M542 Cervicalgia: Secondary | ICD-10-CM | POA: Diagnosis not present

## 2018-01-09 DIAGNOSIS — M545 Low back pain: Secondary | ICD-10-CM | POA: Diagnosis not present

## 2018-01-14 DIAGNOSIS — M545 Low back pain: Secondary | ICD-10-CM | POA: Diagnosis not present

## 2018-01-14 DIAGNOSIS — M542 Cervicalgia: Secondary | ICD-10-CM | POA: Diagnosis not present

## 2018-01-16 DIAGNOSIS — M545 Low back pain: Secondary | ICD-10-CM | POA: Diagnosis not present

## 2018-01-21 DIAGNOSIS — M545 Low back pain: Secondary | ICD-10-CM | POA: Diagnosis not present

## 2018-01-21 DIAGNOSIS — M542 Cervicalgia: Secondary | ICD-10-CM | POA: Diagnosis not present

## 2018-01-23 DIAGNOSIS — M545 Low back pain: Secondary | ICD-10-CM | POA: Diagnosis not present

## 2018-01-23 DIAGNOSIS — M542 Cervicalgia: Secondary | ICD-10-CM | POA: Diagnosis not present

## 2018-01-28 DIAGNOSIS — M542 Cervicalgia: Secondary | ICD-10-CM | POA: Diagnosis not present

## 2018-01-28 DIAGNOSIS — M545 Low back pain: Secondary | ICD-10-CM | POA: Diagnosis not present

## 2018-01-29 DIAGNOSIS — L400 Psoriasis vulgaris: Secondary | ICD-10-CM | POA: Diagnosis not present

## 2018-01-29 DIAGNOSIS — L821 Other seborrheic keratosis: Secondary | ICD-10-CM | POA: Diagnosis not present

## 2018-01-29 DIAGNOSIS — L720 Epidermal cyst: Secondary | ICD-10-CM | POA: Diagnosis not present

## 2018-01-29 DIAGNOSIS — L218 Other seborrheic dermatitis: Secondary | ICD-10-CM | POA: Diagnosis not present

## 2018-01-30 DIAGNOSIS — M542 Cervicalgia: Secondary | ICD-10-CM | POA: Diagnosis not present

## 2018-01-30 DIAGNOSIS — M545 Low back pain: Secondary | ICD-10-CM | POA: Diagnosis not present

## 2018-01-30 DIAGNOSIS — L239 Allergic contact dermatitis, unspecified cause: Secondary | ICD-10-CM | POA: Diagnosis not present

## 2018-01-31 DIAGNOSIS — S61219A Laceration without foreign body of unspecified finger without damage to nail, initial encounter: Secondary | ICD-10-CM | POA: Diagnosis not present

## 2018-02-02 DIAGNOSIS — S61219A Laceration without foreign body of unspecified finger without damage to nail, initial encounter: Secondary | ICD-10-CM | POA: Diagnosis not present

## 2018-02-08 DIAGNOSIS — M419 Scoliosis, unspecified: Secondary | ICD-10-CM | POA: Diagnosis not present

## 2018-02-08 DIAGNOSIS — M545 Low back pain: Secondary | ICD-10-CM | POA: Diagnosis not present

## 2018-02-08 DIAGNOSIS — M4310 Spondylolisthesis, site unspecified: Secondary | ICD-10-CM | POA: Diagnosis not present

## 2018-02-08 DIAGNOSIS — M5136 Other intervertebral disc degeneration, lumbar region: Secondary | ICD-10-CM | POA: Diagnosis not present

## 2018-02-10 ENCOUNTER — Emergency Department (HOSPITAL_COMMUNITY)
Admission: EM | Admit: 2018-02-10 | Discharge: 2018-02-10 | Disposition: A | Discharge status: Home / Self Care | Payer: Medicare HMO | Attending: Emergency Medicine | Admitting: Emergency Medicine

## 2018-02-10 ENCOUNTER — Emergency Department (HOSPITAL_COMMUNITY): Payer: Medicare HMO

## 2018-02-10 ENCOUNTER — Encounter (HOSPITAL_COMMUNITY): Payer: Self-pay | Admitting: Emergency Medicine

## 2018-02-10 DIAGNOSIS — E039 Hypothyroidism, unspecified: Secondary | ICD-10-CM | POA: Diagnosis not present

## 2018-02-10 DIAGNOSIS — M545 Low back pain: Secondary | ICD-10-CM | POA: Diagnosis not present

## 2018-02-10 DIAGNOSIS — M549 Dorsalgia, unspecified: Secondary | ICD-10-CM | POA: Diagnosis present

## 2018-02-10 DIAGNOSIS — M48061 Spinal stenosis, lumbar region without neurogenic claudication: Secondary | ICD-10-CM | POA: Diagnosis not present

## 2018-02-10 DIAGNOSIS — M5417 Radiculopathy, lumbosacral region: Secondary | ICD-10-CM | POA: Diagnosis not present

## 2018-02-10 DIAGNOSIS — Z87891 Personal history of nicotine dependence: Secondary | ICD-10-CM | POA: Insufficient documentation

## 2018-02-10 DIAGNOSIS — Z79899 Other long term (current) drug therapy: Secondary | ICD-10-CM | POA: Insufficient documentation

## 2018-02-10 MED ORDER — OXYCODONE-ACETAMINOPHEN 5-325 MG PO TABS: 1.0000 | ORAL_TABLET | ORAL | 0 refills | Status: DC | PRN

## 2018-02-10 MED ORDER — ONDANSETRON HCL 4 MG/2ML IJ SOLN
4.0000 mg | Freq: Once | INTRAMUSCULAR | Status: AC
  Administered 2018-02-10: 4 mg via INTRAVENOUS
  Filled 2018-02-10: qty 2

## 2018-02-10 MED ORDER — DIAZEPAM 2 MG PO TABS
2.0000 mg | ORAL_TABLET | Freq: Once | ORAL | Status: AC
  Administered 2018-02-10: 2 mg via ORAL
  Filled 2018-02-10: qty 1

## 2018-02-10 MED ORDER — DEXAMETHASONE SODIUM PHOSPHATE 10 MG/ML IJ SOLN
10.0000 mg | Freq: Once | INTRAMUSCULAR | Status: AC
  Administered 2018-02-10: 10 mg via INTRAVENOUS
  Filled 2018-02-10: qty 1

## 2018-02-10 MED ORDER — MORPHINE SULFATE (PF) 4 MG/ML IV SOLN
4.0000 mg | Freq: Once | INTRAVENOUS | Status: AC
  Administered 2018-02-10: 4 mg via INTRAVENOUS
  Filled 2018-02-10: qty 1

## 2018-02-10 NOTE — ED Notes (Signed)
To MRI, pt removed her bra and clothes and earrings, went to [facility] and got in W/C with tech

## 2018-02-10 NOTE — ED Provider Notes (Signed)
[facility] EMERGENCY DEPARTMENT Provider Note   CSN: [id number] Arrival date & time: 02/10/18  1141     History   Chief Complaint Chief Complaint  Patient presents with  . Back Pain    HPI Ashley Griffith is a 72 y.o. female.  Patient with history of synovial cyst of the lumbar spine operated on in 2015 presents to the emergency department with acute onset of left-sided back pain with radiation down her left leg to her ankle.  Symptoms started approximately 4 days ago and have gradually worsened.  Patient describes the pain as a constant ache and not so much as a shooting pain.  She denies any numbness or tingling in her foot or ankle.  She denies any weakness in her leg and is been able to ambulate but with pain.  Patient is anticoagulated due to a history of previous DVT/PE.  She denies any recent trauma but thinks that her pain may have been brought on by recent physical therapy exercises.  She has been taking oxycodone with temporary improvement.  She was placed on a tapered course of steroids which does not seem to be helping.  She saw Dr. Tonita Cong for evaluation of her back.  With her uncontrolled symptoms, she was told by her doctor to come to the emergency department for pain control and consideration of an MRI.  The onset of this condition was acute. The course is constant. Aggravating factors: movement. Alleviating factors: none. Patient denies warning symptoms of back pain including: fecal incontinence, urinary retention or overflow incontinence, night sweats, waking from sleep with back pain, unexplained fevers or weight loss, h/o cancer, IVDU/spine injections, recent trauma.         Past Medical History:  Diagnosis Date  . Arthritis   . Blood clot in vein    leg [id number]  . Depression   . GERD (gastroesophageal reflux disease)   . Hemorrhoids    Patient notes intermittent rectal bleeding from these  . Hypothyroidism    Seen by Dr. Wilson Singer  .  Obesity    Status post significant intentional weight loss in 2003 of > 90 pounds  . PONV (postoperative nausea and vomiting)   . Psoriatic arthritis ([place])    Follows with Dr. Naida Sleight  . Pulmonary embolism ([place]) march 1995  . Rosacea     Patient Active Problem List   Diagnosis Date Noted  . VTE (venous thromboembolism) 09/05/2016  . Bilateral asymmetric sensorineural hearing loss 08/23/2016  . Subacute cough 06/08/2016  . DOE (dyspnea on exertion) 06/08/2016  . Synovial cyst of lumbar facet joint 09/16/2015    Past Surgical History:  Procedure Laterality Date  . blood mass removed from abdomen  1972   ? questionable ectopic pregnancy  . childbirth  1976  . colonscopy     x 2  . KNEE ARTHROSCOPY Right 09/18/2012   Procedure: RIGHT ARTHROSCOPY KNEE, Tlateral meniscal debdridement and chondroplasty;  Surgeon: Gearlean Alf, MD;  Location: [facility];  Service: Orthopedics;  Laterality: Right;  . LUMBAR LAMINECTOMY/DECOMPRESSION MICRODISCECTOMY Right 09/16/2015   Procedure: Right Lumbar Four-FiveLaminectomy with resection of synovial cyst;  Surgeon: Erline Levine, MD;  Location: [facility];  Service: Neurosurgery;  Laterality: Right;  right  . nasal poly removed  age 40  . sclorotomy both legs veins  2005 or 2006  . SPHINCTEROTOMY  1990's  . TONSILLECTOMY  as child     OB History   None      Home  Medications    Prior to Admission medications   Medication Sig Start Date End Date Taking? Authorizing Provider  acetaminophen (TYLENOL) 650 MG CR tablet Take 650 mg by mouth 2 (two) times daily as needed for pain.    [provider]  Adalimumab (HUMIRA) 40 MG/0.8ML PSKT Inject into the skin.    [provider]  atorvastatin (LIPITOR) 10 MG tablet Take 10 mg by mouth every morning.  07/14/13   [provider]  buPROPion (WELLBUTRIN XL) 150 MG 24 hr tablet Take 150 mg by mouth daily before breakfast.    [provider]  Calcium Carbonate Antacid  (MAALOX) 600 MG chewable tablet Chew 1,800-2,400 mg by mouth at bedtime.     [provider]  Calcium Citrate-Vitamin D (CALCIUM CITRATE + D PO) Take 1 tablet by mouth at bedtime.     [provider]  Carboxymethylcellulose Sodium (THERATEARS) 0.25 % SOLN Take by mouth.    [provider]  celecoxib (CELEBREX) 200 MG capsule Take 200 mg by mouth 2 (two) times daily. 08/30/15   [provider]  DHA-EPA-Flaxseed Oil-Vitamin E (THERA TEARS NUTRITION PO) Take 3 tablets by mouth daily.    [provider]  fluticasone (FLONASE) 50 MCG/ACT nasal spray Place 1 spray into the nose daily as needed for allergies.    [provider]  folic acid (FOLVITE) 245 MCG tablet Take 800 mcg by mouth every morning.     [provider]  hydrocortisone 2.5 % ointment APPLY ON THE SKIN BID 04/19/17   [provider]  Lifitegrast Shirley Friar) 5 % SOLN Place 1 drop into both eyes 2 times daily. 05/17/17   [provider]  methotrexate (RHEUMATREX) 10 MG tablet Take 10 mg by mouth once a week. Caution: Chemotherapy. Protect from light.    [provider]  omeprazole (PRILOSEC) 40 MG capsule Take 40 mg by mouth.    [provider]  rivaroxaban (XARELTO) 20 MG TABS tablet Take 20 mg by mouth daily with supper.    [provider]  SYNTHROID 88 MCG tablet Take 88 mcg by mouth daily. 07/08/15   [provider]    Family History Family History  Problem Relation Age of Onset  . Kidney failure Mother        At age 22  . Heart attack Father        At age 19  . Acute myelogenous leukemia Brother        At age 41  . Cancer Maternal Aunt        Breast cancer    Social History Social History   Tobacco Use  . Smoking status: Former Smoker    Packs/day: 1.00    Years: 5.00    Pack years: 5.00    Types: Cigarettes    Last attempt to quit: 04/24/1973    Years since quitting: 44.8  . Smokeless tobacco: Never Used    Substance Use Topics  . Alcohol use: Yes    Comment: social  . Drug use: No     Allergies   Erythromycin; Kenalog [triamcinolone acetonide]; Monistat [miconazole]; Secukinumab; Thimerosal; Adhesive [tape]; and Penicillins   Review of Systems Review of Systems  Constitutional: Negative for fever and unexpected weight change.  HENT: Negative for rhinorrhea and sore throat.   Eyes: Negative for redness.  Respiratory: Negative for cough.   Cardiovascular: Negative for chest pain.  Gastrointestinal: Negative for abdominal pain, constipation, diarrhea, nausea and vomiting.  Negative for fecal incontinence.   Genitourinary: Negative for dysuria, flank pain and hematuria.       Negative for urinary incontinence or retention.  Musculoskeletal: Positive for back pain. Negative for myalgias.  Skin: Negative for rash.  Neurological: Negative for weakness, numbness and headaches.       Denies saddle paresthesias.     Physical Exam Updated Vital Signs BP (!) (P) 157/84   Pulse (P) 67   Resp (P) 12   SpO2 (P) 97%   Physical Exam  Constitutional: She is oriented to person, place, and time. She appears well-developed and well-nourished. She appears distressed (Uncomfortable appearing).  HENT:  Head: Normocephalic and atraumatic.  Eyes: Conjunctivae are normal.  Neck: Normal range of motion. Neck supple.  Pulmonary/Chest: Effort normal.  Abdominal: Soft. There is no tenderness. There is no rebound, no guarding and no CVA tenderness.  No pulsatile mass.  Musculoskeletal: Normal range of motion.  No step-off noted with palpation of spine.   Neurological: She is alert and oriented to person, place, and time. She has normal strength and normal reflexes. No sensory deficit.  5/5 strength in entire lower extremities bilaterally. No sensation deficit.  Patient can get up and ambulate with assistance.  Antalgic gait.  No obvious foot drop.  Skin: Skin is warm and dry. No rash noted.   Psychiatric: She has a normal mood and affect.  Nursing note and vitals reviewed.    ED Treatments / Results  Labs (all labs ordered are listed, but only abnormal results are displayed) Labs Reviewed - No data to display  EKG None  Radiology Mr Lumbar Spine Wo Contrast  Result Date: 02/10/2018 CLINICAL DATA:  Low back pain extending into left hip and buttocks extending left leg and ankle. Progressive low back and left leg pain with failure conservative treatment. EXAM: MRI LUMBAR SPINE WITHOUT CONTRAST TECHNIQUE: Multiplanar, multisequence MR imaging of the lumbar spine was performed. No intravenous contrast was administered. COMPARISON:  MRI of lumbar spine 07/03/2015 FINDINGS: Segmentation: 5 non rib-bearing lumbar type vertebral bodies are present. The lowest fully formed vertebral body is L5. Alignment: Grade 1 anterolisthesis at L4-5 is new, measuring 4 mm in the midline. There is slight retrolisthesis at L3-4. AP alignment is otherwise anatomic. Levoconvex curvature is centered at L3. Vertebrae: Right posterolateral angioma present L1-2. Chronic endplate marrow changes are again seen at L5-S1. Marrow signal and vertebral body heights are otherwise normal. Conus medullaris and cauda equina: Conus extends to the L1 level. Conus and cauda equina appear normal. Paraspinal and other soft tissues: Limited imaging of the abdomen is unremarkable. There is no significant adenopathy. No solid organ lesions are present. Disc levels: L1-2: Far left lateral disc protrusion is slightly worse than on the prior study. Mild left foraminal narrowing is now present. L2-3: Mild broad-based disc bulge is present. There is no significant stenosis. L3-4: Mild disc bulging is present. There is mild facet hypertrophy bilaterally. Mild foraminal narrowing is noted bilaterally. L4-5: Right laminectomy is present. There is resection of previous synovial cyst. No residual right subarticular narrowing is present.  Progressive leftward disc protrusion and facet hypertrophy contributes to mild left subarticular narrowing. Grade 1 anterolisthesis is now present. There is progressive moderate left foraminal narrowing. Moderate right foraminal narrowing is stable. L5-S1: Disc bulging and facet hypertrophy are again seen. Central canal is patent. Mild foraminal narrowing is stable bilaterally. IMPRESSION: 1. Right laminectomy and resection of right-sided synovial cyst at L4-5 with decompression of the right subarticular space.  2. Progressive mild left subarticular narrowing and moderate left foraminal stenosis at L4-5. This is the most likely etiology of the patient's symptoms. 3. Moderate right foraminal narrowing is stable. 4. Mild foraminal narrowing bilaterally at L3-4. 5. Far left lateral disc protrusion at L1-2 with slight progression results in mild left foraminal stenosis. 6. Mild foraminal narrowing bilaterally at L5-S1 is stable. 7. Grade 1 anterolisthesis at L4-5 is new. Electronically Signed   By: San Morelle M.D.   On: 02/10/2018 15:20    Procedures Procedures (including critical care time)  Medications Ordered in ED Medications  morphine 4 MG/ML injection 4 mg (4 mg Intravenous Given 02/10/18 1301)  ondansetron (ZOFRAN) injection 4 mg (4 mg Intravenous Given 02/10/18 1300)  dexamethasone (DECADRON) injection 10 mg (10 mg Intravenous Given 02/10/18 1301)  diazepam (VALIUM) tablet 2 mg (2 mg Oral Given 02/10/18 1358)     Initial Impression / Assessment and Plan / ED Course  I have reviewed the triage vital signs and the nursing notes.  Pertinent labs & imaging results that were available during my care of the patient were reviewed by me and considered in my medical decision making (see chart for details).     Patient seen and examined.  IV placed.  Patient in severe pain and will require parenteral medications.  Patient has multiple red flags for back pain.  She is anticoagulated, she is  72 years old.  She had failed conservative therapy with anti-inflammatories including steroids, oral narcotic medications.  She has a history of previous spinal surgeries due to synovial cyst.  She had plain film imaging which is unrevealing 1 month ago.  She has a results of these which I reviewed.  Vital signs reviewed and are as follows: BP (!) (P) 157/84   Pulse (P) 67   Resp (P) 12   SpO2 (P) 97%   Discussed with Dr. Ralene Bathe.  Given the patient's uncontrolled pain with red flags and recent plain film imaging that is unrevealing, we will proceed with MRI to hopefully clarify the etiology of her pain.  Patient does not have any abdominal pain to make me suspect dissection or aneurysm.  4:08 PM MRI completed.  Patient updated on results.  Current plan is for continued control of pain with oxycodone-acetaminophen which patient has already been taking.  She has been using it responsibly and will be provided with #10 additional tablets.  She will continue the steroids ordered by her orthopedist.  She will follow-up with her orthopedist for continued management and treatment.  Discussed that will be helpful now that they have the MRI for further information regarding the etiology of her pain.  Encourage patient to return to the emergency department with uncontrolled pain, inability to walk, new symptoms or other concerns.  Patient and husband verbalized understanding and agree with plan.  Final Clinical Impressions(s) / ED Diagnoses   Final diagnoses:  Lumbosacral radiculopathy at L5   Patient with signs and symptoms consistent with lumbosacral radiculopathy.  Given her risk factors and uncontrolled symptoms, MRI was performed today.  This showed foraminal stenosis at L4/L5 likely the culprit of her current symptoms.  Patient does not have any central cord issues or any other emergent pathology which would require neurosurgical intervention today.  Her pain has been well controlled in the emergency  department with parenteral pain medications.  Patient will continue home pain medications and steroids.  She will call her orthopedic doctor tomorrow for further instructions.  Return instructions as above.  ED Discharge Orders         Ordered    oxyCODONE-acetaminophen (PERCOCET/ROXICET) 5-325 MG tablet  Every 4 hours PRN,   Status:  Discontinued     02/10/18 1606    oxyCODONE-acetaminophen (PERCOCET/ROXICET) 5-325 MG tablet  Every 4 hours PRN     02/10/18 1606           Carlisle Cater, PA-C 02/10/18 1611    Quintella Reichert, MD 02/11/18 [phone]

## 2018-02-10 NOTE — ED Notes (Signed)
Pt still in MRI 

## 2018-02-10 NOTE — ED Triage Notes (Signed)
Pt reports lower left back pain that radiates down her left leg since last Wednesday. Given prednisone and oxycodone from dr dean who she saw on Friday but states pain has not improved. Denies loss of bowel or bladder, no falls or trauma. States she called pcp who told her to come to the ED for possible MRI

## 2018-02-10 NOTE — ED Notes (Signed)
Pt stable, ambulatory, and verbalizes understanding of d/c instructions.  

## 2018-02-10 NOTE — ED Notes (Signed)
Pt feels better

## 2018-02-10 NOTE — Discharge Instructions (Signed)
Please read and follow all provided instructions.  Your diagnoses today include:  1. Lumbosacral radiculopathy at L5     Tests performed today include:  Vital signs - see below for your results today  MRI of your lower back -- shows several areas of narrowing including between lumbar vertebrae 4 and 5 on the left side which may be causing your symptoms.   Medications prescribed:   Percocet (oxycodone/acetaminophen) - narcotic pain medication  DO NOT drive or perform any activities that require you to be awake and alert because this medicine can make you drowsy. BE VERY CAREFUL not to take multiple medicines containing Tylenol (also called acetaminophen). Doing so can lead to an overdose which can damage your liver and cause liver failure and possibly death.  Take any prescribed medications only as directed.  Home care instructions:   Follow any educational materials contained in this packet  Please rest, use ice or heat on your back for the next several days  Do not lift, push, pull anything more than 10 pounds for the next week  Follow-up instructions: Please follow-up with Dr. Tonita Cong this week for further evaluation and treatment.   Return instructions:  SEEK IMMEDIATE MEDICAL ATTENTION IF YOU HAVE:  New numbness, tingling, weakness, or problem with the use of your arms or legs  Severe back pain not relieved with medications  Loss control of your bowels or bladder  Increasing pain in any areas of the body (such as chest or abdominal pain)  Shortness of breath, dizziness, or fainting.   Worsening nausea (feeling sick to your stomach), vomiting, fever, or sweats  Any other emergent concerns regarding your health   Additional Information:  Your vital signs today were: BP 128/73    Pulse 65    Resp 16    SpO2 100%  If your blood pressure (BP) was elevated above 135/85 this visit, please have this repeated by your doctor within one month. --------------

## 2018-02-11 DIAGNOSIS — M4310 Spondylolisthesis, site unspecified: Secondary | ICD-10-CM | POA: Diagnosis not present

## 2018-02-11 DIAGNOSIS — M545 Low back pain: Secondary | ICD-10-CM | POA: Diagnosis not present

## 2018-02-11 DIAGNOSIS — M419 Scoliosis, unspecified: Secondary | ICD-10-CM | POA: Diagnosis not present

## 2018-02-13 DIAGNOSIS — M961 Postlaminectomy syndrome, not elsewhere classified: Secondary | ICD-10-CM | POA: Diagnosis not present

## 2018-02-13 DIAGNOSIS — M4316 Spondylolisthesis, lumbar region: Secondary | ICD-10-CM | POA: Diagnosis not present

## 2018-02-13 DIAGNOSIS — M5416 Radiculopathy, lumbar region: Secondary | ICD-10-CM | POA: Diagnosis not present

## 2018-02-22 DIAGNOSIS — M5137 Other intervertebral disc degeneration, lumbosacral region: Secondary | ICD-10-CM | POA: Diagnosis not present

## 2018-02-22 DIAGNOSIS — M5416 Radiculopathy, lumbar region: Secondary | ICD-10-CM | POA: Diagnosis not present

## 2018-02-22 DIAGNOSIS — M5117 Intervertebral disc disorders with radiculopathy, lumbosacral region: Secondary | ICD-10-CM | POA: Diagnosis not present

## 2018-03-05 DIAGNOSIS — M5136 Other intervertebral disc degeneration, lumbar region: Secondary | ICD-10-CM | POA: Diagnosis not present

## 2018-03-05 DIAGNOSIS — M5416 Radiculopathy, lumbar region: Secondary | ICD-10-CM | POA: Diagnosis not present

## 2018-03-05 DIAGNOSIS — M21372 Foot drop, left foot: Secondary | ICD-10-CM | POA: Diagnosis not present

## 2018-03-05 DIAGNOSIS — M961 Postlaminectomy syndrome, not elsewhere classified: Secondary | ICD-10-CM | POA: Diagnosis not present

## 2018-03-19 DIAGNOSIS — Z79899 Other long term (current) drug therapy: Secondary | ICD-10-CM | POA: Diagnosis not present

## 2018-03-19 DIAGNOSIS — E663 Overweight: Secondary | ICD-10-CM | POA: Diagnosis not present

## 2018-03-19 DIAGNOSIS — L408 Other psoriasis: Secondary | ICD-10-CM | POA: Diagnosis not present

## 2018-03-19 DIAGNOSIS — L4050 Arthropathic psoriasis, unspecified: Secondary | ICD-10-CM | POA: Diagnosis not present

## 2018-03-19 DIAGNOSIS — R5383 Other fatigue: Secondary | ICD-10-CM | POA: Diagnosis not present

## 2018-03-19 DIAGNOSIS — Z6826 Body mass index (BMI) 26.0-26.9, adult: Secondary | ICD-10-CM | POA: Diagnosis not present

## 2018-03-19 DIAGNOSIS — M542 Cervicalgia: Secondary | ICD-10-CM | POA: Diagnosis not present

## 2018-03-19 DIAGNOSIS — E559 Vitamin D deficiency, unspecified: Secondary | ICD-10-CM | POA: Diagnosis not present

## 2018-03-19 DIAGNOSIS — M1710 Unilateral primary osteoarthritis, unspecified knee: Secondary | ICD-10-CM | POA: Diagnosis not present

## 2018-03-19 DIAGNOSIS — E538 Deficiency of other specified B group vitamins: Secondary | ICD-10-CM | POA: Diagnosis not present

## 2018-03-28 DIAGNOSIS — H6692 Otitis media, unspecified, left ear: Secondary | ICD-10-CM | POA: Diagnosis not present

## 2018-04-03 ENCOUNTER — Other Ambulatory Visit: Payer: Self-pay | Admitting: Neurosurgery

## 2018-04-03 DIAGNOSIS — M48061 Spinal stenosis, lumbar region without neurogenic claudication: Secondary | ICD-10-CM | POA: Diagnosis not present

## 2018-04-03 DIAGNOSIS — M5416 Radiculopathy, lumbar region: Secondary | ICD-10-CM | POA: Diagnosis not present

## 2018-04-03 DIAGNOSIS — M545 Low back pain: Secondary | ICD-10-CM | POA: Diagnosis not present

## 2018-04-03 DIAGNOSIS — M4316 Spondylolisthesis, lumbar region: Secondary | ICD-10-CM | POA: Diagnosis not present

## 2018-04-03 DIAGNOSIS — M21372 Foot drop, left foot: Secondary | ICD-10-CM | POA: Diagnosis not present

## 2018-04-03 DIAGNOSIS — E890 Postprocedural hypothyroidism: Secondary | ICD-10-CM | POA: Diagnosis not present

## 2018-04-03 DIAGNOSIS — E0500 Thyrotoxicosis with diffuse goiter without thyrotoxic crisis or storm: Secondary | ICD-10-CM | POA: Diagnosis not present

## 2018-04-09 DIAGNOSIS — J3489 Other specified disorders of nose and nasal sinuses: Secondary | ICD-10-CM | POA: Diagnosis not present

## 2018-04-09 DIAGNOSIS — R69 Illness, unspecified: Secondary | ICD-10-CM | POA: Diagnosis not present

## 2018-04-09 DIAGNOSIS — H9202 Otalgia, left ear: Secondary | ICD-10-CM | POA: Diagnosis not present

## 2018-04-09 DIAGNOSIS — Z86711 Personal history of pulmonary embolism: Secondary | ICD-10-CM | POA: Diagnosis not present

## 2018-04-09 DIAGNOSIS — L4050 Arthropathic psoriasis, unspecified: Secondary | ICD-10-CM | POA: Diagnosis not present

## 2018-04-09 DIAGNOSIS — E7800 Pure hypercholesterolemia, unspecified: Secondary | ICD-10-CM | POA: Diagnosis not present

## 2018-04-09 DIAGNOSIS — Z7901 Long term (current) use of anticoagulants: Secondary | ICD-10-CM | POA: Diagnosis not present

## 2018-04-09 DIAGNOSIS — Z23 Encounter for immunization: Secondary | ICD-10-CM | POA: Diagnosis not present

## 2018-04-15 DIAGNOSIS — Z86711 Personal history of pulmonary embolism: Secondary | ICD-10-CM | POA: Diagnosis not present

## 2018-04-15 DIAGNOSIS — R69 Illness, unspecified: Secondary | ICD-10-CM | POA: Diagnosis not present

## 2018-04-15 DIAGNOSIS — G8929 Other chronic pain: Secondary | ICD-10-CM | POA: Diagnosis not present

## 2018-04-15 DIAGNOSIS — Z7901 Long term (current) use of anticoagulants: Secondary | ICD-10-CM | POA: Diagnosis not present

## 2018-04-15 DIAGNOSIS — E039 Hypothyroidism, unspecified: Secondary | ICD-10-CM | POA: Diagnosis not present

## 2018-04-15 DIAGNOSIS — E7800 Pure hypercholesterolemia, unspecified: Secondary | ICD-10-CM | POA: Diagnosis not present

## 2018-04-15 DIAGNOSIS — Z01810 Encounter for preprocedural cardiovascular examination: Secondary | ICD-10-CM | POA: Diagnosis not present

## 2018-04-15 DIAGNOSIS — L4050 Arthropathic psoriasis, unspecified: Secondary | ICD-10-CM | POA: Diagnosis not present

## 2018-04-25 NOTE — Pre-Procedure Instructions (Addendum)
Ashley Griffith  04/25/2018      [id number] - , [place] [address] AT [address] [place] [address] Phone: [phone] Fax: [phone]  [id number], [place] [place] [address] Phone: [phone] Fax: [place], [place] [address] [address] [place] [address] [place] [address] Phone: [phone] Fax: [phone]    Your procedure is scheduled on 05/02/2018.  Report to [facility] Admitting at 1230 P.M.  Call this number if you have problems the morning of surgery:  [phone]   Remember:  Do not eat or drink after midnight.    Take these medicines the morning of surgery with A SIP OF WATER: Acetaminophen (Tylenol) - if needed Bupropion (Wellbutrin XL) Fluticasone (Flonase) nasal spray - if needed Gabapentin (Neurontin) Lifitegrast (Xiidra) eyedrop Omeprazole (Prilosec) Synthroid  7 days prior to surgery STOP taking any Celecoxib (Celebrex), Aspirin (unless otherwise instructed by your surgeon), Aleve, Naproxen, Ibuprofen, Motrin, Advil, Goody's, BC's, all herbal medications, fish oil, and all vitamins and supplements.  Follow your surgeon's instructions on when to stop Rivaroxaban (Xarelto).  If no instructions were given by your surgeon then you will need to call the office to get those instructions.        Do not wear jewelry, make-up or nail polish.  Do not wear lotions, powders, or perfumes, or deodorant.  Do not shave 48 hours prior to surgery.    Do not bring valuables to the hospital.  [facility] is not responsible for any belongings or valuables.  Contacts, eyeglasses, hearing aids, dentures or bridgework may not be worn into surgery.  Leave your suitcase in the car.  After surgery it may be brought to your room.  For patients admitted to  the hospital, discharge time will be determined by your treatment team.  Patients discharged the day of surgery will not be allowed to drive home.   Name and phone number of your driver:    Special instructions:   [facility]- Preparing For Surgery  Before surgery, you can play an important role. Because skin is not sterile, your skin needs to be as free of germs as possible. You can reduce the number of germs on your skin by washing with CHG (chlorahexidine gluconate) Soap before surgery.  CHG is an antiseptic cleaner which kills germs and bonds with the skin to continue killing germs even after washing.    Oral Hygiene is also important to reduce your risk of infection.  Remember - BRUSH YOUR TEETH THE MORNING OF SURGERY WITH YOUR REGULAR TOOTHPASTE  Please do not use if you have an allergy to CHG or antibacterial soaps. If your skin becomes reddened/irritated stop using the CHG.  Do not shave (including legs and underarms) for at least 48 hours prior to first CHG shower. It is OK to shave your face.  Please follow these instructions carefully.   1. Shower the NIGHT BEFORE SURGERY and the MORNING OF SURGERY with CHG.   2. If you chose to wash your hair, wash your hair first as usual with your normal shampoo.  3. After you shampoo, rinse your hair and body thoroughly to remove the shampoo.  4. Use CHG as you would any other liquid soap. You can apply CHG directly to the skin and wash gently with a scrungie or a clean washcloth.  5. Apply the CHG Soap to your body ONLY FROM THE NECK DOWN.  Do not use on open wounds or open sores. Avoid contact with your eyes, ears, mouth and genitals (private parts). Wash Face and genitals (private parts)  with your normal soap.  6. Wash thoroughly, paying special attention to the area where your surgery will be performed.  7. Thoroughly rinse your body with warm water from the neck down.  8. DO NOT shower/wash with your normal soap after using and  rinsing off the CHG Soap.  9. Pat yourself dry with a CLEAN TOWEL.  10. Wear CLEAN PAJAMAS to bed the night before surgery, wear comfortable clothes the morning of surgery  11. Place CLEAN SHEETS on your bed the night of your first shower and DO NOT SLEEP WITH PETS.    Day of Surgery: Shower as stated above Do not apply any deodorants/lotions.  Please wear clean clothes to the hospital/surgery center.   Remember to brush your teeth WITH YOUR REGULAR TOOTHPASTE.    Please read over the following fact sheets that you were given.

## 2018-04-26 ENCOUNTER — Encounter (HOSPITAL_COMMUNITY): Payer: Self-pay

## 2018-04-26 ENCOUNTER — Encounter (HOSPITAL_COMMUNITY)
Admission: RE | Admit: 2018-04-26 | Discharge: 2018-04-26 | Disposition: A | Discharge status: Home / Self Care | Payer: Medicare HMO | Source: Ambulatory Visit | Attending: Neurosurgery | Admitting: Neurosurgery

## 2018-04-26 ENCOUNTER — Other Ambulatory Visit: Payer: Self-pay

## 2018-04-26 DIAGNOSIS — Z01812 Encounter for preprocedural laboratory examination: Secondary | ICD-10-CM | POA: Diagnosis not present

## 2018-04-26 HISTORY — DX: Unspecified osteoarthritis, unspecified site: M19.90

## 2018-04-26 LAB — BASIC METABOLIC PANEL
Anion gap: 8 (ref 5–15)
BUN: 11 mg/dL (ref 8–23)
CO2: 26 mmol/L (ref 22–32)
Calcium: 9.5 mg/dL (ref 8.9–10.3)
Chloride: 104 mmol/L (ref 98–111)
Creatinine, Ser: 0.72 mg/dL (ref 0.44–1.00)
GFR calc non Af Amer: >60 mL/min (ref >60)
GLUCOSE: 93 mg/dL (ref 70–99)
Potassium: 3.5 mmol/L (ref 3.5–5.1)
Sodium: 138 mmol/L (ref 135–145)

## 2018-04-26 LAB — CBC
HCT: 44.6 % (ref 36.0–46.0)
Hemoglobin: 14.2 g/dL (ref 12.0–15.0)
MCH: 32.1 pg (ref 26.0–34.0)
MCHC: 31.8 g/dL (ref 30.0–36.0)
MCV: 100.9 fL — ABNORMAL HIGH (ref 80.0–100.0)
PLATELETS: 205 10*3/uL (ref 150–400)
RBC: 4.42 MIL/uL (ref 3.87–5.11)
RDW: 12.8 % (ref 11.5–15.5)
WBC: 6 10*3/uL (ref 4.0–10.5)
nRBC: 0 % (ref 0.0–0.2)

## 2018-04-26 LAB — SURGICAL PCR SCREEN
MRSA, PCR: NEGATIVE
Staphylococcus aureus: NEGATIVE

## 2018-04-26 LAB — TYPE AND SCREEN
ABO/RH(D): A POS
ANTIBODY SCREEN: NEGATIVE

## 2018-04-26 LAB — ABO/RH: ABO/RH(D): A POS

## 2018-04-26 NOTE — Progress Notes (Signed)
PCP - Milagros Evener at [place] [facility] - patient denies  Chest x-ray - n/a EKG - n/a Stress Test - patient denies ECHO - patient denies Cardiac Cath - patient denies  Sleep Study - patient denies CPAP -   Fasting Blood Sugar - n/a Checks Blood Sugar _____ times a day  Blood Thinner Instructions: patient states she was instructed to hold 2-3 days prior to surgery Aspirin Instructions:  Anesthesia review: n/a  Patient denies shortness of breath, fever, cough and chest pain at PAT appointment   Patient verbalized understanding of instructions that were given to them at the PAT appointment. Patient was also instructed that they will need to review over the PAT instructions again at home before surgery.

## 2018-04-30 NOTE — H&P (Signed)
Patient ID:   [id number] Patient: Ashley Griffith  Date of Birth: 03-19-1946 Visit Type: Office Visit   Date: 04/03/2018 09:00 AM Provider: Marchia Meiers. Vertell Limber MD   This 73 year old female presents for back pain.  HISTORY OF PRESENT ILLNESS: 1.  back pain  Last seen following right L4-5 laminectomy for synovial cyst, she returns reporting back pain over the past 2 months.  Ortho evaluation prompted nerve block by Dr. Nelva Bush, which offered some relief but pain began returning yesterday after lifting her small dog.  She also notes left foot slapping with ambulation and numbness in toes of both feet.  She reports left leg weakness "for a while".  Nerve block by Dr. Nelva Bush November 1st offered some pain relief  Gabapentin 1 tab per day  MRI and x-rays on [facility]  02/10/18 L-spine MRI without contrast 1. Right laminectomy and resection of right-sided synovial cyst at L4-5 with decompression of the right subarticular space. 2. Progressive mild left subarticular narrowing and moderate left foraminal stenosis at L4-5. This is most likely etiology of the patient's symptoms. 3. Moderate right foraminal narrowing is stable. 4. Mild foraminal narrowing bilaterally at L3-4. 5. Far left lateral disc protrusion at L1-2 with slight progression results in mild left foraminal stenosis. 6. Mild foraminal narrowing bilaterally at L5-S1 is stable. 7. Grade 1 anterolisthesis at L4-5 is new.       PAST MEDICAL/SURGICAL HISTORY:   (Reviewed, updated)   Disease/disorder Onset Date Management Date Comments     AGW 08/25/2015 - Anxiety     Depression     Psoriatic arthrits    AGW 08/25/2015 - Thyroid disease        Family History:  (Reviewed, updated) Relationship Family Member Name Deceased Age at Death Condition Onset Age Cause of Death Father    Gallstones  N Father    Heart disease  N Mother    Ulcerative  colitis  N Mother    Arthritis  N    Social History:  (Reviewed, updated) Tobacco use reviewed. Preferred language is Unknown.   Tobacco use status: Never smoked tobacco. Smoking status: Never smoker.  SMOKING STATUS Type Smoking Status Usage Per Day Years Used Total Pack Years  Never smoker     TOBACCO/VAPING EXPOSURE No passive vaping exposure. No passive smoke exposure.       MEDICATIONS: (added, continued or stopped this visit) Started Medication Directions Instruction Stopped  Antacid (calcium carbonate) 200 mg calcium (500 mg) chewable tablet Take 2 tablets daily    Antacid Extra Strength (calcium-mag hyd) 675 mg-135 mg chewable tablet     atorvastatin 10 mg tablet take 1 tablet by oral route  every day    biotin 1,000 mcg chewable tablet     calcium supplements  ORAL Take 1 tablet daily    Celebrex 100 mg capsule take 1 capsule by oral route  every day    Celebrex 200 mg capsule take 1 capsule by oral route  every day as needed  04/03/2018  Enbrel 50 mg/mL (0.98 mL) subcutaneous syringe inject 1 milliliter by subcutaneous route  every week  74/11/1446  folic acid 185 mcg tablet take 1 tablet by oral route  every day    gabapentin 300 mg capsule take 1 capsule by oral route 3 times every day    Humira 40 mg/0.8 mL subcutaneous syringe kit inject 1 milliliter by subcutaneous route  every week in the abdomen or thigh (rotate sites)    metronidazole 1 % topical  gel apply by topical route  every day to the affected area(s) ; rub in gently and completely    Omega 3 Fish Oil 684 mg-1,200 mg capsule,delayed release Take 1 tablet daily    omeprazole 20 mg tablet,delayed release take 1 capsule by oral route  every day    prednisone 5 mg tablet take 1 tablet by oral route  every day  04/03/2018  Synthroid 88 mcg tablet take 1 tablet by oral route  every day    Ticaspray 50 mcg-0.9 % kit, spray  suspension and spray 2 sprays in each nostril  04/03/2018  Trexall 10 mg tablet take 1 tablet by oral route  every week    Tylenol 325 mg tablet take 2 tablet by oral route  every 4 hours as needed    Vitamin D2 50,000 unit capsule take 1 capsule by oral route  every week    Wellbutrin XL 150 mg 24 hr tablet, extended release take 1 tablet by oral route  every day    Xarelto 20 mg tablet take 1 tablet by oral route  every day with the evening meal    Zoloft 50 mg tablet take 1 tablet by oral route  every day  04/03/2018    ALLERGIES: Ingredient Reaction Medication Name Comment ERYTHROMYCIN ESTOLATE    SECUKINUMAB  Cosentyx  PENICILLIN        PHYSICAL EXAM:  Vitals Date Temp F BP Pulse Ht In Wt Lb BMI BSA Pain Score 04/03/2018  153/80 76 65 158 26.29  3/10     IMPRESSION:  Upon examination, 4-/5 left dorsiflexion, 4-/5 left EHL, 4/5 left hip adductors, full strength RLE, left sciatic notch discomfort, decreased L5 pin sensation on the left.  L-spine MRI without contrast reveals bulging discs at every level within lumbar spine, left L4-5 foraminal narrowing with left L5 nerve impingement,   4-view L-spine X-rays reveal levo-convex lumbar scoliosis, nl hip joints, retrolisthesis L3 on L4, anterolisthesis of L4 on L5 - L4-5 anterolisthesis measurements: 5.3 mm on neutral, 5 mm on extension, 6 mm on flexion.   Requesting result of most recent bone density testing. Recommended fusion at L4-5 level as patient previously had a laminectomy on the right and a laminectomy on the left could further destabilize this level. Recommended L4-5 TLIF due to extent of weakness - discussed recovery . Advised patient that appointment with neurology is not necessary as her likely problem has been identified.  PLAN: 1. Requesting result of most recent bone densitometry 2. Nurse education given 3. LSO brace 4. L4-5 left TLIF scheduled after  05/02/18 5. Follow-up after surgery  Orders: Diagnostic Procedures: Assessment Procedure M54.16 Lumbar Spine- AP/Lat/Flex/Ex  Completed Orders (this encounter) Order Details Reason Side Interpretation Result Initial Treatment Date Region Lumbar Spine- AP/Lat/Flex/Ex      04/03/2018 All Levels to All Levels  Assessment/Plan  # Detail Type Description  1. Assessment Lumbar radiculopathy (M54.16).                   Provider:  Marchia Meiers. Vertell Limber MD  04/03/2018 10:16 AM Dictation edited by: Mirian Mo    CC Providers: Erline Levine MD  [address] [place], [place] [address]               Electronically signed by Marchia Meiers. Vertell Limber MD on 04/07/2018 02:54 PM

## 2018-05-02 ENCOUNTER — Inpatient Hospital Stay (HOSPITAL_COMMUNITY): Payer: Medicare HMO

## 2018-05-02 ENCOUNTER — Inpatient Hospital Stay (HOSPITAL_COMMUNITY): Payer: Medicare HMO | Admitting: Anesthesiology

## 2018-05-02 ENCOUNTER — Encounter (HOSPITAL_COMMUNITY)
Admission: RE | Disposition: A | Discharge status: Home / Self Care | Payer: Self-pay | Source: Home / Self Care | Attending: Neurosurgery

## 2018-05-02 ENCOUNTER — Other Ambulatory Visit: Payer: Self-pay

## 2018-05-02 ENCOUNTER — Encounter (HOSPITAL_COMMUNITY): Payer: Self-pay | Admitting: General Practice

## 2018-05-02 ENCOUNTER — Inpatient Hospital Stay (HOSPITAL_COMMUNITY)
Admission: RE | Admit: 2018-05-02 | Discharge: 2018-05-04 | DRG: 455 | Disposition: A | Discharge status: Home / Self Care | Payer: Medicare HMO | Attending: Neurosurgery | Admitting: Neurosurgery

## 2018-05-02 DIAGNOSIS — Z888 Allergy status to other drugs, medicaments and biological substances status: Secondary | ICD-10-CM

## 2018-05-02 DIAGNOSIS — Z91048 Other nonmedicinal substance allergy status: Secondary | ICD-10-CM

## 2018-05-02 DIAGNOSIS — M419 Scoliosis, unspecified: Secondary | ICD-10-CM | POA: Diagnosis present

## 2018-05-02 DIAGNOSIS — M4316 Spondylolisthesis, lumbar region: Secondary | ICD-10-CM | POA: Diagnosis present

## 2018-05-02 DIAGNOSIS — Z419 Encounter for procedure for purposes other than remedying health state, unspecified: Secondary | ICD-10-CM

## 2018-05-02 DIAGNOSIS — M545 Low back pain: Secondary | ICD-10-CM | POA: Diagnosis not present

## 2018-05-02 DIAGNOSIS — Z880 Allergy status to penicillin: Secondary | ICD-10-CM | POA: Diagnosis not present

## 2018-05-02 DIAGNOSIS — M48061 Spinal stenosis, lumbar region without neurogenic claudication: Secondary | ICD-10-CM | POA: Diagnosis present

## 2018-05-02 DIAGNOSIS — Z881 Allergy status to other antibiotic agents status: Secondary | ICD-10-CM

## 2018-05-02 DIAGNOSIS — M5116 Intervertebral disc disorders with radiculopathy, lumbar region: Secondary | ICD-10-CM | POA: Diagnosis not present

## 2018-05-02 DIAGNOSIS — Z981 Arthrodesis status: Secondary | ICD-10-CM | POA: Diagnosis not present

## 2018-05-02 HISTORY — PX: TRANSFORAMINAL LUMBAR INTERBODY FUSION (TLIF) WITH PEDICLE SCREW FIXATION 1 LEVEL: SHX6141

## 2018-05-02 LAB — PROTIME-INR
INR: 0.99
Prothrombin Time: 13 seconds (ref 11.4–15.2)

## 2018-05-02 SURGERY — TRANSFORAMINAL LUMBAR INTERBODY FUSION (TLIF) WITH PEDICLE SCREW FIXATION 1 LEVEL
Anesthesia: General | Site: Back | Laterality: Left

## 2018-05-02 MED ORDER — KCL IN DEXTROSE-NACL 20-5-0.45 MEQ/L-%-% IV SOLN
INTRAVENOUS | Status: DC
  Administered 2018-05-02: 20:00:00 via INTRAVENOUS
  Filled 2018-05-02: qty 1000

## 2018-05-02 MED ORDER — THROMBIN 20000 UNITS EX SOLR
CUTANEOUS | Status: DC | PRN
  Administered 2018-05-02: 13:00:00 via TOPICAL

## 2018-05-02 MED ORDER — BUPIVACAINE LIPOSOME 1.3 % IJ SUSP
20.0000 mL | Freq: Once | INTRAMUSCULAR | Status: DC
  Filled 2018-05-02: qty 20

## 2018-05-02 MED ORDER — ROCURONIUM BROMIDE 10 MG/ML (PF) SYRINGE
PREFILLED_SYRINGE | INTRAVENOUS | Status: DC | PRN
  Administered 2018-05-02: 40 mg via INTRAVENOUS
  Administered 2018-05-02: 10 mg via INTRAVENOUS

## 2018-05-02 MED ORDER — FENTANYL CITRATE (PF) 100 MCG/2ML IJ SOLN
INTRAMUSCULAR | Status: AC
  Filled 2018-05-02: qty 2

## 2018-05-02 MED ORDER — SUGAMMADEX SODIUM 200 MG/2ML IV SOLN
INTRAVENOUS | Status: DC | PRN
  Administered 2018-05-02: 139.8 mg via INTRAVENOUS

## 2018-05-02 MED ORDER — ACETAMINOPHEN ER 650 MG PO TBCR: 650.0000 mg | EXTENDED_RELEASE_TABLET | Freq: Three times a day (TID) | ORAL | Status: DC

## 2018-05-02 MED ORDER — MENTHOL 3 MG MT LOZG: 1.0000 | LOZENGE | OROMUCOSAL | Status: DC | PRN

## 2018-05-02 MED ORDER — BUPIVACAINE LIPOSOME 1.3 % IJ SUSP
INTRAMUSCULAR | Status: DC | PRN
  Administered 2018-05-02: 20 mL

## 2018-05-02 MED ORDER — GLYCOPYRROLATE PF 0.2 MG/ML IJ SOSY
PREFILLED_SYRINGE | INTRAMUSCULAR | Status: AC
  Filled 2018-05-02: qty 1

## 2018-05-02 MED ORDER — LIDOCAINE-EPINEPHRINE 1 %-1:100000 IJ SOLN
INTRAMUSCULAR | Status: DC | PRN
  Administered 2018-05-02: 10 mL

## 2018-05-02 MED ORDER — ATORVASTATIN CALCIUM 10 MG PO TABS
10.0000 mg | ORAL_TABLET | ORAL | Status: DC
  Administered 2018-05-03 – 2018-05-04 (×2): 10 mg via ORAL
  Filled 2018-05-02 (×2): qty 1

## 2018-05-02 MED ORDER — VANCOMYCIN HCL IN DEXTROSE 1-5 GM/200ML-% IV SOLN
INTRAVENOUS | Status: AC
  Administered 2018-05-02: 1000 mg via INTRAVENOUS
  Filled 2018-05-02: qty 200

## 2018-05-02 MED ORDER — LIFITEGRAST 5 % OP SOLN
1.0000 [drp] | Freq: Two times a day (BID) | OPHTHALMIC | Status: DC
  Administered 2018-05-02 – 2018-05-03 (×3): 1 [drp] via OPHTHALMIC

## 2018-05-02 MED ORDER — LIDOCAINE 2% (20 MG/ML) 5 ML SYRINGE
INTRAMUSCULAR | Status: DC | PRN
  Administered 2018-05-02: 40 mg via INTRAVENOUS
  Administered 2018-05-02: 60 mg via INTRAVENOUS

## 2018-05-02 MED ORDER — MIDAZOLAM HCL 2 MG/2ML IJ SOLN
INTRAMUSCULAR | Status: DC | PRN
  Administered 2018-05-02: 2 mg via INTRAVENOUS

## 2018-05-02 MED ORDER — LACTATED RINGERS IV SOLN
INTRAVENOUS | Status: DC
  Administered 2018-05-02 (×2): via INTRAVENOUS

## 2018-05-02 MED ORDER — ONDANSETRON HCL 4 MG/2ML IJ SOLN: 4.0000 mg | Freq: Once | INTRAMUSCULAR | Status: DC | PRN

## 2018-05-02 MED ORDER — METHOCARBAMOL 500 MG PO TABS
ORAL_TABLET | ORAL | Status: AC
  Filled 2018-05-02: qty 1

## 2018-05-02 MED ORDER — VANCOMYCIN HCL IN DEXTROSE 750-5 MG/150ML-% IV SOLN
750.0000 mg | Freq: Once | INTRAVENOUS | Status: AC
  Administered 2018-05-03: 750 mg via INTRAVENOUS
  Filled 2018-05-02: qty 150

## 2018-05-02 MED ORDER — CALCIUM CARBONATE-VITAMIN D 500-200 MG-UNIT PO TABS
2.0000 | ORAL_TABLET | Freq: Every day | ORAL | Status: DC
  Filled 2018-05-02 (×3): qty 2

## 2018-05-02 MED ORDER — PHENYLEPHRINE 40 MCG/ML (10ML) SYRINGE FOR IV PUSH (FOR BLOOD PRESSURE SUPPORT)
PREFILLED_SYRINGE | INTRAVENOUS | Status: AC
  Filled 2018-05-02: qty 10

## 2018-05-02 MED ORDER — OXYCODONE HCL 5 MG/5ML PO SOLN: 5.0000 mg | Freq: Once | ORAL | Status: AC | PRN

## 2018-05-02 MED ORDER — CALCIUM CARBONATE ANTACID 500 MG PO CHEW: 1500.0000 mg | CHEWABLE_TABLET | Freq: Every day | ORAL | Status: DC

## 2018-05-02 MED ORDER — ADALIMUMAB 40 MG/0.8ML ~~LOC~~ PSKT: 40.0000 mg | PREFILLED_SYRINGE | SUBCUTANEOUS | Status: DC

## 2018-05-02 MED ORDER — SODIUM CHLORIDE 0.9% FLUSH
3.0000 mL | Freq: Two times a day (BID) | INTRAVENOUS | Status: DC
  Administered 2018-05-02 – 2018-05-03 (×3): 3 mL via INTRAVENOUS

## 2018-05-02 MED ORDER — SODIUM CHLORIDE 0.9% FLUSH: 3.0000 mL | INTRAVENOUS | Status: DC | PRN

## 2018-05-02 MED ORDER — BISACODYL 10 MG RE SUPP: 10.0000 mg | Freq: Every day | RECTAL | Status: DC | PRN

## 2018-05-02 MED ORDER — ZOLPIDEM TARTRATE 5 MG PO TABS: 5.0000 mg | ORAL_TABLET | Freq: Every evening | ORAL | Status: DC | PRN

## 2018-05-02 MED ORDER — POLYETHYLENE GLYCOL 3350 17 G PO PACK: 17.0000 g | PACK | Freq: Every day | ORAL | Status: DC | PRN

## 2018-05-02 MED ORDER — BUPROPION HCL ER (XL) 150 MG PO TB24
300.0000 mg | ORAL_TABLET | Freq: Every day | ORAL | Status: DC
  Administered 2018-05-03 – 2018-05-04 (×2): 300 mg via ORAL
  Filled 2018-05-02 (×2): qty 2

## 2018-05-02 MED ORDER — PROPOFOL 10 MG/ML IV BOLUS
INTRAVENOUS | Status: AC
  Filled 2018-05-02: qty 20

## 2018-05-02 MED ORDER — PHENYLEPHRINE 40 MCG/ML (10ML) SYRINGE FOR IV PUSH (FOR BLOOD PRESSURE SUPPORT)
PREFILLED_SYRINGE | INTRAVENOUS | Status: DC | PRN
  Administered 2018-05-02 (×2): 120 ug via INTRAVENOUS

## 2018-05-02 MED ORDER — PROPOFOL 10 MG/ML IV BOLUS
INTRAVENOUS | Status: DC | PRN
  Administered 2018-05-02: 120 mg via INTRAVENOUS

## 2018-05-02 MED ORDER — OXYCODONE HCL 5 MG PO TABS
ORAL_TABLET | ORAL | Status: AC
  Filled 2018-05-02: qty 1

## 2018-05-02 MED ORDER — CHLORHEXIDINE GLUCONATE CLOTH 2 % EX PADS: 6.0000 | MEDICATED_PAD | Freq: Once | CUTANEOUS | Status: DC

## 2018-05-02 MED ORDER — PROMETHAZINE HCL 25 MG/ML IJ SOLN: 6.2500 mg | INTRAMUSCULAR | Status: DC | PRN

## 2018-05-02 MED ORDER — VANCOMYCIN HCL IN DEXTROSE 1-5 GM/200ML-% IV SOLN
INTRAVENOUS | Status: AC
  Filled 2018-05-02: qty 200

## 2018-05-02 MED ORDER — LEVOTHYROXINE SODIUM 88 MCG PO TABS
88.0000 ug | ORAL_TABLET | Freq: Every day | ORAL | Status: DC
  Administered 2018-05-03 – 2018-05-04 (×2): 88 ug via ORAL
  Filled 2018-05-02 (×2): qty 1

## 2018-05-02 MED ORDER — ACETAMINOPHEN 650 MG RE SUPP: 650.0000 mg | RECTAL | Status: DC | PRN

## 2018-05-02 MED ORDER — DOCUSATE SODIUM 100 MG PO CAPS
100.0000 mg | ORAL_CAPSULE | Freq: Two times a day (BID) | ORAL | Status: DC
  Filled 2018-05-02 (×3): qty 1

## 2018-05-02 MED ORDER — HYDROCODONE-ACETAMINOPHEN 5-325 MG PO TABS
2.0000 | ORAL_TABLET | ORAL | Status: DC | PRN
  Administered 2018-05-02 – 2018-05-04 (×7): 2 via ORAL
  Filled 2018-05-02 (×7): qty 2

## 2018-05-02 MED ORDER — MUPIROCIN 2 % EX OINT
1.0000 "application " | TOPICAL_OINTMENT | Freq: Two times a day (BID) | CUTANEOUS | Status: DC
  Administered 2018-05-03: 1 via NASAL
  Filled 2018-05-02: qty 22

## 2018-05-02 MED ORDER — PANTOPRAZOLE SODIUM 40 MG PO TBEC: 80.0000 mg | DELAYED_RELEASE_TABLET | Freq: Every day | ORAL | Status: DC

## 2018-05-02 MED ORDER — FLEET ENEMA 7-19 GM/118ML RE ENEM: 1.0000 | ENEMA | Freq: Once | RECTAL | Status: DC | PRN

## 2018-05-02 MED ORDER — DEXAMETHASONE SODIUM PHOSPHATE 10 MG/ML IJ SOLN
INTRAMUSCULAR | Status: DC | PRN
  Administered 2018-05-02: 10 mg via INTRAVENOUS

## 2018-05-02 MED ORDER — MORPHINE SULFATE (PF) 2 MG/ML IV SOLN: 2.0000 mg | INTRAVENOUS | Status: DC | PRN

## 2018-05-02 MED ORDER — ONDANSETRON HCL 4 MG/2ML IJ SOLN: 4.0000 mg | Freq: Four times a day (QID) | INTRAMUSCULAR | Status: DC | PRN

## 2018-05-02 MED ORDER — VITAMIN D (ERGOCALCIFEROL) 1.25 MG (50000 UNIT) PO CAPS
50000.0000 [IU] | ORAL_CAPSULE | ORAL | Status: DC
  Administered 2018-05-03: 50000 [IU] via ORAL
  Filled 2018-05-02: qty 1

## 2018-05-02 MED ORDER — ONDANSETRON HCL 4 MG/2ML IJ SOLN
INTRAMUSCULAR | Status: AC
  Filled 2018-05-02: qty 2

## 2018-05-02 MED ORDER — BIOTIN 1000 MCG PO TABS: 1000.0000 ug | ORAL_TABLET | Freq: Every day | ORAL | Status: DC

## 2018-05-02 MED ORDER — THROMBIN 20000 UNITS EX SOLR
CUTANEOUS | Status: AC
  Filled 2018-05-02: qty 20000

## 2018-05-02 MED ORDER — GABAPENTIN 300 MG PO CAPS
300.0000 mg | ORAL_CAPSULE | Freq: Every day | ORAL | Status: DC
  Administered 2018-05-02 – 2018-05-03 (×2): 300 mg via ORAL
  Filled 2018-05-02 (×2): qty 1

## 2018-05-02 MED ORDER — METHOCARBAMOL 500 MG PO TABS
500.0000 mg | ORAL_TABLET | Freq: Four times a day (QID) | ORAL | Status: DC | PRN
  Administered 2018-05-02 – 2018-05-04 (×4): 500 mg via ORAL
  Filled 2018-05-02 (×3): qty 1

## 2018-05-02 MED ORDER — GLYCOPYRROLATE 0.2 MG/ML IJ SOLN
INTRAMUSCULAR | Status: DC | PRN
  Administered 2018-05-02 (×2): 0.2 mg via INTRAVENOUS

## 2018-05-02 MED ORDER — ACETAMINOPHEN 325 MG PO TABS: 650.0000 mg | ORAL_TABLET | ORAL | Status: DC | PRN

## 2018-05-02 MED ORDER — 0.9 % SODIUM CHLORIDE (POUR BTL) OPTIME
TOPICAL | Status: DC | PRN
  Administered 2018-05-02: 1000 mL

## 2018-05-02 MED ORDER — FENTANYL CITRATE (PF) 250 MCG/5ML IJ SOLN
INTRAMUSCULAR | Status: DC | PRN
  Administered 2018-05-02: 100 ug via INTRAVENOUS
  Administered 2018-05-02: 50 ug via INTRAVENOUS

## 2018-05-02 MED ORDER — SODIUM CHLORIDE 0.9 % IV SOLN: 250.0000 mL | INTRAVENOUS | Status: DC

## 2018-05-02 MED ORDER — DEXAMETHASONE SODIUM PHOSPHATE 10 MG/ML IJ SOLN
INTRAMUSCULAR | Status: AC
  Filled 2018-05-02: qty 1

## 2018-05-02 MED ORDER — SODIUM CHLORIDE 0.9 % IV SOLN
INTRAVENOUS | Status: DC | PRN
  Administered 2018-05-02: 15 ug/min via INTRAVENOUS

## 2018-05-02 MED ORDER — FENTANYL CITRATE (PF) 100 MCG/2ML IJ SOLN: 25.0000 ug | INTRAMUSCULAR | Status: DC | PRN

## 2018-05-02 MED ORDER — PHENOL 1.4 % MT LIQD: 1.0000 | OROMUCOSAL | Status: DC | PRN

## 2018-05-02 MED ORDER — VANCOMYCIN HCL IN DEXTROSE 1-5 GM/200ML-% IV SOLN
1000.0000 mg | INTRAVENOUS | Status: AC
  Administered 2018-05-02: 1000 mg via INTRAVENOUS

## 2018-05-02 MED ORDER — FENTANYL CITRATE (PF) 250 MCG/5ML IJ SOLN
INTRAMUSCULAR | Status: AC
  Filled 2018-05-02: qty 5

## 2018-05-02 MED ORDER — FLUTICASONE PROPIONATE 50 MCG/ACT NA SUSP: 1.0000 | Freq: Every day | NASAL | Status: DC | PRN

## 2018-05-02 MED ORDER — PANTOPRAZOLE SODIUM 40 MG IV SOLR
40.0000 mg | Freq: Every day | INTRAVENOUS | Status: DC
  Administered 2018-05-02: 40 mg via INTRAVENOUS
  Filled 2018-05-02 (×2): qty 40

## 2018-05-02 MED ORDER — FOLIC ACID 1 MG PO TABS
1.0000 mg | ORAL_TABLET | ORAL | Status: DC
  Administered 2018-05-03 – 2018-05-04 (×2): 1 mg via ORAL
  Filled 2018-05-02 (×2): qty 1

## 2018-05-02 MED ORDER — BUPIVACAINE HCL (PF) 0.5 % IJ SOLN
INTRAMUSCULAR | Status: DC | PRN
  Administered 2018-05-02: 10 mL

## 2018-05-02 MED ORDER — THROMBIN 5000 UNITS EX SOLR
OROMUCOSAL | Status: DC | PRN
  Administered 2018-05-02: 13:00:00 via TOPICAL

## 2018-05-02 MED ORDER — FENTANYL CITRATE (PF) 100 MCG/2ML IJ SOLN
25.0000 ug | INTRAMUSCULAR | Status: DC | PRN
  Administered 2018-05-02: 50 ug via INTRAVENOUS
  Administered 2018-05-02: 25 ug via INTRAVENOUS
  Administered 2018-05-02: 50 ug via INTRAVENOUS
  Administered 2018-05-02: 25 ug via INTRAVENOUS

## 2018-05-02 MED ORDER — ALUM & MAG HYDROXIDE-SIMETH 200-200-20 MG/5ML PO SUSP: 30.0000 mL | Freq: Four times a day (QID) | ORAL | Status: DC | PRN

## 2018-05-02 MED ORDER — OXYCODONE HCL 5 MG PO TABS
5.0000 mg | ORAL_TABLET | Freq: Once | ORAL | Status: AC | PRN
  Administered 2018-05-02: 5 mg via ORAL

## 2018-05-02 MED ORDER — MIDAZOLAM HCL 2 MG/2ML IJ SOLN
INTRAMUSCULAR | Status: AC
  Filled 2018-05-02: qty 2

## 2018-05-02 MED ORDER — ONDANSETRON HCL 4 MG/2ML IJ SOLN
INTRAMUSCULAR | Status: DC | PRN
  Administered 2018-05-02: 4 mg via INTRAVENOUS

## 2018-05-02 MED ORDER — ONDANSETRON HCL 4 MG PO TABS: 4.0000 mg | ORAL_TABLET | Freq: Four times a day (QID) | ORAL | Status: DC | PRN

## 2018-05-02 MED ORDER — OXYCODONE HCL 5 MG PO TABS
5.0000 mg | ORAL_TABLET | ORAL | Status: DC | PRN
  Administered 2018-05-02 – 2018-05-03 (×2): 5 mg via ORAL
  Filled 2018-05-02 (×2): qty 1

## 2018-05-02 MED ORDER — METHOCARBAMOL 1000 MG/10ML IJ SOLN
500.0000 mg | Freq: Four times a day (QID) | INTRAVENOUS | Status: DC | PRN
  Filled 2018-05-02: qty 5

## 2018-05-02 SURGICAL SUPPLY — 83 items
ADH SKN CLS APL DERMABOND .7 (GAUZE/BANDAGES/DRESSINGS) ×1
BASKET BONE COLLECTION (BASKET) ×2 IMPLANT
BLADE CLIPPER SURG (BLADE) IMPLANT
BONE CANC CHIPS 20CC PCAN1/4 (Bone Implant) ×2 IMPLANT
BUR MATCHSTICK NEURO 3.0 LAGG (BURR) ×2 IMPLANT
BUR PRECISION FLUTE 5.0 (BURR) ×2 IMPLANT
CANISTER SUCT 3000ML PPV (MISCELLANEOUS) ×2 IMPLANT
CARTRIDGE OIL MAESTRO DRILL (MISCELLANEOUS) ×1 IMPLANT
CHIPS CANC BONE 20CC PCAN1/4 (Bone Implant) ×1 IMPLANT
CONT SPEC 4OZ CLIKSEAL STRL BL (MISCELLANEOUS) ×3 IMPLANT
COVER BACK TABLE 24X17X13 BIG (DRAPES) IMPLANT
COVER BACK TABLE 60X90IN (DRAPES) ×2 IMPLANT
COVER WAND RF STERILE (DRAPES) ×2 IMPLANT
DECANTER SPIKE VIAL GLASS SM (MISCELLANEOUS) ×2 IMPLANT
DERMABOND ADVANCED (GAUZE/BANDAGES/DRESSINGS) ×1
DERMABOND ADVANCED .7 DNX12 (GAUZE/BANDAGES/DRESSINGS) ×1 IMPLANT
DIFFUSER DRILL AIR PNEUMATIC (MISCELLANEOUS) ×2 IMPLANT
DRAPE C-ARM 42X72 X-RAY (DRAPES) ×2 IMPLANT
DRAPE C-ARMOR (DRAPES) ×2 IMPLANT
DRAPE LAPAROTOMY 100X72X124 (DRAPES) ×2 IMPLANT
DRAPE POUCH INSTRU U-SHP 10X18 (DRAPES) ×2 IMPLANT
DRAPE SURG 17X23 STRL (DRAPES) ×2 IMPLANT
DRSG OPSITE POSTOP 4X6 (GAUZE/BANDAGES/DRESSINGS) ×1 IMPLANT
DURAPREP 26ML APPLICATOR (WOUND CARE) ×2 IMPLANT
ELECT REM PT RETURN 9FT ADLT (ELECTROSURGICAL) ×2
ELECTRODE REM PT RTRN 9FT ADLT (ELECTROSURGICAL) ×1 IMPLANT
GAUZE 4X4 16PLY RFD (DISPOSABLE) IMPLANT
GAUZE SPONGE 4X4 12PLY STRL (GAUZE/BANDAGES/DRESSINGS) ×2 IMPLANT
GLOVE BIO SURGEON STRL SZ 6.5 (GLOVE) ×1 IMPLANT
GLOVE BIO SURGEON STRL SZ8 (GLOVE) ×4 IMPLANT
GLOVE BIOGEL PI IND STRL 8 (GLOVE) ×2 IMPLANT
GLOVE BIOGEL PI IND STRL 8.5 (GLOVE) ×2 IMPLANT
GLOVE BIOGEL PI INDICATOR 8 (GLOVE) ×2
GLOVE BIOGEL PI INDICATOR 8.5 (GLOVE) ×2
GLOVE ECLIPSE 8.0 STRL XLNG CF (GLOVE) ×4 IMPLANT
GLOVE EXAM NITRILE XL STR (GLOVE) IMPLANT
GLOVE INDICATOR 6.5 STRL GRN (GLOVE) ×1 IMPLANT
GLOVE SURG SS PI 7.0 STRL IVOR (GLOVE) ×4 IMPLANT
GLOVE SURG SS PI 7.5 STRL IVOR (GLOVE) ×2 IMPLANT
GOWN STRL REUS W/ TWL LRG LVL3 (GOWN DISPOSABLE) IMPLANT
GOWN STRL REUS W/ TWL XL LVL3 (GOWN DISPOSABLE) ×2 IMPLANT
GOWN STRL REUS W/TWL 2XL LVL3 (GOWN DISPOSABLE) ×4 IMPLANT
GOWN STRL REUS W/TWL LRG LVL3 (GOWN DISPOSABLE) ×10
GOWN STRL REUS W/TWL XL LVL3 (GOWN DISPOSABLE) ×6
GRAFT BNE CANC CHIPS 1-8 20CC (Bone Implant) IMPLANT
IMPL TLX 9X11X25MM 15DEG (Cage) IMPLANT
KIT BASIN OR (CUSTOM PROCEDURE TRAY) ×2 IMPLANT
KIT INFUSE XX SMALL 0.7CC (Orthopedic Implant) ×1 IMPLANT
KIT POSITION SURG JACKSON T1 (MISCELLANEOUS) ×2 IMPLANT
KIT TURNOVER KIT B (KITS) ×2 IMPLANT
NDL HYPO 21X1.5 SAFETY (NEEDLE) IMPLANT
NDL HYPO 25X1 1.5 SAFETY (NEEDLE) ×1 IMPLANT
NDL SPNL 18GX3.5 QUINCKE PK (NEEDLE) IMPLANT
NEEDLE HYPO 21X1.5 SAFETY (NEEDLE) ×2 IMPLANT
NEEDLE HYPO 25X1 1.5 SAFETY (NEEDLE) ×2 IMPLANT
NEEDLE SPNL 18GX3.5 QUINCKE PK (NEEDLE) IMPLANT
NS IRRIG 1000ML POUR BTL (IV SOLUTION) ×2 IMPLANT
OIL CARTRIDGE MAESTRO DRILL (MISCELLANEOUS) ×2
PACK LAMINECTOMY NEURO (CUSTOM PROCEDURE TRAY) ×2 IMPLANT
PAD ARMBOARD 7.5X6 YLW CONV (MISCELLANEOUS) ×6 IMPLANT
PATTIES SURGICAL .5 X.5 (GAUZE/BANDAGES/DRESSINGS) IMPLANT
PATTIES SURGICAL .5 X1 (DISPOSABLE) IMPLANT
PATTIES SURGICAL 1X1 (DISPOSABLE) IMPLANT
ROD RELINE LORDOTIC 5.5X45 (Rod) ×1 IMPLANT
ROD RELINE-O LORD 5.5X40 (Rod) ×1 IMPLANT
SCREW LOCK RELINE 5.5 TULIP (Screw) ×4 IMPLANT
SCREW RELINE-O POLY 6.5X40 (Screw) ×2 IMPLANT
SCREW RELINE-O POLY 6.5X45 (Screw) ×2 IMPLANT
SPONGE LAP 4X18 RFD (DISPOSABLE) IMPLANT
SPONGE SURGIFOAM ABS GEL 100 (HEMOSTASIS) ×2 IMPLANT
STAPLER SKIN PROX WIDE 3.9 (STAPLE) IMPLANT
SUT VIC AB 1 CT1 18XBRD ANBCTR (SUTURE) ×2 IMPLANT
SUT VIC AB 1 CT1 8-18 (SUTURE) ×2
SUT VIC AB 2-0 CT1 18 (SUTURE) ×3 IMPLANT
SUT VIC AB 3-0 SH 8-18 (SUTURE) ×3 IMPLANT
SYR 3ML LL SCALE MARK (SYRINGE) ×4 IMPLANT
SYR 5ML LL (SYRINGE) IMPLANT
SYRINGE 20CC LL (MISCELLANEOUS) ×1 IMPLANT
TLX IMPLANT 9X11X25MM 15DEG (Cage) ×2 IMPLANT
TOWEL GREEN STERILE (TOWEL DISPOSABLE) ×2 IMPLANT
TOWEL GREEN STERILE FF (TOWEL DISPOSABLE) ×2 IMPLANT
TRAY FOLEY MTR SLVR 16FR STAT (SET/KITS/TRAYS/PACK) ×2 IMPLANT
WATER STERILE IRR 1000ML POUR (IV SOLUTION) ×2 IMPLANT

## 2018-05-02 NOTE — Anesthesia Procedure Notes (Signed)
Procedure Name: Intubation Date/Time: 05/02/2018 1:42 PM Performed by: Valda Favia, CRNA Pre-anesthesia Checklist: Patient identified, Emergency Drugs available, Suction available and Patient being monitored Patient Re-evaluated:Patient Re-evaluated prior to induction Oxygen Delivery Method: Circle System Utilized Preoxygenation: Pre-oxygenation with 100% oxygen Induction Type: IV induction Ventilation: Mask ventilation without difficulty Laryngoscope Size: Mac and 4 Grade View: Grade II Tube type: Oral Tube size: 7.0 mm Number of attempts: 1 Airway Equipment and Method: Stylet and Oral airway Placement Confirmation: ETT inserted through vocal cords under direct vision,  positive ETCO2 and breath sounds checked- equal and bilateral Secured at: 22 cm Tube secured with: Tape Dental Injury: Teeth and Oropharynx as per pre-operative assessment

## 2018-05-02 NOTE — Brief Op Note (Signed)
05/02/2018  4:34 PM  PATIENT:  Ashley Griffith  72 y.o. female  PRE-OPERATIVE DIAGNOSIS:  Lumbar Foraminal Stenosis , herniated lumbar disc, spondylolisthesis, radiculopathy, lumbago L 45 level  POST-OPERATIVE DIAGNOSIS:   Lumbar Foraminal Stenosis , herniated lumbar disc, spondylolisthesis, radiculopathy, lumbago L 45 level      PROCEDURE:  Procedure(s) with comments: Left Lumbar four-five Transforaminal lumbar interbody fusion (Left) - Left Lumbar four-five Transforaminal lumbar interbody fusion with TLX titanium cage, autograft, pedicle screw fixation, posterolateral arthrodesis  SURGEON:  Surgeon(s) and Role:    * Harman Ferrin, MD - Primary    * Jenkins, Jeffrey, MD - Assisting  PHYSICIAN ASSISTANT:   ASSISTANTS: Poteat, RN  ANESTHESIA:   general  EBL:  150 mL   BLOOD ADMINISTERED:none  DRAINS: none   LOCAL MEDICATIONS USED:  MARCAINE    and LIDOCAINE   SPECIMEN:  No Specimen  DISPOSITION OF SPECIMEN:  N/A  COUNTS:  YES  TOURNIQUET:  * No tourniquets in log *  DICTATION: Patient is 72-year-old woman with  spondylolisthesis of L4 on L5 with lumbar stenosis. She has a severe left L5 radiculopathy.  She had previous laminectomy at L 45 on the right.   It was elected to take her to surgery for decompression and fusion at this level through left TLIF approach.   Procedure: Patient was placed in a prone position on the [place] table after smooth and uncomplicated induction of general endotracheal anesthesia. Her low back was prepped and draped in usual sterile fashion with DuraPrep. Area of incision was infiltrated with local lidocaine. Incision was made to the lumbodorsal fascia was incised and exposure was performed of the L4 through L5 spinous processes laminae facet joint and transverse processes. Intraoperative x-ray was obtained which confirmed correct orientation. A total hemi-laminectomy of L4 was performed on the left with disarticulation of the facet joints at  this level and thorough decompression was performed of both L4 and L5 nerve roots along with the common dural tube. This decompression was more involved than would be typical of that performed for TLIF alone and included painstaking dissection of adherent ligament compressing the thecal sac and wide decompression of all neural elements. A thorough discectomy was initially performed on the left with preparation of the endplates for grafting a trial spacer was placed this levell. Bone autograft was packed within the interspace  along with extra extra small BMP kit. 9 x 11 x 26 x 15 degree expandable Nuvasive TLX cage was packed with BMP and autograft  and was inserted the interspace and countersunk appropriately along with morselized bone autograft. Initial X ray showed the cage into L 4 vertebral body, so the cage was removed and repositioned with better positioning in the interspace.  The posterolateral region was extensively decorticated and pedicle probes were placed at L4 and L5 bilaterally. Intraoperative fluoroscopy confirmed correct orientationin the AP and lateral plane. 40 x 6.5 mm pedicle screws were placed at L5 bilaterally and 45 x 6.5 mm screws placed at L4 bilaterally final x-rays demonstrated well-positioned interbody grafts and pedicle screw fixation. A 40 mm lordotic rod was placed on the left and a 45 mm rod was placed on the right locked down in situ and the posterolateral region was packed with the remaining BMP and bone graft extender (20 cc allograft chips). The wound was irrigated.  Long-acting Marcaine was injected in the deep musculature.  Fascia was closed with 1 Vicryl sutures skin edges were reapproximated 2 and 3-0 Vicryl sutures. The   wound is dressed with Dermabond and an occlusive dressing.  The patient was extubated in the operating room and taken to recovery in stable satisfactory condition. She tolerated the operation well counts were correct at the end of the case.   PLAN OF  CARE: Admit to inpatient   PATIENT DISPOSITION:  PACU - hemodynamically stable.   Delay start of Pharmacological VTE agent (>24hrs) due to surgical blood loss or risk of bleeding: yes  

## 2018-05-02 NOTE — Progress Notes (Signed)
Patient up from PACU, transferred over to bed, IV pole found and cleaned scd machine found and cleaned. Skin assessment done honeycomb dressing with clear dressing on back, dressing is clean, dry and intact. Patient is alert and oriented. No distress noted noted patient is in pain. On my way to get pain meds.

## 2018-05-02 NOTE — Anesthesia Preprocedure Evaluation (Addendum)
Anesthesia Evaluation  Patient identified by MRN, date of birth, ID band Patient awake    Reviewed: Allergy & Precautions, NPO status , Patient's Chart, lab work & pertinent test results  Airway Mallampati: II  TM Distance: >3 FB Neck ROM: Full    Dental  (+) Dental Advisory Given   Pulmonary former smoker, PE   breath sounds clear to auscultation       Cardiovascular + DVT   Rhythm:Regular Rate:Normal     Neuro/Psych PSYCHIATRIC DISORDERS Depression negative neurological ROS     GI/Hepatic Neg liver ROS, GERD  ,  Endo/Other  Hypothyroidism     Renal/GU negative Renal ROS     Musculoskeletal  (+) Arthritis , Osteoarthritis,   Psoriatic arthritis    Abdominal   Peds  Hematology  (+) Blood dyscrasia (on xarelto), ,   Anesthesia Other Findings   Reproductive/Obstetrics                            Lab Results  Component Value Date   WBC 6.0 04/26/2018   HGB 14.2 04/26/2018   HCT 44.6 04/26/2018   MCV 100.9 (H) 04/26/2018   PLT 205 04/26/2018   Lab Results  Component Value Date   CREATININE 0.72 04/26/2018   BUN 11 04/26/2018   NA 138 04/26/2018   K 3.5 04/26/2018   CL 104 04/26/2018   CO2 26 04/26/2018    Anesthesia Physical Anesthesia Plan  ASA: II  Anesthesia Plan: General   Post-op Pain Management:    Induction: Intravenous  PONV Risk Score and Plan: 4 or greater and Treatment may vary due to age or medical condition, Ondansetron and Dexamethasone  Airway Management Planned: Oral ETT  Additional Equipment: None  Intra-op Plan:   Post-operative Plan: Extubation in OR  Informed Consent: I have reviewed the patients History and Physical, chart, labs and discussed the procedure including the risks, benefits and alternatives for the proposed anesthesia with the patient or authorized representative who has indicated his/her understanding and acceptance.   Dental  advisory given  Plan Discussed with: CRNA and Anesthesiologist  Anesthesia Plan Comments:        Anesthesia Quick Evaluation

## 2018-05-02 NOTE — Progress Notes (Signed)
Awake, alert, conversant.  Full bilateral lower extremity strength.  Leg pain better.  Sore in back.  Doing well.

## 2018-05-02 NOTE — Progress Notes (Addendum)
Pharmacy Antibiotic Note  Ashley Griffith is a 73 y.o. female admitted on 05/02/2018 with .  S/p transformainal lumbar interbody fusion surgery today 05/02/18. Pharmacy has been consulted for Vancomycin dosing for 1 dose 12 hours post-op unless patient has a drain, then continue vancomycin until discontinued by physician; for surgical prophylaxis.  DRAINS: none   Vancomycin 1000 mg IV x1 given preop 1/9 @ 13:05  Plan: Vancomycin 750 mg IV x1 at 01:00 05/03/18.  Pharmacy will sign off. Please re-consult pharmacy if vancomycin needs to continue.  Height: 5\' 5"  (165.1 cm) Weight: 154 lb (69.9 kg) IBW/kg (Calculated) : 57  Temp (24hrs), Avg:97.8 F (36.6 C), Min:97.4 F (36.3 C), Max:98.2 F (36.8 C)  Recent Labs  Lab 04/26/18 1137  WBC 6.0  CREATININE 0.72    Estimated Creatinine Clearance: 62.4 mL/min (by C-G formula based on SCr of 0.72 mg/dL).    Allergies  Allergen Reactions  . Erythromycin Hives and Nausea And Vomiting  . Penicillins Swelling, Rash and Other (See Comments)    PATIENT HAS HAD A PCN REACTION WITH IMMEDIATE RASH, FACIAL/TONGUE/THROAT SWELLING, SOB, OR LIGHTHEADEDNESS WITH HYPOTENSION:  #  #  YES  #  #  Has patient had a PCN reaction causing severe rash involving mucus membranes or skin necrosis: No Has patient had a PCN reaction that required hospitalization No Has patient had a PCN reaction occurring within the last 10 years: No If all of the above answers are "NO", then may proceed with Cephalosporin use.   . Adhesive [Tape] Rash  . Kenalog [Triamcinolone Acetonide] Other (See Comments)    Facial flush  . Monistat [Miconazole] Itching and Other (See Comments)    Burning  . Secukinumab Itching  . Thimerosal Itching and Other (See Comments)    Red and blurry eyes    Thank you for allowing pharmacy to be a part of this patient's care. Nicole Cella, RPh Clinical Pharmacist Please check AMION for all [place] phone numbers After 10:00 PM, call [place] [phone] 05/02/2018 7:29 PM

## 2018-05-02 NOTE — Op Note (Signed)
05/02/2018  4:34 PM  PATIENT:  Ashley Griffith  73 y.o. female  PRE-OPERATIVE DIAGNOSIS:  Lumbar Foraminal Stenosis , herniated lumbar disc, spondylolisthesis, radiculopathy, lumbago L 45 level  POST-OPERATIVE DIAGNOSIS:   Lumbar Foraminal Stenosis , herniated lumbar disc, spondylolisthesis, radiculopathy, lumbago L 45 level      PROCEDURE:  Procedure(s) with comments: Left Lumbar four-five Transforaminal lumbar interbody fusion (Left) - Left Lumbar four-five Transforaminal lumbar interbody fusion with TLX titanium cage, autograft, pedicle screw fixation, posterolateral arthrodesis  SURGEON:  Surgeon(s) and Role:    Erline Levine, MD - Primary    * Newman Pies, MD - Assisting  PHYSICIAN ASSISTANT:   ASSISTANTS: Poteat, RN  ANESTHESIA:   general  EBL:  150 mL   BLOOD ADMINISTERED:none  DRAINS: none   LOCAL MEDICATIONS USED:  MARCAINE    and LIDOCAINE   SPECIMEN:  No Specimen  DISPOSITION OF SPECIMEN:  N/A  COUNTS:  YES  TOURNIQUET:  * No tourniquets in log *  DICTATION: Patient is 73 year old woman with  spondylolisthesis of L4 on L5 with lumbar stenosis. She has a severe left L5 radiculopathy.  She had previous laminectomy at L 45 on the right.   It was elected to take her to surgery for decompression and fusion at this level through left TLIF approach.   Procedure: Patient was placed in a prone position on the [place] table after smooth and uncomplicated induction of general endotracheal anesthesia. Her low back was prepped and draped in usual sterile fashion with DuraPrep. Area of incision was infiltrated with local lidocaine. Incision was made to the lumbodorsal fascia was incised and exposure was performed of the L4 through L5 spinous processes laminae facet joint and transverse processes. Intraoperative x-ray was obtained which confirmed correct orientation. A total hemi-laminectomy of L4 was performed on the left with disarticulation of the facet joints at  this level and thorough decompression was performed of both L4 and L5 nerve roots along with the common dural tube. This decompression was more involved than would be typical of that performed for TLIF alone and included painstaking dissection of adherent ligament compressing the thecal sac and wide decompression of all neural elements. A thorough discectomy was initially performed on the left with preparation of the endplates for grafting a trial spacer was placed this levell. Bone autograft was packed within the interspace  along with extra extra small BMP kit. 9 x 11 x 26 x 15 degree expandable Nuvasive TLX cage was packed with BMP and autograft  and was inserted the interspace and countersunk appropriately along with morselized bone autograft. Initial X ray showed the cage into L 4 vertebral body, so the cage was removed and repositioned with better positioning in the interspace.  The posterolateral region was extensively decorticated and pedicle probes were placed at L4 and L5 bilaterally. Intraoperative fluoroscopy confirmed correct orientationin the AP and lateral plane. 40 x 6.5 mm pedicle screws were placed at L5 bilaterally and 45 x 6.5 mm screws placed at L4 bilaterally final x-rays demonstrated well-positioned interbody grafts and pedicle screw fixation. A 40 mm lordotic rod was placed on the left and a 45 mm rod was placed on the right locked down in situ and the posterolateral region was packed with the remaining BMP and bone graft extender (20 cc allograft chips). The wound was irrigated.  Long-acting Marcaine was injected in the deep musculature.  Fascia was closed with 1 Vicryl sutures skin edges were reapproximated 2 and 3-0 Vicryl sutures. The  wound is dressed with Dermabond and an occlusive dressing.  The patient was extubated in the operating room and taken to recovery in stable satisfactory condition. She tolerated the operation well counts were correct at the end of the case.   PLAN OF  CARE: Admit to inpatient   PATIENT DISPOSITION:  PACU - hemodynamically stable.   Delay start of Pharmacological VTE agent (>24hrs) due to surgical blood loss or risk of bleeding: yes

## 2018-05-02 NOTE — Interval H&P Note (Signed)
History and Physical Interval Note:  05/02/2018 1:02 PM  Ashley Griffith  has presented today for surgery, with the diagnosis of Lumbar foraminal stenosis  The various methods of treatment have been discussed with the patient and family. After consideration of risks, benefits and other options for treatment, the patient has consented to  Procedure(s) with comments: Left Lumbar 4-5 Transforaminal lumbar interbody fusion (Left) - Left Lumbar 4-5 Transforaminal lumbar interbody fusion as a surgical intervention .  The patient's history has been reviewed, patient examined, no change in status, stable for surgery.  I have reviewed the patient's chart and labs.  Questions were answered to the patient's satisfaction.     Peggyann Shoals

## 2018-05-02 NOTE — Transfer of Care (Signed)
Immediate Anesthesia Transfer of Care Note  Patient: Ashley Griffith  Procedure(s) Performed: Left Lumbar four-five Transforaminal lumbar interbody fusion (Left Back)  Patient Location: PACU  Anesthesia Type:General  Level of Consciousness: drowsy  Airway & Oxygen Therapy: Patient Spontanous Breathing and Patient connected to nasal cannula oxygen  Post-op Assessment: Report given to RN and Post -op Vital signs reviewed and stable  Post vital signs: Reviewed and stable  Last Vitals:  Vitals Value Taken Time  BP 120/79 05/02/2018  4:45 PM  Temp 36.5 C 05/02/2018  4:45 PM  Pulse 66 05/02/2018  4:48 PM  Resp 16 05/02/2018  4:48 PM  SpO2 96 % 05/02/2018  4:48 PM  Vitals shown include unvalidated device data.  Last Pain:  Vitals:   05/02/18 1645  TempSrc:   PainSc: 7       Patients Stated Pain Goal: 0 (45/40/98 1191)  Complications: No apparent anesthesia complications

## 2018-05-03 ENCOUNTER — Encounter (HOSPITAL_COMMUNITY): Payer: Self-pay | Admitting: Neurosurgery

## 2018-05-03 MED ORDER — CALCIUM CARBONATE-VITAMIN D 500-200 MG-UNIT PO TABS
2.0000 | ORAL_TABLET | Freq: Every day | ORAL | Status: DC
  Administered 2018-05-03: 2 via ORAL
  Filled 2018-05-03: qty 2

## 2018-05-03 MED ORDER — CALCIUM CARBONATE ANTACID 500 MG PO CHEW: 3.0000 | CHEWABLE_TABLET | Freq: Every day | ORAL | Status: DC

## 2018-05-03 NOTE — Evaluation (Addendum)
Physical Therapy Evaluation Patient Details Name: Ashley Griffith MRN: [id number] DOB: 10-30-1945 Today's Date: 05/03/2018   History of Present Illness  73 y.o. female admitted for Left Lumbar four-five Transforaminal lumbar interbody fusion   Clinical Impression  Patient is s/p above surgery resulting in the deficits listed below (see PT Problem List). Pt ambulated 200' with RW, no loss of balance. Pt has minor weakness with L ankle dorsiflexion and plantar flexion, instructed pt in strengthening exercises. Instructed pt in back precautions. Good progress expected.  Patient will benefit from skilled PT to increase their independence and safety with mobility (while adhering to their precautions) to allow discharge to the venue listed below.     Follow Up Recommendations Follow surgeon's recommendation for DC plan and follow-up therapies    Equipment Recommendations  Rolling walker with 5" wheels;3in1 (PT)    Recommendations for Other Services       Precautions / Restrictions Precautions Precautions: Back Precaution Booklet Issued: Yes (comment) Precaution Comments: instructed pt in log roll and back precautions Required Braces or Orthoses: Spinal Brace Restrictions Weight Bearing Restrictions: No      Mobility  Bed Mobility Overal bed mobility: Needs Assistance Bed Mobility: Rolling;Sidelying to Sit Rolling: Supervision Sidelying to sit: Supervision       General bed mobility comments: VCs for log roll technique  Transfers Overall transfer level: Needs assistance Equipment used: Rolling walker (2 wheeled) Transfers: Sit to/from Stand Sit to Stand: Supervision         General transfer comment: VCs hand placement  Ambulation/Gait Ambulation/Gait assistance: Min guard Gait Distance (Feet): 200 Feet Assistive device: Rolling walker (2 wheeled) Gait Pattern/deviations: Step-through pattern     General Gait Details: decreased heel strike on L, no foot drop on  L, distance limited by pain/fatigue  Stairs            Wheelchair Mobility    Modified Rankin (Stroke Patients Only)       Balance Overall balance assessment: Modified Independent                                           Pertinent Vitals/Pain Pain Assessment: 0-10 Pain Score: 5  Pain Location: back and LLE Pain Descriptors / Indicators: Sore Pain Intervention(s): Limited activity within patient's tolerance;Monitored during session;Premedicated before session    Home Living   Living Arrangements: Spouse/significant other   Pt has handicapped height commode, grab bars in shower, built in shower seat, reacher.                     Prior Function Level of Independence: Independent         Comments: had L foot drop for ~ 1 month prior to admission     Hand Dominance   Dominant Hand: Left    Extremity/Trunk Assessment   Upper Extremity Assessment Upper Extremity Assessment: Defer to OT evaluation    Lower Extremity Assessment Lower Extremity Assessment: LLE deficits/detail LLE Deficits / Details: ankle DF AROM ~15*, ankle PF/DF 4/5 LLE Sensation: WNL    Cervical / Trunk Assessment Cervical / Trunk Assessment: Normal  Communication   Communication: No difficulties  Cognition Arousal/Alertness: Awake/alert Behavior During Therapy: WFL for tasks assessed/performed Overall Cognitive Status: Within Functional Limits for tasks assessed  General Comments      Exercises General Exercises - Lower Extremity Ankle Circles/Pumps: AROM;Both;15 reps;Supine   Assessment/Plan    PT Assessment Patient needs continued PT services  PT Problem List Decreased mobility;Decreased activity tolerance;Decreased range of motion;Pain       PT Treatment Interventions Gait training;Stair training;Therapeutic exercise;Therapeutic activities;Patient/family education;Functional mobility  training;DME instruction    PT Goals (Current goals can be found in the Care Plan section)  Acute Rehab PT Goals Patient Stated Goal: return to gym PT Goal Formulation: With patient Time For Goal Achievement: 05/10/18 Potential to Achieve Goals: Good    Frequency Min 6X/week   Barriers to discharge        Co-evaluation               AM-PAC PT "6 Clicks" Mobility  Outcome Measure Help needed turning from your back to your side while in a flat bed without using bedrails?: A Little Help needed moving from lying on your back to sitting on the side of a flat bed without using bedrails?: A Little Help needed moving to and from a bed to a chair (including a wheelchair)?: A Little Help needed standing up from a chair using your arms (e.g., wheelchair or bedside chair)?: A Little Help needed to walk in hospital room?: A Little Help needed climbing 3-5 steps with a railing? : A Little 6 Click Score: 18    End of Session Equipment Utilized During Treatment: Gait belt;Back brace Activity Tolerance: Patient tolerated treatment well Patient left: in bed;with call bell/phone within reach;with nursing/sitter in room Nurse Communication: Mobility status PT Visit Diagnosis: Pain;Difficulty in walking, not elsewhere classified (R26.2);Muscle weakness (generalized) (M62.81) Pain - Right/Left: Left Pain - part of body: Leg    Time: 6962-9528 PT Time Calculation (min) (ACUTE ONLY): 41 min   Charges:   PT Evaluation $PT Eval Low Complexity: 1 Low PT Treatments $Gait Training: 23-37 mins        Blondell Reveal Kistler PT 05/03/2018  Acute Rehabilitation Services Pager [phone] Office [phone]

## 2018-05-03 NOTE — Anesthesia Postprocedure Evaluation (Signed)
Anesthesia Post Note  Patient: Ashley Griffith  Procedure(s) Performed: Left Lumbar four-five Transforaminal lumbar interbody fusion (Left Back)     Patient location during evaluation: PACU Anesthesia Type: General Level of consciousness: awake and alert Pain management: pain level controlled Vital Signs Assessment: post-procedure vital signs reviewed and stable Respiratory status: spontaneous breathing, nonlabored ventilation, respiratory function stable and patient connected to nasal cannula oxygen Cardiovascular status: blood pressure returned to baseline and stable Postop Assessment: no apparent nausea or vomiting Anesthetic complications: no    Last Vitals:  Vitals:   05/03/18 0738 05/03/18 1157  BP: 106/63 104/61  Pulse: 68 64  Resp: 18 16  Temp: 37.1 C 37 C  SpO2: 100% 98%    Last Pain:  Vitals:   05/03/18 1157  TempSrc: Oral  PainSc:                  Tiajuana Amass

## 2018-05-03 NOTE — Evaluation (Signed)
Occupational Therapy Evaluation Patient Details Name: Ashley Griffith MRN: [id number] DOB: 09/29/1945 Today's Date: 05/03/2018    History of Present Illness 73 y.o. female admitted for Left Lumbar four-five Transforaminal lumbar interbody fusion    Clinical Impression   Patient is s/p L l4-5 TLIF surgery resulting in functional limitations due to the deficits listed below (see OT problem list). Pt currently with narrowed base of support with transfer and scissor like pattern.  Patient will benefit from skilled OT acutely to increase independence and safety with ADLS to allow discharge home.     Follow Up Recommendations  No OT follow up    Equipment Recommendations  None recommended by OT    Recommendations for Other Services       Precautions / Restrictions Precautions Precautions: Back Precaution Booklet Issued: Yes (comment) Precaution Comments: educated on back precautions with adls Required Braces or Orthoses: Spinal Brace Spinal Brace: Lumbar corset;Applied in sitting position Restrictions Weight Bearing Restrictions: No      Mobility Bed Mobility Overal bed mobility: Needs Assistance Bed Mobility: Rolling;Sidelying to Sit Rolling: Supervision Sidelying to sit: Supervision       General bed mobility comments: cues for sequence  Transfers Overall transfer level: Needs assistance Equipment used: Rolling walker (2 wheeled) Transfers: Sit to/from Stand Sit to Stand: Supervision         General transfer comment: VCs hand placement    Balance Overall balance assessment: Mild deficits observed, not formally tested                                         ADL either performed or assessed with clinical judgement   ADL Overall ADL's : Needs assistance/impaired Eating/Feeding: Independent   Grooming: Wash/dry face;Min guard;Standing Grooming Details (indicate cue type and reason): cues for back precautions and use of cup method             Lower Body Dressing: Min guard;Sit to/from stand Lower Body Dressing Details (indicate cue type and reason): able to cross bil Le in figure 4. pt cues for hand placment for sit <>stand Toilet Transfer: Min Fish farm manager Details (indicate cue type and reason): pt abandoned RW outside bathroom door and needed cues to keep RW with patient.  Toileting- Water quality scientist and Hygiene: Min guard Toileting - Clothing Manipulation Details (indicate cue type and reason): cues to avoid bending     Functional mobility during ADLs: Rolling walker;Min guard General ADL Comments: pt requires cues for placement of back brace . pt wearing brace too high. pt needs cues for RW safety.   Back handout provided and reviewed adls in detail. Pt educated on: clothing between brace, never sleep in brace, set an alarm at night for medication, avoid sitting for long periods of time, correct bed positioning for sleeping, correct sequence for bed mobility, avoiding lifting more than 5 pounds and never wash directly over incision.      Vision Baseline Vision/History: Wears glasses Wears Glasses: At all times       Perception     Praxis      Pertinent Vitals/Pain Pain Assessment: 0-10 Pain Score: 5  Pain Location: back and LLE Pain Descriptors / Indicators: Sore Pain Intervention(s): Monitored during session;Premedicated before session;Repositioned     Hand Dominance Left   Extremity/Trunk Assessment Upper Extremity Assessment Upper Extremity Assessment: Overall WFL for tasks assessed   Lower Extremity Assessment Lower  Extremity Assessment: Defer to PT evaluation   Cervical / Trunk Assessment Cervical / Trunk Assessment: Normal   Communication Communication Communication: No difficulties   Cognition Arousal/Alertness: Awake/alert Behavior During Therapy: WFL for tasks assessed/performed Overall Cognitive Status: Within Functional Limits for tasks assessed                                      General Comments  pt noted to have a narrowed gait and alternating steps enough to avoid a scissor gait but with a scissor pattern     Exercises     Shoulder Instructions      Home Living Family/patient expects to be discharged to:: Private residence Living Arrangements: Spouse/significant other   Type of Home: House Home Access: Stairs to enter Technical brewer of Steps: 2 Entrance Stairs-Rails: None Home Layout: Two level;Bed/bath upstairs Alternate Level Stairs-Number of Steps: flight Alternate Level Stairs-Rails: Right Bathroom Shower/Tub: Occupational psychologist: Handicapped height     Home Equipment: Shower seat - built in;Grab bars - tub/shower          Prior Functioning/Environment Level of Independence: Independent        Comments: had L foot drop for ~ 1 month prior to admission        OT Problem List: Decreased strength;Decreased activity tolerance;Impaired balance (sitting and/or standing);Decreased cognition;Decreased safety awareness;Decreased knowledge of use of DME or AE;Decreased knowledge of precautions;Pain      OT Treatment/Interventions: Self-care/ADL training;Therapeutic exercise;Neuromuscular education;Energy conservation;DME and/or AE instruction;Manual therapy;Therapeutic activities;Cognitive remediation/compensation;Visual/perceptual remediation/compensation;Patient/family education;Balance training    OT Goals(Current goals can be found in the care plan section) Acute Rehab OT Goals Patient Stated Goal: none stated OT Goal Formulation: With patient Time For Goal Achievement: 05/17/18 Potential to Achieve Goals: Good  OT Frequency: Min 2X/week   Barriers to D/C:            Co-evaluation              AM-PAC OT "6 Clicks" Daily Activity     Outcome Measure Help from another person eating meals?: None Help from another person taking care of personal grooming?: A Little Help from  another person toileting, which includes using toliet, bedpan, or urinal?: A Little Help from another person bathing (including washing, rinsing, drying)?: A Little Help from another person to put on and taking off regular upper body clothing?: A Little Help from another person to put on and taking off regular lower body clothing?: A Little 6 Click Score: 19   End of Session Equipment Utilized During Treatment: Gait belt;Rolling walker Nurse Communication: Mobility status;Precautions  Activity Tolerance: Patient tolerated treatment well Patient left: in bed;with call bell/phone within reach;with bed alarm set  OT Visit Diagnosis: Unsteadiness on feet (R26.81);Muscle weakness (generalized) (M62.81)                Time: 3086-5784 OT Time Calculation (min): 29 min Charges:  OT General Charges $OT Visit: 1 Visit OT Evaluation $OT Eval Moderate Complexity: 1 Mod OT Treatments $Self Care/Home Management : 8-22 mins   Jeri Modena, OTR/L  Acute Rehabilitation Services Pager: [phone] Office: [phone] .   Jeri Modena 05/03/2018, 3:52 PM

## 2018-05-03 NOTE — Care Management Note (Signed)
Case Management Note  Patient Details  Name: Ashley Griffith MRN: [id number] Date of Birth: 1945-09-23  Subjective/Objective:     Pt s/p lumbar surgery. She is from home with her spouse. DME; none No issues obtaining her home meds and no issues with transportation.               Action/Plan: Pt with orders for walker and 3 in 1. James with [facility] DME notified and will deliver to the room. Pt states her spouse can provide assistance at home. He also can provide transportation home.  Expected Discharge Date:                  Expected Discharge Plan:  Home/Self Care  In-House Referral:     Discharge planning Services  CM Consult  Post Acute Care Choice:  Durable Medical Equipment Choice offered to:  Patient  DME Arranged:  3-N-1, Walker rolling DME Agency:  [place]:    [place]:     Status of Service:  Completed, signed off  If discussed at [place] of Stay Meetings, dates discussed:    Additional Comments:  Pollie Friar, RN 05/03/2018, 3:12 PM

## 2018-05-03 NOTE — Progress Notes (Addendum)
Subjective: Patient reports "i have some sciatic pain in this left leg"  Objective: Vital signs in last 24 hours: Temp:  [97.4 F (36.3 C)-98.8 F (37.1 C)] 98.8 F (37.1 C) (01/10 0738) Pulse Rate:  [59-76] 68 (01/10 0738) Resp:  [8-20] 18 (01/10 0738) BP: (106-150)/(60-88) 106/63 (01/10 0738) SpO2:  [97 %-100 %] 100 % (01/10 0738) Weight:  [69.9 kg-77.4 kg] 77.4 kg (01/09 2000)  Intake/Output from previous day: 01/09 0701 - 01/10 0700 In: 2336.9 [P.O.:120; I.V.:2066.9; IV Piggyback:150] Out: 2125 [Urine:1975; Blood:150] Intake/Output this shift: No intake/output data recorded.  Alert, conversant. reports intermittent LLE pain. Improved LLE strength. Incision without erythema, swelling or drainage beneath homneycomb and dermabond.   Lab Results: No results for input(s): WBC, HGB, HCT, PLT in the last 72 hours. BMET No results for input(s): NA, K, CL, CO2, GLUCOSE, BUN, CREATININE, CALCIUM in the last 72 hours.  Studies/Results: Dg Lumbar Spine 2-3 Views  Result Date: 05/02/2018 CLINICAL DATA:  Status post surgical posterior fusion of L4-5. EXAM: DG C-ARM 61-120 MIN; LUMBAR SPINE - 2-3 VIEW FLUOROSCOPY TIME:  45 seconds. COMPARISON:  Radiographs of April 03, 2018. FINDINGS: Four intraoperative fluoroscopic images were obtained of the lower lumbar spine. These demonstrate the patient be status post surgical posterior fusion of L4-5 with bilateral intrapedicular screw placement and interbody fusion. IMPRESSION: Fluoroscopic guidance provided during posterior fusion of L4-5. Electronically Signed   By: Marijo Conception, M.D.   On: 05/02/2018 16:13   Dg C-arm 1-60 Min  Result Date: 05/02/2018 CLINICAL DATA:  Status post surgical posterior fusion of L4-5. EXAM: DG C-ARM 61-120 MIN; LUMBAR SPINE - 2-3 VIEW FLUOROSCOPY TIME:  45 seconds. COMPARISON:  Radiographs of April 03, 2018. FINDINGS: Four intraoperative fluoroscopic images were obtained of the lower lumbar spine. These  demonstrate the patient be status post surgical posterior fusion of L4-5 with bilateral intrapedicular screw placement and interbody fusion. IMPRESSION: Fluoroscopic guidance provided during posterior fusion of L4-5. Electronically Signed   By: Marijo Conception, M.D.   On: 05/02/2018 16:13    Assessment/Plan: Improving  LOS: 1 day  Mobilize in LSO.    Verdis Prime 05/03/2018, 8:07 AM  Patient is doing well.  Strength is improved.  Leg pain is intermittent.  Will mobilize with PT.

## 2018-05-04 MED ORDER — RIVAROXABAN 20 MG PO TABS: 20.0000 mg | ORAL_TABLET | Freq: Every day | ORAL | Status: AC

## 2018-05-04 MED ORDER — ADALIMUMAB 40 MG/0.8ML ~~LOC~~ PSKT: 40.0000 mg | PREFILLED_SYRINGE | SUBCUTANEOUS | Status: DC

## 2018-05-04 MED ORDER — OXYCODONE-ACETAMINOPHEN 5-325 MG PO TABS: 1.0000 | ORAL_TABLET | ORAL | 0 refills | Status: DC | PRN

## 2018-05-04 MED ORDER — METHOCARBAMOL 500 MG PO TABS: 500.0000 mg | ORAL_TABLET | Freq: Four times a day (QID) | ORAL | 1 refills | Status: DC | PRN

## 2018-05-04 MED ORDER — METHOTREXATE SODIUM 10 MG PO TABS: 10.0000 mg | ORAL_TABLET | ORAL | 0 refills | Status: DC

## 2018-05-04 NOTE — Plan of Care (Signed)
  Problem: Health Behavior/Discharge Planning: Goal: Ability to manage health-related needs will improve Outcome: Adequate for Discharge   Problem: Clinical Measurements: Goal: Ability to maintain clinical measurements within normal limits will improve Outcome: Adequate for Discharge Goal: Will remain free from infection Outcome: Adequate for Discharge Goal: Respiratory complications will improve Outcome: Adequate for Discharge   Problem: Activity: Goal: Risk for activity intolerance will decrease Outcome: Adequate for Discharge   Problem: Nutrition: Goal: Adequate nutrition will be maintained Outcome: Adequate for Discharge   Problem: Elimination: Goal: Will not experience complications related to bowel motility Outcome: Adequate for Discharge   Problem: Pain Managment: Goal: General experience of comfort will improve Outcome: Adequate for Discharge   Problem: Safety: Goal: Ability to remain free from injury will improve Outcome: Adequate for Discharge   Problem: Skin Integrity: Goal: Risk for impaired skin integrity will decrease Outcome: Adequate for Discharge   Problem: Activity: Goal: Ability to tolerate increased activity will improve Outcome: Adequate for Discharge   Problem: Bowel/Gastric: Goal: Gastrointestinal status for postoperative course will improve Outcome: Adequate for Discharge   Problem: Clinical Measurements: Goal: Ability to maintain clinical measurements within normal limits will improve Outcome: Adequate for Discharge Goal: Postoperative complications will be avoided or minimized Outcome: Adequate for Discharge   Problem: Pain Management: Goal: Pain level will decrease Outcome: Adequate for Discharge   Problem: Skin Integrity: Goal: Will show signs of wound healing Outcome: Adequate for Discharge   Problem: Bladder/Genitourinary: Goal: Urinary functional status for postoperative course will improve Outcome: Adequate for Discharge

## 2018-05-04 NOTE — Progress Notes (Signed)
Physical Therapy Treatment Patient Details Name: Ashley Griffith MRN: [id number] DOB: 1946/03/20 Today's Date: 05/04/2018    History of Present Illness 73 y.o. female admitted for Left Lumbar four-five Transforaminal lumbar interbody fusion     PT Comments    Pt increased ambulation distance and negotiated steps during today's session. She required min guard for all OOB activities. Expected to d/c home today.  Follow Up Recommendations  Follow surgeon's recommendation for DC plan and follow-up therapies     Equipment Recommendations  Rolling walker with 5" wheels;3in1 (PT)    Recommendations for Other Services       Precautions / Restrictions Precautions Precautions: Back Precaution Booklet Issued: Yes (comment) Precaution Comments: pt able to state back precautions, but requires min cues to adhere to them  Required Braces or Orthoses: Spinal Brace Spinal Brace: Lumbar corset;Applied in sitting position Restrictions Weight Bearing Restrictions: No    Mobility  Bed Mobility Overal bed mobility: Modified Independent                Transfers Overall transfer level: Needs assistance Equipment used: Rolling walker (2 wheeled) Transfers: Sit to/from [facility] Sit to Stand: Supervision Stand pivot transfers: Supervision       General transfer comment: verbal cues for hand placement to avoid twisting with transfer  Ambulation/Gait Ambulation/Gait assistance: Min guard Gait Distance (Feet): 300 Feet Assistive device: Rolling walker (2 wheeled) Gait Pattern/deviations: Step-through pattern     General Gait Details: Noted foot drop on R LE, though pt reports it has inproved since surgery. Min guard for safety   Stairs Stairs: Yes Stairs assistance: Min guard Stair Management: One rail Right;Step to pattern;Sideways;Forwards Number of Stairs: 4 General stair comments: Negotiated steps twice, once sideways and once forwards leading with  strong side. Pt appeared steady with both attempts.   Wheelchair Mobility    Modified Rankin (Stroke Patients Only)       Balance Overall balance assessment: Mild deficits observed, not formally tested                                          Cognition Arousal/Alertness: Awake/alert Behavior During Therapy: WFL for tasks assessed/performed Overall Cognitive Status: Within Functional Limits for tasks assessed                                        Exercises      General Comments General comments (skin integrity, edema, etc.): reviewed safety with IADLs       Pertinent Vitals/Pain Pain Assessment: 0-10 Pain Score: 5  Pain Location: back and LLE Pain Descriptors / Indicators: Sore;Aching Pain Intervention(s): Monitored during session    Home Living                      Prior Function            PT Goals (current goals can now be found in the care plan section) Acute Rehab PT Goals Patient Stated Goal: none stated PT Goal Formulation: With patient Time For Goal Achievement: 05/10/18 Potential to Achieve Goals: Good Progress towards PT goals: Progressing toward goals    Frequency    Min 6X/week      PT Plan Current plan remains appropriate    Co-evaluation  AM-PAC PT "6 Clicks" Mobility   Outcome Measure  Help needed turning from your back to your side while in a flat bed without using bedrails?: None Help needed moving from lying on your back to sitting on the side of a flat bed without using bedrails?: None Help needed moving to and from a bed to a chair (including a wheelchair)?: A Little Help needed standing up from a chair using your arms (e.g., wheelchair or bedside chair)?: A Little Help needed to walk in hospital room?: A Little Help needed climbing 3-5 steps with a railing? : A Little 6 Click Score: 20    End of Session Equipment Utilized During Treatment: Gait belt;Back  brace Activity Tolerance: Patient tolerated treatment well Patient left: in bed;with call bell/phone within reach Nurse Communication: Mobility status PT Visit Diagnosis: Pain;Difficulty in walking, not elsewhere classified (R26.2);Muscle weakness (generalized) (M62.81) Pain - Right/Left: Left Pain - part of body: Leg     Time: 5686-1683 PT Time Calculation (min) (ACUTE ONLY): 12 min  Charges:  $Gait Training: 8-22 mins                    Benjiman Core, [place] Pager [phone] Acute Rehab   Allena Katz 05/04/2018, 11:58 AM

## 2018-05-04 NOTE — Progress Notes (Signed)
Occupational Therapy Treatment Patient Details Name: Ashley Griffith MRN: [id number] DOB: Apr 20, 1946 Today's Date: 05/04/2018    History of present illness 73 y.o. female admitted for Left Lumbar four-five Transforaminal lumbar interbody fusion    OT comments  Pt is able to perform ADLs with supervision.  She requires min cues to adhere to back precautions.  She has good support at home.  Pain 5/10.   Follow Up Recommendations  No OT follow up    Equipment Recommendations  None recommended by OT    Recommendations for Other Services      Precautions / Restrictions Precautions Precautions: Back Precaution Comments: pt able to state back precautions, but requires min cues to adhere to them  Required Braces or Orthoses: Spinal Brace Spinal Brace: Lumbar corset;Applied in sitting position       Mobility Bed Mobility Overal bed mobility: Modified Independent                Transfers Overall transfer level: Needs assistance Equipment used: Rolling walker (2 wheeled) Transfers: Sit to/from [facility] Sit to Stand: Supervision Stand pivot transfers: Supervision       General transfer comment: verbal cues for hand placement     Balance Overall balance assessment: Mild deficits observed, not formally tested                                         ADL either performed or assessed with clinical judgement   ADL Overall ADL's : Needs assistance/impaired     Grooming: Wash/dry hands;Wash/dry face;Oral care;Brushing hair;Supervision/safety;Standing Grooming Details (indicate cue type and reason): pt able to demonstrate the appropriate technique              Lower Body Dressing: Supervision/safety;Sit to/from stand Lower Body Dressing Details (indicate cue type and reason): discussed use of AE for LB Bathing  Toilet Transfer: Supervision/safety;Ambulation;Comfort height toilet;RW   Toileting- Clothing Manipulation and  Hygiene: Supervision/safety;Sit to/from stand   Tub/ Shower Transfer: Walk-in shower;Supervision/safety;Ambulation;Grab bars;Rolling walker   Functional mobility during ADLs: Supervision/safety;Rolling walker General ADL Comments: 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      Vision       Perception     Praxis      Cognition Arousal/Alertness: Awake/alert Behavior During Therapy: WFL for tasks assessed/performed Overall Cognitive Status: Within Functional Limits for tasks assessed                                          Exercises     Shoulder Instructions       General Comments reviewed safety with IADLs     Pertinent Vitals/ Pain       Pain Assessment: 0-10 Pain Score: 5  Pain Location: back and LLE Pain Descriptors / Indicators: Sore;Aching Pain Intervention(s): Monitored during session;Repositioned;RN gave pain meds during session  Home Living                                          Prior Functioning/Environment              Frequency  Min 2X/week        Progress Toward Goals  OT Goals(current goals can  now be found in the care plan section)  Progress towards OT goals: Progressing toward goals     Plan Discharge plan remains appropriate    Co-evaluation                 AM-PAC OT "6 Clicks" Daily Activity     Outcome Measure   Help from another person eating meals?: None Help from another person taking care of personal grooming?: A Little Help from another person toileting, which includes using toliet, bedpan, or urinal?: A Little Help from another person bathing (including washing, rinsing, drying)?: A Little Help from another person to put on and taking off regular upper body clothing?: A Little Help from another person to put on and taking off regular lower body clothing?: A Little 6 Click Score: 19    End of Session Equipment Utilized During Treatment: Rolling walker;Back brace  OT Visit  Diagnosis: Unsteadiness on feet (R26.81);Muscle weakness (generalized) (M62.81)   Activity Tolerance Patient tolerated treatment well   Patient Left in bed;with call bell/phone within reach   Nurse Communication          Time: 0865-7846 OT Time Calculation (min): 25 min  Charges: OT General Charges $OT Visit: 1 Visit OT Treatments $Self Care/Home Management : 23-37 mins  Lucille Passy, OTR/L [place] Pager [phone] Office [phone]    Lucille Passy M 05/04/2018, 9:47 AM

## 2018-05-04 NOTE — Progress Notes (Signed)
  NEUROSURGERY PROGRESS NOTE   No issues overnight.  Complains of appropriate back soreness. LLE pain remains intermittent. Pain well controlled. Voiding normal. Ambulating. Eager for d/c  EXAM:  BP 140/72 (BP Location: Left Arm)   Pulse 80   Temp 98.2 F (36.8 C) (Oral)   Resp 18   Ht 5\' 5"  (1.651 m)   Wt 77.4 kg   SpO2 95%   BMI 28.40 kg/m   Awake, alert, oriented  Speech fluent, appropriate  CN grossly intact  Grossly normal with exception left hip flexor 4/5 (pain mediated)  PLAN Stable this morning Ready for d/c

## 2018-05-04 NOTE — Discharge Summary (Signed)
Physician Discharge Summary  Patient ID: Ashley Griffith MRN: [id number] DOB/AGE: [birth year] 73 y.o.  Admit date: 05/02/2018 Discharge date: 05/04/2018  Admission Diagnoses:  Lumbar spondylolisthesis   Discharge Diagnoses:  Same Active Problems:   Spondylolisthesis of lumbar region   Discharged Condition: Stable  Hospital Course:  Ashley Griffith is a 73 y.o. female who was admitted for the below procedure. There were no post operative complications. At time of discharge, pain was well controlled, ambulating with Pt/OT, tolerating po, voiding normal. Ready for discharge.  Treatments: Surgery Left Lumbar four-five Transforaminal lumbar interbody fusion (Left) - Left Lumbar four-five Transforaminal lumbar interbody fusion with TLX titanium cage, autograft, pedicle screw fixation, posterolateral arthrodesis  Discharge Exam: Blood pressure 140/72, pulse 80, temperature 98.2 F (36.8 C), temperature source Oral, resp. rate 18, height 5\' 5"  (1.651 m), weight 77.4 kg, SpO2 95 %. Awake, alert, oriented Speech fluent, appropriate CN grossly intact MAEW Wound c/d/i  Disposition: Discharge disposition: 01-Home or Self Care       Discharge Instructions    Call MD for:  difficulty breathing, headache or visual disturbances   Complete by:  As directed    Call MD for:  persistant dizziness or light-headedness   Complete by:  As directed    Call MD for:  redness, tenderness, or signs of infection (pain, swelling, redness, odor or green/yellow discharge around incision site)   Complete by:  As directed    Call MD for:  severe uncontrolled pain   Complete by:  As directed    Call MD for:  temperature >100.4   Complete by:  As directed    Diet general   Complete by:  As directed    Driving Restrictions   Complete by:  As directed    Do not drive until given clearance.   Increase activity slowly   Complete by:  As directed    Lifting restrictions   Complete by:  As  directed    Do not lift anything >10lbs. Avoid bending and twisting in awkward positions. Avoid bending at the back.   May shower / Bathe   Complete by:  As directed    In 24 hours. Okay to wash wound with warm soapy water. Avoid scrubbing the wound. Pat dry.   Remove dressing in 24 hours   Complete by:  As directed      Allergies as of 05/04/2018      Reactions   Erythromycin Hives, Nausea And Vomiting   Penicillins Swelling, Rash, Other (See Comments)   PATIENT HAS HAD A PCN REACTION WITH IMMEDIATE RASH, FACIAL/TONGUE/THROAT SWELLING, SOB, OR LIGHTHEADEDNESS WITH HYPOTENSION:  #  #  YES  #  #  Has patient had a PCN reaction causing severe rash involving mucus membranes or skin necrosis: No Has patient had a PCN reaction that required hospitalization No Has patient had a PCN reaction occurring within the last 10 years: No If all of the above answers are "NO", then may proceed with Cephalosporin use.   Adhesive [tape] Rash   Kenalog [triamcinolone Acetonide] Other (See Comments)   Facial flush   Monistat [miconazole] Itching, Other (See Comments)   Burning   Secukinumab Itching   Thimerosal Itching, Other (See Comments)   Red and blurry eyes      Medication List    TAKE these medications   acetaminophen 650 MG CR tablet Commonly known as:  TYLENOL Take 650 mg by mouth 2 (two) times daily as needed for pain.  Adalimumab 40 MG/0.8ML Pskt Commonly known as:  HUMIRA Inject 0.8 mLs (40 mg total) into the skin every 14 (fourteen) days. Start taking on:  May 18, 2018   atorvastatin 10 MG tablet Commonly known as:  LIPITOR Take 10 mg by mouth every morning.   Biotin 1000 MCG tablet Take 1,000 mcg by mouth daily.   buPROPion 150 MG 24 hr tablet Commonly known as:  WELLBUTRIN XL Take 300 mg by mouth daily before breakfast.   Calcium Carbonate 500 MG Chew Chew 1,500 mg by mouth at bedtime as needed (for reflux or indigestion).   CALCIUM CITRATE + D PO Take 1 tablet by  mouth at bedtime.   celecoxib 200 MG capsule Commonly known as:  CELEBREX Take 200 mg by mouth 2 (two) times daily.   fluticasone 50 MCG/ACT nasal spray Commonly known as:  FLONASE Place 1 spray into the nose daily as needed for allergies.   folic acid 161 MCG tablet Commonly known as:  FOLVITE Take 800 mcg by mouth every morning.   gabapentin 300 MG capsule Commonly known as:  NEURONTIN Take 300 mg by mouth at bedtime.   methocarbamol 500 MG tablet Commonly known as:  ROBAXIN Take 1 tablet (500 mg total) by mouth every 6 (six) hours as needed for muscle spasms.   methotrexate 10 MG tablet Commonly known as:  RHEUMATREX Take 1 tablet (10 mg total) by mouth once a week. Caution: Chemotherapy. Protect from light. Start taking on:  May 18, 2018 What changed:  These instructions start on May 18, 2018. If you are unsure what to do until then, ask your doctor or other care provider.   metroNIDAZOLE 1 % gel Commonly known as:  METROGEL Apply 1 application topically 2 (two) times daily. To face   mupirocin ointment 2 % Commonly known as:  BACTROBAN Place 1 application into the nose 2 (two) times daily. Sore inside of nose   omeprazole 40 MG capsule Commonly known as:  PRILOSEC Take 20 mg by mouth.   oxyCODONE-acetaminophen 5-325 MG tablet Commonly known as:  PERCOCET Take 1 tablet by mouth every 4 (four) hours as needed for severe pain.   rivaroxaban 20 MG Tabs tablet Commonly known as:  XARELTO Take 1 tablet (20 mg total) by mouth daily with supper. Start taking on:  May 10, 2018 What changed:  These instructions start on May 10, 2018. If you are unsure what to do until then, ask your doctor or other care provider.   SYNTHROID 88 MCG tablet Generic drug:  levothyroxine Take 88 mcg by mouth daily.   THERATEARS 0.25 % Soln Generic drug:  Carboxymethylcellulose Sodium Take by mouth.   Vitamin D (Ergocalciferol) 1.25 MG (50000 UT) Caps capsule Commonly  known as:  DRISDOL Take 50,000 Units by mouth every 7 (seven) days.   XIIDRA 5 % Soln Generic drug:  Lifitegrast Place 1 drop into both eyes 2 (two) times daily.            Durable Medical Equipment  (From admission, onward)         Start     Ordered   05/03/18 1510  For home use only DME 3 n 1  Once     05/03/18 1510   05/03/18 1510  For home use only DME Walker rolling  Once    Question:  Patient needs a walker to treat with the following condition  Answer:  S/P lumbar fusion   05/03/18 1510  Follow-up Information    Erline Levine, MD Follow up.   Specialty:  Neurosurgery Contact information: [address] [place] [address] [phone]           Signed: Traci Sermon 05/04/2018, 7:06 AM

## 2018-05-06 ENCOUNTER — Encounter (HOSPITAL_COMMUNITY): Payer: Self-pay | Admitting: Neurosurgery

## 2018-05-15 ENCOUNTER — Ambulatory Visit: Payer: Medicare HMO | Admitting: Neurology

## 2018-05-15 ENCOUNTER — Encounter

## 2018-05-15 DIAGNOSIS — M5416 Radiculopathy, lumbar region: Secondary | ICD-10-CM | POA: Diagnosis not present

## 2018-05-15 DIAGNOSIS — M4316 Spondylolisthesis, lumbar region: Secondary | ICD-10-CM | POA: Diagnosis not present

## 2018-05-15 DIAGNOSIS — M545 Low back pain: Secondary | ICD-10-CM | POA: Diagnosis not present

## 2018-05-15 DIAGNOSIS — M7130 Other bursal cyst, unspecified site: Secondary | ICD-10-CM | POA: Diagnosis not present

## 2018-05-15 DIAGNOSIS — M21372 Foot drop, left foot: Secondary | ICD-10-CM | POA: Diagnosis not present

## 2018-05-15 DIAGNOSIS — M48061 Spinal stenosis, lumbar region without neurogenic claudication: Secondary | ICD-10-CM | POA: Diagnosis not present

## 2018-06-19 DIAGNOSIS — E559 Vitamin D deficiency, unspecified: Secondary | ICD-10-CM | POA: Diagnosis not present

## 2018-06-19 DIAGNOSIS — Z79899 Other long term (current) drug therapy: Secondary | ICD-10-CM | POA: Diagnosis not present

## 2018-06-19 DIAGNOSIS — E663 Overweight: Secondary | ICD-10-CM | POA: Diagnosis not present

## 2018-06-19 DIAGNOSIS — L4050 Arthropathic psoriasis, unspecified: Secondary | ICD-10-CM | POA: Diagnosis not present

## 2018-06-19 DIAGNOSIS — L408 Other psoriasis: Secondary | ICD-10-CM | POA: Diagnosis not present

## 2018-06-19 DIAGNOSIS — M1710 Unilateral primary osteoarthritis, unspecified knee: Secondary | ICD-10-CM | POA: Diagnosis not present

## 2018-06-19 DIAGNOSIS — Z6825 Body mass index (BMI) 25.0-25.9, adult: Secondary | ICD-10-CM | POA: Diagnosis not present

## 2018-06-19 DIAGNOSIS — M542 Cervicalgia: Secondary | ICD-10-CM | POA: Diagnosis not present

## 2018-06-24 DIAGNOSIS — M4316 Spondylolisthesis, lumbar region: Secondary | ICD-10-CM | POA: Diagnosis not present

## 2018-06-24 DIAGNOSIS — M545 Low back pain: Secondary | ICD-10-CM | POA: Diagnosis not present

## 2018-06-24 DIAGNOSIS — M48061 Spinal stenosis, lumbar region without neurogenic claudication: Secondary | ICD-10-CM | POA: Diagnosis not present

## 2018-06-24 DIAGNOSIS — M7130 Other bursal cyst, unspecified site: Secondary | ICD-10-CM | POA: Diagnosis not present

## 2018-07-29 ENCOUNTER — Ambulatory Visit: Payer: Medicare HMO | Admitting: Physical Therapy

## 2018-07-30 DIAGNOSIS — M1712 Unilateral primary osteoarthritis, left knee: Secondary | ICD-10-CM | POA: Diagnosis not present

## 2018-07-30 DIAGNOSIS — M170 Bilateral primary osteoarthritis of knee: Secondary | ICD-10-CM | POA: Diagnosis not present

## 2018-07-30 DIAGNOSIS — M1711 Unilateral primary osteoarthritis, right knee: Secondary | ICD-10-CM | POA: Diagnosis not present

## 2018-08-06 DIAGNOSIS — M1712 Unilateral primary osteoarthritis, left knee: Secondary | ICD-10-CM | POA: Diagnosis not present

## 2018-08-06 DIAGNOSIS — M25561 Pain in right knee: Secondary | ICD-10-CM | POA: Diagnosis not present

## 2018-08-06 DIAGNOSIS — M1711 Unilateral primary osteoarthritis, right knee: Secondary | ICD-10-CM | POA: Diagnosis not present

## 2018-08-06 DIAGNOSIS — M170 Bilateral primary osteoarthritis of knee: Secondary | ICD-10-CM | POA: Diagnosis not present

## 2018-08-08 DIAGNOSIS — S62306A Unspecified fracture of fifth metacarpal bone, right hand, initial encounter for closed fracture: Secondary | ICD-10-CM | POA: Diagnosis not present

## 2018-08-13 DIAGNOSIS — M170 Bilateral primary osteoarthritis of knee: Secondary | ICD-10-CM | POA: Diagnosis not present

## 2018-08-15 DIAGNOSIS — M79644 Pain in right finger(s): Secondary | ICD-10-CM | POA: Diagnosis not present

## 2018-08-15 DIAGNOSIS — S62326A Displaced fracture of shaft of fifth metacarpal bone, right hand, initial encounter for closed fracture: Secondary | ICD-10-CM | POA: Diagnosis not present

## 2018-08-15 NOTE — H&P (Signed)
Ashley Griffith is an 73 y.o. female.   Chief Complaint: RIGHT HAND INJURY   HPI: The patient is a 73 y/o left hand dominant female who slipped on a pillow case while doing laundry on 08/08/18 causing an injury to the right hand.  She was seen and treated with a splint and antiinflammatories.  She was seen in our office for further treatment due to continued swelling, pain, and stiffness. Discussed the reason and rationale for surgery.  She is here today for surgery.  She denies chest pain, shortness of breath, fever, chills, nausea, vomiting, or diarrhea.    Past Medical History:  Diagnosis Date  . Arthritis   . Blood clot in vein    leg 11-23-1004  . Depression   . GERD (gastroesophageal reflux disease)   . Hemorrhoids    Patient notes intermittent rectal bleeding from these  . Hypothyroidism    Seen by Dr. Wilson Singer  . Obesity    Status post significant intentional weight loss in 2003 of > 90 pounds  . Osteoarthritis   . PONV (postoperative nausea and vomiting)   . Psoriatic arthritis ([place])    Follows with Dr. Naida Sleight  . Pulmonary embolism ([place]) march 1995  . Rosacea     Past Surgical History:  Procedure Laterality Date  . blood mass removed from abdomen  1972   ? questionable ectopic pregnancy  . childbirth  1976  . colonscopy     x 2  . KNEE ARTHROSCOPY Right 09/18/2012   Procedure: RIGHT ARTHROSCOPY KNEE, Tlateral meniscal debdridement and chondroplasty;  Surgeon: Gearlean Alf, MD;  Location: [facility];  Service: Orthopedics;  Laterality: Right;  . LUMBAR LAMINECTOMY/DECOMPRESSION MICRODISCECTOMY Right 09/16/2015   Procedure: Right Lumbar Four-FiveLaminectomy with resection of synovial cyst;  Surgeon: Erline Levine, MD;  Location: [place] [facility];  Service: Neurosurgery;  Laterality: Right;  right  . nasal poly removed  age 80  . sclorotomy both legs veins  2005 or 2006  . SPHINCTEROTOMY  1990's  . TONSILLECTOMY  as child  . TRANSFORAMINAL LUMBAR INTERBODY FUSION  (TLIF) WITH PEDICLE SCREW FIXATION 1 LEVEL Left 05/02/2018   Procedure: Left Lumbar four-five Transforaminal lumbar interbody fusion;  Surgeon: Erline Levine, MD;  Location: [place];  Service: Neurosurgery;  Laterality: Left;  Left Lumbar four-five Transforaminal lumbar interbody fusion    Family History  Problem Relation Age of Onset  . Kidney failure Mother        At age 47  . Heart attack Father        At age 17  . Acute myelogenous leukemia Brother        At age 53  . Cancer Maternal Aunt        Breast cancer   Social History:  reports that she quit smoking about 45 years ago. Her smoking use included cigarettes. She has a 5.00 pack-year smoking history. She has never used smokeless tobacco. She reports current alcohol use. She reports that she does not use drugs.  Allergies:  Allergies  Allergen Reactions  . Erythromycin Hives and Nausea And Vomiting  . Penicillins Swelling, Rash and Other (See Comments)    PATIENT HAS HAD A PCN REACTION WITH IMMEDIATE RASH, FACIAL/TONGUE/THROAT SWELLING, SOB, OR LIGHTHEADEDNESS WITH HYPOTENSION:  #  #  YES  #  #  Has patient had a PCN reaction causing severe rash involving mucus membranes or skin necrosis: No Has patient had a PCN reaction that required hospitalization No Has patient had a PCN  reaction occurring within the last 10 years: No If all of the above answers are "NO", then may proceed with Cephalosporin use.   . Adhesive [Tape] Rash  . Kenalog [Triamcinolone Acetonide] Other (See Comments)    Facial flush  . Monistat [Miconazole] Itching and Other (See Comments)    Burning  . Secukinumab Itching  . Thimerosal Itching and Other (See Comments)    Red and blurry eyes    No medications prior to admission.    No results found for this or any previous visit (from the past 48 hour(s)). No results found.  ROS NO RECENT ILLNESSES OR HOSPITALIZATIONS  There were no vitals taken for this visit. Physical Exam  General Appearance:   Alert, cooperative, no distress, appears stated age  Head:  Normocephalic, without obvious abnormality, atraumatic  Eyes:  Pupils equal, conjunctiva/corneas clear,         Throat: Lips, mucosa, and tongue normal; teeth and gums normal  Neck: No visible masses     Lungs:   respirations unlabored  Chest Wall:  No tenderness or deformity  Heart:  Regular rate and rhythm,  Abdomen:   Soft, non-tender,         Extremities: RIGHT HAND: FINGERS WARM WELL PERFUSED GOOD CAP REFIL LIMITED MOBILITY TO FINGERS GOOD WRIST FLEXION/EXTENSION  Pulses: 2+ and symmetric  Skin: Skin color, texture, turgor normal, no rashes or lesions     Neurologic: Normal     Assessment RIGHT FIFTH METACARPAL DISPLACED FRACTURE  Plan RIGHT FIFTH METACARPAL OPEN REDUCTION AND INTERNAL FIXATION WITH REPAIR AS INDICATED; POSSIBLE PERCUTANEOUS PINNING   WE ARE PLANNING SURGERY FOR YOUR UPPER EXTREMITY. THE RISKS AND BENEFITS OF SURGERY INCLUDE BUT NOT LIMITED TO BLEEDING INFECTION, DAMAGE TO NEARBY NERVES ARTERIES TENDONS, FAILURE OF SURGERY TO ACCOMPLISH ITS INTENDED GOALS, PERSISTENT SYMPTOMS AND NEED FOR FURTHER SURGICAL INTERVENTION. WITH THIS IN MIND WE WILL PROCEED. I HAVE DISCUSSED WITH THE PATIENT THE PRE AND POSTOPERATIVE REGIMEN AND THE DOS AND DON'TS. PT VOICED UNDERSTANDING AND INFORMED CONSENT SIGNED.  R/B/A DISCUSSED WITH PT IN OFFICE.  PT VOICED UNDERSTANDING OF PLAN CONSENT SIGNED DAY OF SURGERY PT SEEN AND EXAMINED PRIOR TO OPERATIVE PROCEDURE/DAY OF SURGERY SITE MARKED. QUESTIONS ANSWERED WILL [facility] FOLLOWING SURGERY   Iran Planas 08/19/18

## 2018-08-16 ENCOUNTER — Encounter (HOSPITAL_BASED_OUTPATIENT_CLINIC_OR_DEPARTMENT_OTHER): Payer: Self-pay | Admitting: *Deleted

## 2018-08-16 ENCOUNTER — Other Ambulatory Visit: Payer: Self-pay

## 2018-08-19 NOTE — Progress Notes (Signed)
Ensure Pre-Surgery drink given to patient with instructions to complete by 0400 DOS.  Patient verbalized understanding of instructions.

## 2018-08-21 ENCOUNTER — Ambulatory Visit (HOSPITAL_BASED_OUTPATIENT_CLINIC_OR_DEPARTMENT_OTHER): Payer: Medicare HMO | Admitting: Anesthesiology

## 2018-08-21 ENCOUNTER — Ambulatory Visit (HOSPITAL_BASED_OUTPATIENT_CLINIC_OR_DEPARTMENT_OTHER)
Admission: RE | Admit: 2018-08-21 | Discharge: 2018-08-21 | Disposition: A | Discharge status: Home / Self Care | Payer: Medicare HMO | Attending: Orthopedic Surgery | Admitting: Orthopedic Surgery

## 2018-08-21 ENCOUNTER — Encounter (HOSPITAL_BASED_OUTPATIENT_CLINIC_OR_DEPARTMENT_OTHER)
Admission: RE | Disposition: A | Discharge status: Home / Self Care | Payer: Self-pay | Source: Home / Self Care | Attending: Orthopedic Surgery

## 2018-08-21 ENCOUNTER — Other Ambulatory Visit: Payer: Self-pay

## 2018-08-21 ENCOUNTER — Encounter (HOSPITAL_BASED_OUTPATIENT_CLINIC_OR_DEPARTMENT_OTHER): Payer: Self-pay

## 2018-08-21 DIAGNOSIS — S62326A Displaced fracture of shaft of fifth metacarpal bone, right hand, initial encounter for closed fracture: Secondary | ICD-10-CM | POA: Insufficient documentation

## 2018-08-21 DIAGNOSIS — Z87891 Personal history of nicotine dependence: Secondary | ICD-10-CM | POA: Insufficient documentation

## 2018-08-21 DIAGNOSIS — M1990 Unspecified osteoarthritis, unspecified site: Secondary | ICD-10-CM | POA: Diagnosis not present

## 2018-08-21 DIAGNOSIS — E039 Hypothyroidism, unspecified: Secondary | ICD-10-CM | POA: Diagnosis not present

## 2018-08-21 DIAGNOSIS — Z883 Allergy status to other anti-infective agents status: Secondary | ICD-10-CM | POA: Diagnosis not present

## 2018-08-21 DIAGNOSIS — Z880 Allergy status to penicillin: Secondary | ICD-10-CM | POA: Diagnosis not present

## 2018-08-21 DIAGNOSIS — Y93E2 Activity, laundry: Secondary | ICD-10-CM | POA: Diagnosis not present

## 2018-08-21 DIAGNOSIS — S62306A Unspecified fracture of fifth metacarpal bone, right hand, initial encounter for closed fracture: Secondary | ICD-10-CM | POA: Diagnosis not present

## 2018-08-21 DIAGNOSIS — F329 Major depressive disorder, single episode, unspecified: Secondary | ICD-10-CM | POA: Insufficient documentation

## 2018-08-21 DIAGNOSIS — M4316 Spondylolisthesis, lumbar region: Secondary | ICD-10-CM | POA: Diagnosis not present

## 2018-08-21 DIAGNOSIS — K219 Gastro-esophageal reflux disease without esophagitis: Secondary | ICD-10-CM | POA: Diagnosis not present

## 2018-08-21 DIAGNOSIS — Z881 Allergy status to other antibiotic agents status: Secondary | ICD-10-CM | POA: Insufficient documentation

## 2018-08-21 DIAGNOSIS — Z86711 Personal history of pulmonary embolism: Secondary | ICD-10-CM | POA: Diagnosis not present

## 2018-08-21 DIAGNOSIS — S62326D Displaced fracture of shaft of fifth metacarpal bone, right hand, subsequent encounter for fracture with routine healing: Secondary | ICD-10-CM | POA: Diagnosis not present

## 2018-08-21 DIAGNOSIS — R69 Illness, unspecified: Secondary | ICD-10-CM | POA: Diagnosis not present

## 2018-08-21 DIAGNOSIS — E669 Obesity, unspecified: Secondary | ICD-10-CM | POA: Insufficient documentation

## 2018-08-21 DIAGNOSIS — W010XXA Fall on same level from slipping, tripping and stumbling without subsequent striking against object, initial encounter: Secondary | ICD-10-CM | POA: Insufficient documentation

## 2018-08-21 DIAGNOSIS — S62316A Displaced fracture of base of fifth metacarpal bone, right hand, initial encounter for closed fracture: Secondary | ICD-10-CM

## 2018-08-21 HISTORY — PX: OPEN REDUCTION INTERNAL FIXATION (ORIF) METACARPAL: SHX6234

## 2018-08-21 SURGERY — OPEN REDUCTION INTERNAL FIXATION (ORIF) METACARPAL
Anesthesia: Monitor Anesthesia Care | Laterality: Right

## 2018-08-21 MED ORDER — FENTANYL CITRATE (PF) 100 MCG/2ML IJ SOLN
INTRAMUSCULAR | Status: AC
  Filled 2018-08-21: qty 2

## 2018-08-21 MED ORDER — PROPOFOL 500 MG/50ML IV EMUL
INTRAVENOUS | Status: DC | PRN
  Administered 2018-08-21: 70 ug/kg/min via INTRAVENOUS

## 2018-08-21 MED ORDER — FENTANYL CITRATE (PF) 100 MCG/2ML IJ SOLN: 25.0000 ug | INTRAMUSCULAR | Status: DC | PRN

## 2018-08-21 MED ORDER — LIDOCAINE HCL (PF) 1 % IJ SOLN
INTRAMUSCULAR | Status: AC
  Filled 2018-08-21: qty 30

## 2018-08-21 MED ORDER — ACETAMINOPHEN 325 MG PO TABS: 325.0000 mg | ORAL_TABLET | ORAL | Status: DC | PRN

## 2018-08-21 MED ORDER — SODIUM BICARBONATE 4.2 % IV SOLN
INTRAVENOUS | Status: AC
  Filled 2018-08-21: qty 10

## 2018-08-21 MED ORDER — MIDAZOLAM HCL 2 MG/2ML IJ SOLN: 1.0000 mg | INTRAMUSCULAR | Status: DC | PRN

## 2018-08-21 MED ORDER — PROPOFOL 10 MG/ML IV BOLUS
INTRAVENOUS | Status: AC
  Filled 2018-08-21: qty 40

## 2018-08-21 MED ORDER — HYDROCODONE-ACETAMINOPHEN 5-300 MG PO TABS: 1.0000 | ORAL_TABLET | Freq: Two times a day (BID) | ORAL | 0 refills | Status: AC | PRN

## 2018-08-21 MED ORDER — DEXAMETHASONE SODIUM PHOSPHATE 10 MG/ML IJ SOLN
INTRAMUSCULAR | Status: AC
  Filled 2018-08-21: qty 1

## 2018-08-21 MED ORDER — OXYCODONE HCL 5 MG/5ML PO SOLN: 5.0000 mg | Freq: Once | ORAL | Status: DC | PRN

## 2018-08-21 MED ORDER — BUPIVACAINE HCL (PF) 0.25 % IJ SOLN
INTRAMUSCULAR | Status: AC
  Filled 2018-08-21: qty 30

## 2018-08-21 MED ORDER — ONDANSETRON HCL 4 MG/2ML IJ SOLN
INTRAMUSCULAR | Status: DC | PRN
  Administered 2018-08-21: 4 mg via INTRAVENOUS

## 2018-08-21 MED ORDER — BUPIVACAINE HCL (PF) 0.5 % IJ SOLN
INTRAMUSCULAR | Status: AC
  Filled 2018-08-21: qty 30

## 2018-08-21 MED ORDER — CHLORHEXIDINE GLUCONATE 4 % EX LIQD: 60.0000 mL | Freq: Once | CUTANEOUS | Status: DC

## 2018-08-21 MED ORDER — MIDAZOLAM HCL 2 MG/2ML IJ SOLN
INTRAMUSCULAR | Status: AC
  Filled 2018-08-21: qty 2

## 2018-08-21 MED ORDER — ACETAMINOPHEN 160 MG/5ML PO SOLN: 325.0000 mg | ORAL | Status: DC | PRN

## 2018-08-21 MED ORDER — MEPERIDINE HCL 25 MG/ML IJ SOLN: 6.2500 mg | INTRAMUSCULAR | Status: DC | PRN

## 2018-08-21 MED ORDER — CLINDAMYCIN PHOSPHATE 900 MG/50ML IV SOLN
INTRAVENOUS | Status: AC
  Filled 2018-08-21: qty 50

## 2018-08-21 MED ORDER — FENTANYL CITRATE (PF) 100 MCG/2ML IJ SOLN
50.0000 ug | INTRAMUSCULAR | Status: DC | PRN
  Administered 2018-08-21: 100 ug via INTRAVENOUS

## 2018-08-21 MED ORDER — ONDANSETRON HCL 4 MG/2ML IJ SOLN
INTRAMUSCULAR | Status: AC
  Filled 2018-08-21: qty 2

## 2018-08-21 MED ORDER — BUPIVACAINE LIPOSOME 1.3 % IJ SUSP
INTRAMUSCULAR | Status: DC | PRN
  Administered 2018-08-21: 10 mL via PERINEURAL

## 2018-08-21 MED ORDER — OXYCODONE HCL 5 MG PO TABS: 5.0000 mg | ORAL_TABLET | Freq: Once | ORAL | Status: DC | PRN

## 2018-08-21 MED ORDER — ONDANSETRON HCL 4 MG/2ML IJ SOLN: 4.0000 mg | Freq: Once | INTRAMUSCULAR | Status: DC | PRN

## 2018-08-21 MED ORDER — CLINDAMYCIN PHOSPHATE 900 MG/50ML IV SOLN: 900.0000 mg | INTRAVENOUS | Status: DC

## 2018-08-21 MED ORDER — BUPIVACAINE-EPINEPHRINE (PF) 0.5% -1:200000 IJ SOLN
INTRAMUSCULAR | Status: DC | PRN
  Administered 2018-08-21: 15 mL via PERINEURAL

## 2018-08-21 MED ORDER — LACTATED RINGERS IV SOLN
INTRAVENOUS | Status: DC
  Administered 2018-08-21: 08:00:00 via INTRAVENOUS

## 2018-08-21 MED ORDER — SCOPOLAMINE 1 MG/3DAYS TD PT72: 1.0000 | MEDICATED_PATCH | Freq: Once | TRANSDERMAL | Status: DC | PRN

## 2018-08-21 MED ORDER — LIDOCAINE 2% (20 MG/ML) 5 ML SYRINGE
INTRAMUSCULAR | Status: AC
  Filled 2018-08-21: qty 5

## 2018-08-21 SURGICAL SUPPLY — 68 items
APL SKNCLS STERI-STRIP NONHPOA (GAUZE/BANDAGES/DRESSINGS)
BANDAGE ACE 3X5.8 VEL STRL LF (GAUZE/BANDAGES/DRESSINGS) ×1 IMPLANT
BENZOIN TINCTURE PRP APPL 2/3 (GAUZE/BANDAGES/DRESSINGS) IMPLANT
BIT DRILL 1.1 (BIT) ×2
BIT DRILL 60X20X1.1XQC TMX (BIT) IMPLANT
BIT DRL 60X20X1.1XQC TMX (BIT) ×1
BLADE SURG 15 STRL LF DISP TIS (BLADE) ×2 IMPLANT
BLADE SURG 15 STRL SS (BLADE) ×2
BNDG CMPR 9X4 STRL LF SNTH (GAUZE/BANDAGES/DRESSINGS) ×1
BNDG CONFORM 3 STRL LF (GAUZE/BANDAGES/DRESSINGS) ×1 IMPLANT
BNDG ELASTIC 2X5.8 VLCR STR LF (GAUZE/BANDAGES/DRESSINGS) ×1 IMPLANT
BNDG ESMARK 4X9 LF (GAUZE/BANDAGES/DRESSINGS) ×2 IMPLANT
CANISTER SUCT 1200ML W/VALVE (MISCELLANEOUS) IMPLANT
CORD BIPOLAR FORCEPS 12FT (ELECTRODE) ×2 IMPLANT
COVER BACK TABLE REUSABLE LG (DRAPES) ×2 IMPLANT
COVER WAND RF STERILE (DRAPES) IMPLANT
CUFF TOURN SGL QUICK 18X4 (TOURNIQUET CUFF) ×1 IMPLANT
DECANTER SPIKE VIAL GLASS SM (MISCELLANEOUS) IMPLANT
DRAPE EXTREMITY T 121X128X90 (DISPOSABLE) ×2 IMPLANT
DRAPE OEC MINIVIEW 54X84 (DRAPES) ×2 IMPLANT
DRAPE SURG 17X23 STRL (DRAPES) ×2 IMPLANT
DRSG EMULSION OIL 3X3 NADH (GAUZE/BANDAGES/DRESSINGS) ×2 IMPLANT
GAUZE 4X4 16PLY RFD (DISPOSABLE) IMPLANT
GAUZE SPONGE 4X4 12PLY STRL (GAUZE/BANDAGES/DRESSINGS) ×1 IMPLANT
GAUZE XEROFORM 1X8 LF (GAUZE/BANDAGES/DRESSINGS) IMPLANT
GLOVE BIO SURGEON STRL SZ 6.5 (GLOVE) ×1 IMPLANT
GLOVE BIO SURGEON STRL SZ8 (GLOVE) ×2 IMPLANT
GLOVE BIOGEL PI IND STRL 6.5 (GLOVE) IMPLANT
GLOVE BIOGEL PI IND STRL 8.5 (GLOVE) ×1 IMPLANT
GLOVE BIOGEL PI INDICATOR 6.5 (GLOVE) ×2
GLOVE BIOGEL PI INDICATOR 8.5 (GLOVE) ×1
GOWN STRL REUS W/ TWL LRG LVL3 (GOWN DISPOSABLE) ×1 IMPLANT
GOWN STRL REUS W/TWL LRG LVL3 (GOWN DISPOSABLE) ×6
K-WIRE DBL TRONS .035X6 (WIRE) ×2
KWIRE DBL TRONS .035X6 (WIRE) IMPLANT
NDL HYPO 25X1 1.5 SAFETY (NEEDLE) IMPLANT
NEEDLE HYPO 25X1 1.5 SAFETY (NEEDLE) IMPLANT
NS IRRIG 1000ML POUR BTL (IV SOLUTION) ×2 IMPLANT
PACK BASIN DAY SURGERY FS (CUSTOM PROCEDURE TRAY) ×2 IMPLANT
PAD CAST 3X4 CTTN HI CHSV (CAST SUPPLIES) ×1 IMPLANT
PADDING CAST ABS 3INX4YD NS (CAST SUPPLIES) ×1
PADDING CAST ABS COTTON 3X4 (CAST SUPPLIES) ×1 IMPLANT
PADDING CAST COTTON 3X4 STRL (CAST SUPPLIES) ×2
PADDING UNDERCAST 2 STRL (CAST SUPPLIES) ×1
PADDING UNDERCAST 2X4 STRL (CAST SUPPLIES) IMPLANT
PLATE STRAIGHT LOCK 1.5 (Plate) ×1 IMPLANT
SCREW L 1.5X12 (Screw) ×1 IMPLANT
SCREW LOCKING 1.5X13MM (Screw) ×1 IMPLANT
SCREW NL 1.5X11 WRIST (Screw) ×2 IMPLANT
SCREW NL 1.5X12 (Screw) ×1 IMPLANT
SCREW NONIOC 1.5 10M (Screw) ×3 IMPLANT
SHEET MEDIUM DRAPE 40X70 STRL (DRAPES) ×1 IMPLANT
SLING ARM FOAM STRAP LRG (SOFTGOODS) ×1 IMPLANT
SPLINT FIBERGLASS 3X35 (CAST SUPPLIES) ×1 IMPLANT
STOCKINETTE 4X48 STRL (DRAPES) ×2 IMPLANT
STRIP CLOSURE SKIN 1/2X4 (GAUZE/BANDAGES/DRESSINGS) ×1 IMPLANT
SUCTION FRAZIER HANDLE 10FR (MISCELLANEOUS) ×1
SUCTION TUBE FRAZIER 10FR DISP (MISCELLANEOUS) IMPLANT
SUT MNCRL AB 3-0 PS2 18 (SUTURE) ×2 IMPLANT
SUT MON AB 4-0 PC3 18 (SUTURE) ×1 IMPLANT
SUT PROLENE 4 0 PS 2 18 (SUTURE) ×1 IMPLANT
SUT VIC AB 3-0 FS2 27 (SUTURE) ×2 IMPLANT
SYR BULB 3OZ (MISCELLANEOUS) ×2 IMPLANT
SYR CONTROL 10ML LL (SYRINGE) IMPLANT
TOWEL GREEN STERILE FF (TOWEL DISPOSABLE) ×4 IMPLANT
TRAY DSU PREP LF (CUSTOM PROCEDURE TRAY) ×2 IMPLANT
TUBE CONNECTING 20X1/4 (TUBING) ×1 IMPLANT
UNDERPAD 30X30 (UNDERPADS AND DIAPERS) ×2 IMPLANT

## 2018-08-21 NOTE — Anesthesia Preprocedure Evaluation (Signed)
Anesthesia Evaluation  Patient identified by MRN, date of birth, ID band Patient awake    Reviewed: Allergy & Precautions, NPO status , Patient's Chart, lab work & pertinent test results  History of Anesthesia Complications (+) PONV and history of anesthetic complications  Airway Mallampati: II  TM Distance: >3 FB Neck ROM: Full    Dental  (+) Dental Advisory Given   Pulmonary former smoker,    breath sounds clear to auscultation       Cardiovascular + DVT   Rhythm:Regular Rate:Normal     Neuro/Psych PSYCHIATRIC DISORDERS Depression negative neurological ROS     GI/Hepatic Neg liver ROS, GERD  ,  Endo/Other  Hypothyroidism     Renal/GU negative Renal ROS     Musculoskeletal  (+) Arthritis , Osteoarthritis,   Psoriatic arthritis    Abdominal   Peds  Hematology  (+) Blood dyscrasia (on xarelto), ,   Anesthesia Other Findings   Reproductive/Obstetrics                             Lab Results  Component Value Date   WBC 6.0 04/26/2018   HGB 14.2 04/26/2018   HCT 44.6 04/26/2018   MCV 100.9 (H) 04/26/2018   PLT 205 04/26/2018   Lab Results  Component Value Date   CREATININE 0.72 04/26/2018   BUN 11 04/26/2018   NA 138 04/26/2018   K 3.5 04/26/2018   CL 104 04/26/2018   CO2 26 04/26/2018    Anesthesia Physical  Anesthesia Plan  ASA: II  Anesthesia Plan: MAC   Post-op Pain Management: GA combined w/ Regional for post-op pain   Induction: Intravenous  PONV Risk Score and Plan: 4 or greater and Treatment may vary due to age or medical condition, Ondansetron, Dexamethasone and Propofol infusion  Airway Management Planned: Mask and Natural Airway  Additional Equipment: None  Intra-op Plan:   Post-operative Plan: Extubation in OR  Informed Consent: I have reviewed the patients History and Physical, chart, labs and discussed the procedure including the risks, benefits  and alternatives for the proposed anesthesia with the patient or authorized representative who has indicated his/her understanding and acceptance.     Dental advisory given  Plan Discussed with: CRNA, Anesthesiologist and Surgeon  Anesthesia Plan Comments:         Anesthesia Quick Evaluation

## 2018-08-21 NOTE — Anesthesia Procedure Notes (Signed)
Anesthesia Regional Block: Ankle block   Pre-Anesthetic Checklist: ,, timeout performed, Correct Patient, Correct Site, Correct Laterality, Correct Procedure, Correct Position, site marked, Risks and benefits discussed,  Surgical consent,  Pre-op evaluation,  At surgeon's request and post-op pain management  Laterality: Right  Prep: chloraprep       Needles:  Injection technique: Single-shot  Needle Type: Echogenic Stimulator Needle     Needle Length: 5cm  Needle Gauge: 22     Additional Needles:   Procedures:, nerve stimulator,,, ultrasound used (permanent image in chart),,,,   Nerve Stimulator or Paresthesia:  Response: hand, 0.45 mA,   Additional Responses:   Narrative:  Start time: 08/21/2018 7:20 AM End time: 08/21/2018 7:25 AM Injection made incrementally with aspirations every 5 mL.  Performed by: Personally  Anesthesiologist: Janeece Riggers, MD  Additional Notes: Functioning IV was confirmed and monitors were applied.  A 25mm 22ga Arrow echogenic stimulator needle was used. Sterile prep and drape,hand hygiene and sterile gloves were used. Ultrasound guidance: relevant anatomy identified, needle position confirmed, local anesthetic spread visualized around nerve(s)., vascular puncture avoided.  Image printed for medical record. Negative aspiration and negative test dose prior to incremental administration of local anesthetic. The patient tolerated the procedure well.

## 2018-08-21 NOTE — Op Note (Signed)
PREOPERATIVE DIAGNOSIS: Right small finger displaced metacarpal shaft fracture  POSTOPERATIVE DIAGNOSIS: Same  ATTENDING SURGEON: Dr. Iran Planas who is scrubbed and present for the entire procedure  ASSISTANT SURGEON: Gertie Fey, PA-C was scrubbed and necessary for the entire procedure to help to aid in internal fixation reduction closure and splinting in a timely fashion  ANESTHESIA: Regional block with IV sedation  OPERATIVE PROCEDURE: #1: Open treatment of right small finger metacarpal shaft fracture requiring internal fixation #2: Radiographs 3 views right hand  IMPLANTS: Biomet 7-hole plate 1.5 millimeter screws locking and nonlocking  RADIOGRAPHIC INTERPRETATION: AP lateral oblique views of the hand do show the dorsal plate fixation in good position  SURGICAL INDICATIONS: Patient is a right-hand-dominant female who sustained a closed small finger metacarpal shaft fracture.  Patient was seen evaluate in the office and recommended undergo the above procedure.  Risk benefits and alternatives were discussed in detail with the patient and a signed informed consent was obtained.  Risks include but not limited to bleeding infection damage nearby nerves arteries or tendons nonunion malunion hardware failure loss of motion of the wrist and digits incomplete relief of symptoms and need for further surgical intervention.  SURGICAL TECHNIQUE: Patient was palpated and found the preoperative holding area marked with a permanent marker made on the right hand indicate the correct operative site.  Patient brought back to operating room placed supine on the anesthesia table where the regional anesthetic was administered.  Patient tolerated this well.  Preoperative antibiotics were given prior to skin incision.  A well-padded tourniquet was then placed on the right brachium and sealed with the appropriate drape.  The right upper extremities then prepped and draped normal sterile fashion.  A timeout was  called the correct site was identified the procedure then begun.  Attention then turned to the right hand.  Close manipulation was unsuccessful therefore the limb was then elevated and the tourniquet insufflated.  A longitudinal incision made directly over the small finger metacarpal shaft.  Dissection carried down through the skin and subcutaneous tissue.  Subcutaneous veins were cauterized with the bipolar cautery.  The interval between the extensor tendons was then developed protecting the extensor tendons to the small finger.  The fascial layer over the bone was incised longitudinally to expose the fracture site.  Open reduction was then performed.  This was held in place with a reduction clamp.  Following this given the poor bone quality plate fixation was recommended for stabilization and not K wires and screws only.  Plate fixation was then applied held in place with K wires and position confirmed using the mini C arm.  Once plate position was then confirmed screw fixation was then carried out with a combination of locking nonlocking screws.  These were 1.5 millimeter screws.  The wound was then thoroughly irrigated.  Final radiographs were then obtained.  Fascial layer was then closed with 3-0 Vicryl.  The subcutaneous tissues closed with 4-0 Monocryl and the skin was then closed using simple Prolene sutures.  Tourniquet deflated there is hemostasis obtained Adaptic dressing a sterile compressive bandage then applied.  The patient was then placed in a well-padded ulnar gutter splint taken recovery room in good condition.    POSTOPERATIVE PLAN: Patient be discharged to home.  See him back in the office in 8 days for wound check suture check down to see her therapist for an ulnar gutter splint to include the wrist begin a postoperative regimen for ORIF of the small finger metacarpal  with plate and screw construct.  See him back at the 2-week mark for suture removal.  Radiographs at each visit.

## 2018-08-21 NOTE — Transfer of Care (Signed)
Immediate Anesthesia Transfer of Care Note  Patient: Ashley Griffith  Procedure(s) Performed: OPEN REDUCTION INTERNAL FIXATION (ORIF) AND REPAIR AS INDICATED  RIGHT 5TH METACARPAL (Right )  Patient Location: PACU  Anesthesia Type:MAC and Regional  Level of Consciousness: awake, alert  and oriented  Airway & Oxygen Therapy: Patient Spontanous Breathing and Patient connected to face mask oxygen  Post-op Assessment: Report given to RN and Post -op Vital signs reviewed and stable  Post vital signs: Reviewed and stable  Last Vitals:  Vitals Value Taken Time  BP    Temp    Pulse    Resp 26 08/21/2018  8:52 AM  SpO2    Vitals shown include unvalidated device data.  Last Pain:  Vitals:   08/21/18 0652  TempSrc: Oral  PainSc: 2          Complications: No apparent anesthesia complications

## 2018-08-21 NOTE — Discharge Instructions (Signed)
Post Anesthesia Home Care Instructions  Activity: Get plenty of rest for the remainder of the day. A responsible individual must stay with you for 24 hours following the procedure.  For the next 24 hours, DO NOT: -Drive a car -Paediatric nurse -Drink alcoholic beverages -Take any medication unless instructed by your physician -Make any legal decisions or sign important papers.  Meals: Start with liquid foods such as gelatin or soup. Progress to regular foods as tolerated. Avoid greasy, spicy, heavy foods. If nausea and/or vomiting occur, drink only clear liquids until the nausea and/or vomiting subsides. Call your physician if vomiting continues.  Special Instructions/Symptoms: Your throat may feel dry or sore from the anesthesia or the breathing tube placed in your throat during surgery. If this causes discomfort, gargle with warm salt water. The discomfort should disappear within 24 hours.  If you had a scopolamine patch placed behind your ear for the management of post- operative nausea and/or vomiting:  1. The medication in the patch is effective for 72 hours, after which it should be removed.  Wrap patch in a tissue and discard in the trash. Wash hands thoroughly with soap and water. 2. You may remove the patch earlier than 72 hours if you experience unpleasant side effects which may include dry mouth, dizziness or visual disturbances. 3. Avoid touching the patch. Wash your hands with soap and water after contact with the patch.       Regional Anesthesia Blocks  1. Numbness or the inability to move the "blocked" extremity may last from 3-48 hours after placement. The length of time depends on the medication injected and your individual response to the medication. If the numbness is not going away after 48 hours, call your surgeon.  2. The extremity that is blocked will need to be protected until the numbness is gone and the  Strength has returned. Because you cannot feel it, you  will need to take extra care to avoid injury. Because it may be weak, you may have difficulty moving it or using it. You may not know what position it is in without looking at it while the block is in effect.  3. For blocks in the legs and feet, returning to weight bearing and walking needs to be done carefully. You will need to wait until the numbness is entirely gone and the strength has returned. You should be able to move your leg and foot normally before you try and bear weight or walk. You will need someone to be with you when you first try to ensure you do not fall and possibly risk injury.  4. Bruising and tenderness at the needle site are common side effects and will resolve in a few days.  5. Persistent numbness or new problems with movement should be communicated to the surgeon or the [place] [phone] [place] [phone]).   Information for Discharge Teaching: EXPAREL (bupivacaine liposome injectable suspension)   Your surgeon or anesthesiologist gave you EXPAREL(bupivacaine) to help control your pain after surgery.   EXPAREL is a local anesthetic that provides pain relief by numbing the tissue around the surgical site.  EXPAREL is designed to release pain medication over time and can control pain for up to 72 hours.  Depending on how you respond to EXPAREL, you may require less pain medication during your recovery.  Possible side effects:  Temporary loss of sensation or ability to move in the area where bupivacaine was injected.  Nausea, vomiting, constipation  Rarely, numbness and tingling in your mouth or lips, lightheadedness, or anxiety may occur.  Call your doctor right away if you think you may be experiencing any of these sensations, or if you have other questions regarding possible side effects.  Follow all other discharge instructions given to you by your surgeon or nurse. Eat a healthy diet and drink plenty of water or  other fluids.  If you return to the hospital for any reason within 96 hours following the administration of EXPAREL, it is important for health care providers to know that you have received this anesthetic. A teal colored band has been placed on your arm with the date, time and amount of EXPAREL you have received in order to alert and inform your health care providers. Please leave this armband in place for the full 96 hours following administration, and then you may remove the band.   KEEP BANDAGE CLEAN AND DRY CALL OFFICE FOR F/U APPT [phone] in 8 days Also CALL FOR THERAPY APPT IN 8 DAYS TO FOLLOW DR. APPT KEEP HAND ELEVATED ABOVE HEART OK TO APPLY ICE TO OPERATIVE AREA CONTACT OFFICE IF ANY WORSENING PAIN OR CONCERNS.

## 2018-08-21 NOTE — Progress Notes (Signed)
Assisted Dr. Oddono with right, ultrasound guided, axillary block. Side rails up, monitors on throughout procedure. See vital signs in flow sheet. Tolerated Procedure well. 

## 2018-08-22 ENCOUNTER — Encounter (HOSPITAL_BASED_OUTPATIENT_CLINIC_OR_DEPARTMENT_OTHER): Payer: Self-pay | Admitting: Orthopedic Surgery

## 2018-08-22 NOTE — Anesthesia Postprocedure Evaluation (Signed)
Anesthesia Post Note  Patient: Ashley Griffith  Procedure(s) Performed: OPEN REDUCTION INTERNAL FIXATION (ORIF) AND REPAIR AS INDICATED  RIGHT 5TH METACARPAL (Right )     Patient location during evaluation: PACU Anesthesia Type: MAC Level of consciousness: awake and alert Pain management: pain level controlled Vital Signs Assessment: post-procedure vital signs reviewed and stable Respiratory status: spontaneous breathing, nonlabored ventilation, respiratory function stable and patient connected to nasal cannula oxygen Cardiovascular status: stable and blood pressure returned to baseline Postop Assessment: no apparent nausea or vomiting Anesthetic complications: no    Last Vitals:  Vitals:   08/21/18 0916 08/21/18 1004  BP: 105/64 111/70  Pulse: (!) 58 (!) 57  Resp: 16 20  Temp:  36.4 C  SpO2: 98% 100%    Last Pain:  Vitals:   08/21/18 1004  TempSrc: Oral  PainSc: 0-No pain   Pain Goal:                   Kamoni Depree

## 2018-08-27 ENCOUNTER — Encounter (HOSPITAL_BASED_OUTPATIENT_CLINIC_OR_DEPARTMENT_OTHER): Payer: Self-pay | Admitting: Orthopedic Surgery

## 2018-08-28 ENCOUNTER — Encounter (HOSPITAL_BASED_OUTPATIENT_CLINIC_OR_DEPARTMENT_OTHER): Payer: Self-pay | Admitting: Orthopedic Surgery

## 2018-08-29 DIAGNOSIS — M79644 Pain in right finger(s): Secondary | ICD-10-CM | POA: Diagnosis not present

## 2018-08-29 DIAGNOSIS — S62326A Displaced fracture of shaft of fifth metacarpal bone, right hand, initial encounter for closed fracture: Secondary | ICD-10-CM | POA: Diagnosis not present

## 2018-08-29 DIAGNOSIS — Z4789 Encounter for other orthopedic aftercare: Secondary | ICD-10-CM | POA: Diagnosis not present

## 2018-08-29 DIAGNOSIS — S62326D Displaced fracture of shaft of fifth metacarpal bone, right hand, subsequent encounter for fracture with routine healing: Secondary | ICD-10-CM | POA: Diagnosis not present

## 2018-09-04 DIAGNOSIS — M79644 Pain in right finger(s): Secondary | ICD-10-CM | POA: Diagnosis not present

## 2018-09-12 DIAGNOSIS — S62326A Displaced fracture of shaft of fifth metacarpal bone, right hand, initial encounter for closed fracture: Secondary | ICD-10-CM | POA: Diagnosis not present

## 2018-09-12 DIAGNOSIS — S62326D Displaced fracture of shaft of fifth metacarpal bone, right hand, subsequent encounter for fracture with routine healing: Secondary | ICD-10-CM | POA: Diagnosis not present

## 2018-09-12 DIAGNOSIS — M79644 Pain in right finger(s): Secondary | ICD-10-CM | POA: Diagnosis not present

## 2018-09-18 DIAGNOSIS — M79644 Pain in right finger(s): Secondary | ICD-10-CM | POA: Diagnosis not present

## 2018-09-19 DIAGNOSIS — L408 Other psoriasis: Secondary | ICD-10-CM | POA: Diagnosis not present

## 2018-09-19 DIAGNOSIS — M542 Cervicalgia: Secondary | ICD-10-CM | POA: Diagnosis not present

## 2018-09-19 DIAGNOSIS — M1710 Unilateral primary osteoarthritis, unspecified knee: Secondary | ICD-10-CM | POA: Diagnosis not present

## 2018-09-19 DIAGNOSIS — L4050 Arthropathic psoriasis, unspecified: Secondary | ICD-10-CM | POA: Diagnosis not present

## 2018-09-19 DIAGNOSIS — Z79899 Other long term (current) drug therapy: Secondary | ICD-10-CM | POA: Diagnosis not present

## 2018-09-19 DIAGNOSIS — E559 Vitamin D deficiency, unspecified: Secondary | ICD-10-CM | POA: Diagnosis not present

## 2018-09-20 DIAGNOSIS — E039 Hypothyroidism, unspecified: Secondary | ICD-10-CM | POA: Diagnosis not present

## 2018-09-20 DIAGNOSIS — K219 Gastro-esophageal reflux disease without esophagitis: Secondary | ICD-10-CM | POA: Diagnosis not present

## 2018-09-20 DIAGNOSIS — M1990 Unspecified osteoarthritis, unspecified site: Secondary | ICD-10-CM | POA: Diagnosis not present

## 2018-09-20 DIAGNOSIS — F419 Anxiety disorder, unspecified: Secondary | ICD-10-CM | POA: Diagnosis not present

## 2018-09-20 DIAGNOSIS — R69 Illness, unspecified: Secondary | ICD-10-CM | POA: Diagnosis not present

## 2018-09-20 DIAGNOSIS — E785 Hyperlipidemia, unspecified: Secondary | ICD-10-CM | POA: Diagnosis not present

## 2018-09-20 DIAGNOSIS — G8929 Other chronic pain: Secondary | ICD-10-CM | POA: Diagnosis not present

## 2018-09-20 DIAGNOSIS — Z7722 Contact with and (suspected) exposure to environmental tobacco smoke (acute) (chronic): Secondary | ICD-10-CM | POA: Diagnosis not present

## 2018-09-20 DIAGNOSIS — L4050 Arthropathic psoriasis, unspecified: Secondary | ICD-10-CM | POA: Diagnosis not present

## 2018-09-20 DIAGNOSIS — H04129 Dry eye syndrome of unspecified lacrimal gland: Secondary | ICD-10-CM | POA: Diagnosis not present

## 2018-09-25 DIAGNOSIS — M545 Low back pain: Secondary | ICD-10-CM | POA: Diagnosis not present

## 2018-09-25 DIAGNOSIS — M79644 Pain in right finger(s): Secondary | ICD-10-CM | POA: Diagnosis not present

## 2018-09-25 DIAGNOSIS — Z6828 Body mass index (BMI) 28.0-28.9, adult: Secondary | ICD-10-CM | POA: Diagnosis not present

## 2018-09-25 DIAGNOSIS — M48061 Spinal stenosis, lumbar region without neurogenic claudication: Secondary | ICD-10-CM | POA: Diagnosis not present

## 2018-09-25 DIAGNOSIS — M4316 Spondylolisthesis, lumbar region: Secondary | ICD-10-CM | POA: Diagnosis not present

## 2018-09-25 DIAGNOSIS — M7130 Other bursal cyst, unspecified site: Secondary | ICD-10-CM | POA: Diagnosis not present

## 2018-09-25 DIAGNOSIS — I10 Essential (primary) hypertension: Secondary | ICD-10-CM | POA: Diagnosis not present

## 2018-09-26 DIAGNOSIS — M7121 Synovial cyst of popliteal space [Baker], right knee: Secondary | ICD-10-CM | POA: Diagnosis not present

## 2018-09-26 DIAGNOSIS — M170 Bilateral primary osteoarthritis of knee: Secondary | ICD-10-CM | POA: Diagnosis not present

## 2018-10-02 DIAGNOSIS — M79644 Pain in right finger(s): Secondary | ICD-10-CM | POA: Diagnosis not present

## 2018-10-03 DIAGNOSIS — Z4789 Encounter for other orthopedic aftercare: Secondary | ICD-10-CM | POA: Diagnosis not present

## 2018-10-03 DIAGNOSIS — S62326A Displaced fracture of shaft of fifth metacarpal bone, right hand, initial encounter for closed fracture: Secondary | ICD-10-CM | POA: Diagnosis not present

## 2018-10-03 DIAGNOSIS — H524 Presbyopia: Secondary | ICD-10-CM | POA: Diagnosis not present

## 2018-10-03 DIAGNOSIS — Z961 Presence of intraocular lens: Secondary | ICD-10-CM | POA: Diagnosis not present

## 2018-10-03 DIAGNOSIS — H04123 Dry eye syndrome of bilateral lacrimal glands: Secondary | ICD-10-CM | POA: Diagnosis not present

## 2018-10-03 DIAGNOSIS — S62326D Displaced fracture of shaft of fifth metacarpal bone, right hand, subsequent encounter for fracture with routine healing: Secondary | ICD-10-CM | POA: Diagnosis not present

## 2018-10-08 DIAGNOSIS — R2689 Other abnormalities of gait and mobility: Secondary | ICD-10-CM | POA: Diagnosis not present

## 2018-10-09 DIAGNOSIS — Z0000 Encounter for general adult medical examination without abnormal findings: Secondary | ICD-10-CM | POA: Diagnosis not present

## 2018-10-14 DIAGNOSIS — Z86711 Personal history of pulmonary embolism: Secondary | ICD-10-CM | POA: Diagnosis not present

## 2018-10-14 DIAGNOSIS — R69 Illness, unspecified: Secondary | ICD-10-CM | POA: Diagnosis not present

## 2018-10-14 DIAGNOSIS — Z7901 Long term (current) use of anticoagulants: Secondary | ICD-10-CM | POA: Diagnosis not present

## 2018-10-14 DIAGNOSIS — L4050 Arthropathic psoriasis, unspecified: Secondary | ICD-10-CM | POA: Diagnosis not present

## 2018-10-14 DIAGNOSIS — E039 Hypothyroidism, unspecified: Secondary | ICD-10-CM | POA: Diagnosis not present

## 2018-10-14 DIAGNOSIS — R2689 Other abnormalities of gait and mobility: Secondary | ICD-10-CM | POA: Diagnosis not present

## 2018-10-16 DIAGNOSIS — M1711 Unilateral primary osteoarthritis, right knee: Secondary | ICD-10-CM | POA: Diagnosis not present

## 2018-10-21 DIAGNOSIS — Z1212 Encounter for screening for malignant neoplasm of rectum: Secondary | ICD-10-CM | POA: Diagnosis not present

## 2018-10-21 DIAGNOSIS — Z1211 Encounter for screening for malignant neoplasm of colon: Secondary | ICD-10-CM | POA: Diagnosis not present

## 2018-10-22 DIAGNOSIS — R2689 Other abnormalities of gait and mobility: Secondary | ICD-10-CM | POA: Diagnosis not present

## 2018-10-23 DIAGNOSIS — S76311D Strain of muscle, fascia and tendon of the posterior muscle group at thigh level, right thigh, subsequent encounter: Secondary | ICD-10-CM | POA: Diagnosis not present

## 2018-10-29 DIAGNOSIS — R2689 Other abnormalities of gait and mobility: Secondary | ICD-10-CM | POA: Diagnosis not present

## 2018-11-05 DIAGNOSIS — M545 Low back pain: Secondary | ICD-10-CM | POA: Diagnosis not present

## 2018-11-13 DIAGNOSIS — M5416 Radiculopathy, lumbar region: Secondary | ICD-10-CM | POA: Diagnosis not present

## 2018-11-19 DIAGNOSIS — M4856XD Collapsed vertebra, not elsewhere classified, lumbar region, subsequent encounter for fracture with routine healing: Secondary | ICD-10-CM | POA: Diagnosis not present

## 2018-11-21 DIAGNOSIS — Z1231 Encounter for screening mammogram for malignant neoplasm of breast: Secondary | ICD-10-CM | POA: Diagnosis not present

## 2018-11-24 DIAGNOSIS — S63611A Unspecified sprain of left index finger, initial encounter: Secondary | ICD-10-CM | POA: Diagnosis not present

## 2018-11-27 DIAGNOSIS — M4316 Spondylolisthesis, lumbar region: Secondary | ICD-10-CM | POA: Diagnosis not present

## 2018-11-27 DIAGNOSIS — M48061 Spinal stenosis, lumbar region without neurogenic claudication: Secondary | ICD-10-CM | POA: Diagnosis not present

## 2018-11-27 DIAGNOSIS — M545 Low back pain: Secondary | ICD-10-CM | POA: Diagnosis not present

## 2018-11-27 DIAGNOSIS — S32020D Wedge compression fracture of second lumbar vertebra, subsequent encounter for fracture with routine healing: Secondary | ICD-10-CM | POA: Diagnosis not present

## 2018-11-27 DIAGNOSIS — M549 Dorsalgia, unspecified: Secondary | ICD-10-CM | POA: Diagnosis not present

## 2018-11-27 DIAGNOSIS — M5416 Radiculopathy, lumbar region: Secondary | ICD-10-CM | POA: Diagnosis not present

## 2018-11-28 DIAGNOSIS — M25561 Pain in right knee: Secondary | ICD-10-CM | POA: Diagnosis not present

## 2018-12-07 DIAGNOSIS — M1711 Unilateral primary osteoarthritis, right knee: Secondary | ICD-10-CM | POA: Diagnosis not present

## 2018-12-09 DIAGNOSIS — R2689 Other abnormalities of gait and mobility: Secondary | ICD-10-CM | POA: Diagnosis not present

## 2018-12-19 DIAGNOSIS — R2689 Other abnormalities of gait and mobility: Secondary | ICD-10-CM | POA: Diagnosis not present

## 2018-12-20 DIAGNOSIS — M25561 Pain in right knee: Secondary | ICD-10-CM | POA: Diagnosis not present

## 2018-12-20 DIAGNOSIS — M1711 Unilateral primary osteoarthritis, right knee: Secondary | ICD-10-CM | POA: Diagnosis not present

## 2018-12-23 DIAGNOSIS — E559 Vitamin D deficiency, unspecified: Secondary | ICD-10-CM | POA: Diagnosis not present

## 2018-12-23 DIAGNOSIS — M1710 Unilateral primary osteoarthritis, unspecified knee: Secondary | ICD-10-CM | POA: Diagnosis not present

## 2018-12-23 DIAGNOSIS — L408 Other psoriasis: Secondary | ICD-10-CM | POA: Diagnosis not present

## 2018-12-23 DIAGNOSIS — M79642 Pain in left hand: Secondary | ICD-10-CM | POA: Diagnosis not present

## 2018-12-23 DIAGNOSIS — E663 Overweight: Secondary | ICD-10-CM | POA: Diagnosis not present

## 2018-12-23 DIAGNOSIS — Z6827 Body mass index (BMI) 27.0-27.9, adult: Secondary | ICD-10-CM | POA: Diagnosis not present

## 2018-12-23 DIAGNOSIS — Z79899 Other long term (current) drug therapy: Secondary | ICD-10-CM | POA: Diagnosis not present

## 2018-12-23 DIAGNOSIS — L4050 Arthropathic psoriasis, unspecified: Secondary | ICD-10-CM | POA: Diagnosis not present

## 2018-12-23 DIAGNOSIS — M542 Cervicalgia: Secondary | ICD-10-CM | POA: Diagnosis not present

## 2019-01-01 DIAGNOSIS — M5416 Radiculopathy, lumbar region: Secondary | ICD-10-CM | POA: Diagnosis not present

## 2019-01-01 DIAGNOSIS — M4316 Spondylolisthesis, lumbar region: Secondary | ICD-10-CM | POA: Diagnosis not present

## 2019-01-01 DIAGNOSIS — S32020D Wedge compression fracture of second lumbar vertebra, subsequent encounter for fracture with routine healing: Secondary | ICD-10-CM | POA: Diagnosis not present

## 2019-01-01 DIAGNOSIS — M48061 Spinal stenosis, lumbar region without neurogenic claudication: Secondary | ICD-10-CM | POA: Diagnosis not present

## 2019-01-01 DIAGNOSIS — M545 Low back pain: Secondary | ICD-10-CM | POA: Diagnosis not present

## 2019-01-02 DIAGNOSIS — R2689 Other abnormalities of gait and mobility: Secondary | ICD-10-CM | POA: Diagnosis not present

## 2019-01-07 DIAGNOSIS — Z23 Encounter for immunization: Secondary | ICD-10-CM | POA: Diagnosis not present

## 2019-01-09 DIAGNOSIS — R2689 Other abnormalities of gait and mobility: Secondary | ICD-10-CM | POA: Diagnosis not present

## 2019-01-16 DIAGNOSIS — M545 Low back pain: Secondary | ICD-10-CM | POA: Diagnosis not present

## 2019-01-16 DIAGNOSIS — M21372 Foot drop, left foot: Secondary | ICD-10-CM | POA: Diagnosis not present

## 2019-01-17 DIAGNOSIS — M21372 Foot drop, left foot: Secondary | ICD-10-CM | POA: Diagnosis not present

## 2019-01-22 DIAGNOSIS — R2689 Other abnormalities of gait and mobility: Secondary | ICD-10-CM | POA: Diagnosis not present

## 2019-01-29 DIAGNOSIS — R2689 Other abnormalities of gait and mobility: Secondary | ICD-10-CM | POA: Diagnosis not present

## 2019-02-05 DIAGNOSIS — H0100B Unspecified blepharitis left eye, upper and lower eyelids: Secondary | ICD-10-CM | POA: Diagnosis not present

## 2019-02-05 DIAGNOSIS — R2689 Other abnormalities of gait and mobility: Secondary | ICD-10-CM | POA: Diagnosis not present

## 2019-02-12 DIAGNOSIS — R2689 Other abnormalities of gait and mobility: Secondary | ICD-10-CM | POA: Diagnosis not present

## 2019-02-19 DIAGNOSIS — R2689 Other abnormalities of gait and mobility: Secondary | ICD-10-CM | POA: Diagnosis not present

## 2019-03-03 DIAGNOSIS — M5416 Radiculopathy, lumbar region: Secondary | ICD-10-CM | POA: Diagnosis not present

## 2019-03-03 DIAGNOSIS — M4316 Spondylolisthesis, lumbar region: Secondary | ICD-10-CM | POA: Diagnosis not present

## 2019-03-03 DIAGNOSIS — M545 Low back pain: Secondary | ICD-10-CM | POA: Diagnosis not present

## 2019-03-03 DIAGNOSIS — M48061 Spinal stenosis, lumbar region without neurogenic claudication: Secondary | ICD-10-CM | POA: Diagnosis not present

## 2019-03-05 DIAGNOSIS — R2689 Other abnormalities of gait and mobility: Secondary | ICD-10-CM | POA: Diagnosis not present

## 2019-03-12 DIAGNOSIS — H10503 Unspecified blepharoconjunctivitis, bilateral: Secondary | ICD-10-CM | POA: Diagnosis not present

## 2019-03-14 DIAGNOSIS — M25561 Pain in right knee: Secondary | ICD-10-CM | POA: Diagnosis not present

## 2019-03-19 DIAGNOSIS — R2689 Other abnormalities of gait and mobility: Secondary | ICD-10-CM | POA: Diagnosis not present

## 2019-03-25 DIAGNOSIS — M25561 Pain in right knee: Secondary | ICD-10-CM | POA: Diagnosis not present

## 2019-03-25 DIAGNOSIS — M1711 Unilateral primary osteoarthritis, right knee: Secondary | ICD-10-CM | POA: Diagnosis not present

## 2019-03-25 DIAGNOSIS — M1712 Unilateral primary osteoarthritis, left knee: Secondary | ICD-10-CM | POA: Diagnosis not present

## 2019-03-25 DIAGNOSIS — M170 Bilateral primary osteoarthritis of knee: Secondary | ICD-10-CM | POA: Diagnosis not present

## 2019-03-27 DIAGNOSIS — L408 Other psoriasis: Secondary | ICD-10-CM | POA: Diagnosis not present

## 2019-03-27 DIAGNOSIS — E559 Vitamin D deficiency, unspecified: Secondary | ICD-10-CM | POA: Diagnosis not present

## 2019-03-27 DIAGNOSIS — Z79899 Other long term (current) drug therapy: Secondary | ICD-10-CM | POA: Diagnosis not present

## 2019-03-27 DIAGNOSIS — E663 Overweight: Secondary | ICD-10-CM | POA: Diagnosis not present

## 2019-03-27 DIAGNOSIS — M1710 Unilateral primary osteoarthritis, unspecified knee: Secondary | ICD-10-CM | POA: Diagnosis not present

## 2019-03-27 DIAGNOSIS — M542 Cervicalgia: Secondary | ICD-10-CM | POA: Diagnosis not present

## 2019-03-27 DIAGNOSIS — L4050 Arthropathic psoriasis, unspecified: Secondary | ICD-10-CM | POA: Diagnosis not present

## 2019-03-27 DIAGNOSIS — Z6827 Body mass index (BMI) 27.0-27.9, adult: Secondary | ICD-10-CM | POA: Diagnosis not present

## 2019-04-01 DIAGNOSIS — M25561 Pain in right knee: Secondary | ICD-10-CM | POA: Diagnosis not present

## 2019-04-01 DIAGNOSIS — M25562 Pain in left knee: Secondary | ICD-10-CM | POA: Diagnosis not present

## 2019-04-01 DIAGNOSIS — M170 Bilateral primary osteoarthritis of knee: Secondary | ICD-10-CM | POA: Diagnosis not present

## 2019-04-03 DIAGNOSIS — R2689 Other abnormalities of gait and mobility: Secondary | ICD-10-CM | POA: Diagnosis not present

## 2019-04-08 DIAGNOSIS — M170 Bilateral primary osteoarthritis of knee: Secondary | ICD-10-CM | POA: Diagnosis not present

## 2019-04-08 DIAGNOSIS — M1711 Unilateral primary osteoarthritis, right knee: Secondary | ICD-10-CM | POA: Diagnosis not present

## 2019-04-08 DIAGNOSIS — M1712 Unilateral primary osteoarthritis, left knee: Secondary | ICD-10-CM | POA: Diagnosis not present

## 2019-04-22 DIAGNOSIS — E039 Hypothyroidism, unspecified: Secondary | ICD-10-CM | POA: Diagnosis not present

## 2019-04-22 DIAGNOSIS — R69 Illness, unspecified: Secondary | ICD-10-CM | POA: Diagnosis not present

## 2019-04-22 DIAGNOSIS — M170 Bilateral primary osteoarthritis of knee: Secondary | ICD-10-CM | POA: Diagnosis not present

## 2019-04-22 DIAGNOSIS — E7800 Pure hypercholesterolemia, unspecified: Secondary | ICD-10-CM | POA: Diagnosis not present

## 2019-04-25 HISTORY — PX: OTHER SURGICAL HISTORY: SHX169

## 2019-05-05 DIAGNOSIS — E039 Hypothyroidism, unspecified: Secondary | ICD-10-CM | POA: Diagnosis not present

## 2019-05-05 DIAGNOSIS — R69 Illness, unspecified: Secondary | ICD-10-CM | POA: Diagnosis not present

## 2019-05-05 DIAGNOSIS — E7800 Pure hypercholesterolemia, unspecified: Secondary | ICD-10-CM | POA: Diagnosis not present

## 2019-05-05 DIAGNOSIS — L989 Disorder of the skin and subcutaneous tissue, unspecified: Secondary | ICD-10-CM | POA: Diagnosis not present

## 2019-05-05 DIAGNOSIS — Z7901 Long term (current) use of anticoagulants: Secondary | ICD-10-CM | POA: Diagnosis not present

## 2019-05-05 DIAGNOSIS — L4050 Arthropathic psoriasis, unspecified: Secondary | ICD-10-CM | POA: Diagnosis not present

## 2019-05-05 DIAGNOSIS — Z86711 Personal history of pulmonary embolism: Secondary | ICD-10-CM | POA: Diagnosis not present

## 2019-05-09 DIAGNOSIS — E7800 Pure hypercholesterolemia, unspecified: Secondary | ICD-10-CM | POA: Diagnosis not present

## 2019-05-09 DIAGNOSIS — R69 Illness, unspecified: Secondary | ICD-10-CM | POA: Diagnosis not present

## 2019-05-09 DIAGNOSIS — E0500 Thyrotoxicosis with diffuse goiter without thyrotoxic crisis or storm: Secondary | ICD-10-CM | POA: Diagnosis not present

## 2019-05-09 DIAGNOSIS — E039 Hypothyroidism, unspecified: Secondary | ICD-10-CM | POA: Diagnosis not present

## 2019-05-09 DIAGNOSIS — L4050 Arthropathic psoriasis, unspecified: Secondary | ICD-10-CM | POA: Diagnosis not present

## 2019-05-12 DIAGNOSIS — L980 Pyogenic granuloma: Secondary | ICD-10-CM | POA: Diagnosis not present

## 2019-05-12 DIAGNOSIS — D485 Neoplasm of uncertain behavior of skin: Secondary | ICD-10-CM | POA: Diagnosis not present

## 2019-05-12 DIAGNOSIS — I788 Other diseases of capillaries: Secondary | ICD-10-CM | POA: Diagnosis not present

## 2019-05-12 DIAGNOSIS — R21 Rash and other nonspecific skin eruption: Secondary | ICD-10-CM | POA: Diagnosis not present

## 2019-05-12 DIAGNOSIS — L821 Other seborrheic keratosis: Secondary | ICD-10-CM | POA: Diagnosis not present

## 2019-05-14 DIAGNOSIS — M1710 Unilateral primary osteoarthritis, unspecified knee: Secondary | ICD-10-CM | POA: Diagnosis not present

## 2019-05-14 DIAGNOSIS — L408 Other psoriasis: Secondary | ICD-10-CM | POA: Diagnosis not present

## 2019-05-14 DIAGNOSIS — M542 Cervicalgia: Secondary | ICD-10-CM | POA: Diagnosis not present

## 2019-05-14 DIAGNOSIS — E559 Vitamin D deficiency, unspecified: Secondary | ICD-10-CM | POA: Diagnosis not present

## 2019-05-14 DIAGNOSIS — M79641 Pain in right hand: Secondary | ICD-10-CM | POA: Diagnosis not present

## 2019-05-14 DIAGNOSIS — L4050 Arthropathic psoriasis, unspecified: Secondary | ICD-10-CM | POA: Diagnosis not present

## 2019-05-14 DIAGNOSIS — Z79899 Other long term (current) drug therapy: Secondary | ICD-10-CM | POA: Diagnosis not present

## 2019-05-19 DIAGNOSIS — H02883 Meibomian gland dysfunction of right eye, unspecified eyelid: Secondary | ICD-10-CM | POA: Diagnosis not present

## 2019-05-19 DIAGNOSIS — H02886 Meibomian gland dysfunction of left eye, unspecified eyelid: Secondary | ICD-10-CM | POA: Diagnosis not present

## 2019-05-19 DIAGNOSIS — L718 Other rosacea: Secondary | ICD-10-CM | POA: Diagnosis not present

## 2019-05-23 ENCOUNTER — Ambulatory Visit: Payer: Medicare HMO

## 2019-05-23 DIAGNOSIS — M170 Bilateral primary osteoarthritis of knee: Secondary | ICD-10-CM | POA: Diagnosis not present

## 2019-05-23 DIAGNOSIS — E7800 Pure hypercholesterolemia, unspecified: Secondary | ICD-10-CM | POA: Diagnosis not present

## 2019-05-23 DIAGNOSIS — R69 Illness, unspecified: Secondary | ICD-10-CM | POA: Diagnosis not present

## 2019-05-23 DIAGNOSIS — E039 Hypothyroidism, unspecified: Secondary | ICD-10-CM | POA: Diagnosis not present

## 2019-05-26 DIAGNOSIS — R69 Illness, unspecified: Secondary | ICD-10-CM | POA: Diagnosis not present

## 2019-05-26 DIAGNOSIS — G629 Polyneuropathy, unspecified: Secondary | ICD-10-CM | POA: Diagnosis not present

## 2019-05-26 DIAGNOSIS — F419 Anxiety disorder, unspecified: Secondary | ICD-10-CM | POA: Diagnosis not present

## 2019-05-26 DIAGNOSIS — Z008 Encounter for other general examination: Secondary | ICD-10-CM | POA: Diagnosis not present

## 2019-05-26 DIAGNOSIS — H01009 Unspecified blepharitis unspecified eye, unspecified eyelid: Secondary | ICD-10-CM | POA: Diagnosis not present

## 2019-05-26 DIAGNOSIS — I951 Orthostatic hypotension: Secondary | ICD-10-CM | POA: Diagnosis not present

## 2019-05-26 DIAGNOSIS — E039 Hypothyroidism, unspecified: Secondary | ICD-10-CM | POA: Diagnosis not present

## 2019-05-26 DIAGNOSIS — G8929 Other chronic pain: Secondary | ICD-10-CM | POA: Diagnosis not present

## 2019-05-26 DIAGNOSIS — E785 Hyperlipidemia, unspecified: Secondary | ICD-10-CM | POA: Diagnosis not present

## 2019-05-26 DIAGNOSIS — H04129 Dry eye syndrome of unspecified lacrimal gland: Secondary | ICD-10-CM | POA: Diagnosis not present

## 2019-05-26 DIAGNOSIS — L4050 Arthropathic psoriasis, unspecified: Secondary | ICD-10-CM | POA: Diagnosis not present

## 2019-05-31 ENCOUNTER — Ambulatory Visit: Payer: Medicare HMO | Attending: Internal Medicine

## 2019-05-31 DIAGNOSIS — Z23 Encounter for immunization: Secondary | ICD-10-CM | POA: Insufficient documentation

## 2019-05-31 NOTE — Progress Notes (Signed)
   Covid-19 Vaccination Clinic  Name:  EVVA GARMS    MRN: [id number] DOB: Jul 27, 1945  05/31/2019  Ms. Ketchum was observed post Covid-19 immunization for 15 minutes without incidence. She was provided with Vaccine Information Sheet and instruction to access the V-Safe system.   Ms. Helle was instructed to call 911 with any severe reactions post vaccine: Marland Kitchen Difficulty breathing  . Swelling of your face and throat  . A fast heartbeat  . A bad rash all over your body  . Dizziness and weakness    Immunizations Administered    Name Date Dose VIS Date Route   Pfizer COVID-19 Vaccine 05/31/2019  3:54 PM 0.3 mL 04/04/2019 Intramuscular   Manufacturer: [place]   Lot: [id number]   [place]: [id number]

## 2019-06-02 DIAGNOSIS — M545 Low back pain: Secondary | ICD-10-CM | POA: Diagnosis not present

## 2019-06-02 DIAGNOSIS — M4316 Spondylolisthesis, lumbar region: Secondary | ICD-10-CM | POA: Diagnosis not present

## 2019-06-02 DIAGNOSIS — R030 Elevated blood-pressure reading, without diagnosis of hypertension: Secondary | ICD-10-CM | POA: Diagnosis not present

## 2019-06-02 DIAGNOSIS — Z6827 Body mass index (BMI) 27.0-27.9, adult: Secondary | ICD-10-CM | POA: Diagnosis not present

## 2019-06-02 DIAGNOSIS — M5416 Radiculopathy, lumbar region: Secondary | ICD-10-CM | POA: Diagnosis not present

## 2019-06-03 ENCOUNTER — Ambulatory Visit: Payer: Medicare HMO

## 2019-06-16 DIAGNOSIS — E7800 Pure hypercholesterolemia, unspecified: Secondary | ICD-10-CM | POA: Diagnosis not present

## 2019-06-16 DIAGNOSIS — M170 Bilateral primary osteoarthritis of knee: Secondary | ICD-10-CM | POA: Diagnosis not present

## 2019-06-16 DIAGNOSIS — R69 Illness, unspecified: Secondary | ICD-10-CM | POA: Diagnosis not present

## 2019-06-16 DIAGNOSIS — E039 Hypothyroidism, unspecified: Secondary | ICD-10-CM | POA: Diagnosis not present

## 2019-06-20 DIAGNOSIS — H02883 Meibomian gland dysfunction of right eye, unspecified eyelid: Secondary | ICD-10-CM | POA: Diagnosis not present

## 2019-06-20 DIAGNOSIS — L718 Other rosacea: Secondary | ICD-10-CM | POA: Diagnosis not present

## 2019-06-20 DIAGNOSIS — H02886 Meibomian gland dysfunction of left eye, unspecified eyelid: Secondary | ICD-10-CM | POA: Diagnosis not present

## 2019-06-25 ENCOUNTER — Ambulatory Visit: Payer: Medicare HMO | Attending: Internal Medicine

## 2019-06-25 DIAGNOSIS — Z23 Encounter for immunization: Secondary | ICD-10-CM | POA: Insufficient documentation

## 2019-06-25 NOTE — Progress Notes (Signed)
   Covid-19 Vaccination Clinic  Name:  EVA LINQUIST    MRN: [id number] DOB: 1946/03/17  06/25/2019  Ms. Perlstein was observed post Covid-19 immunization for 15 minutes without incident. She was provided with Vaccine Information Sheet and instruction to access the V-Safe system.   Ms. Bertelli was instructed to call 911 with any severe reactions post vaccine: Marland Kitchen Difficulty breathing  . Swelling of face and throat  . A fast heartbeat  . A bad rash all over body  . Dizziness and weakness   Immunizations Administered    Name Date Dose VIS Date Route   Pfizer COVID-19 Vaccine 06/25/2019 12:27 PM 0.3 mL 04/04/2019 Intramuscular   Manufacturer: [place]   Lot: [id number]   [place]: [id number]

## 2019-06-26 DIAGNOSIS — L4050 Arthropathic psoriasis, unspecified: Secondary | ICD-10-CM | POA: Diagnosis not present

## 2019-06-26 DIAGNOSIS — M542 Cervicalgia: Secondary | ICD-10-CM | POA: Diagnosis not present

## 2019-06-26 DIAGNOSIS — E663 Overweight: Secondary | ICD-10-CM | POA: Diagnosis not present

## 2019-06-26 DIAGNOSIS — Z79899 Other long term (current) drug therapy: Secondary | ICD-10-CM | POA: Diagnosis not present

## 2019-06-26 DIAGNOSIS — L408 Other psoriasis: Secondary | ICD-10-CM | POA: Diagnosis not present

## 2019-06-26 DIAGNOSIS — M79641 Pain in right hand: Secondary | ICD-10-CM | POA: Diagnosis not present

## 2019-06-26 DIAGNOSIS — M1710 Unilateral primary osteoarthritis, unspecified knee: Secondary | ICD-10-CM | POA: Diagnosis not present

## 2019-06-26 DIAGNOSIS — Z6828 Body mass index (BMI) 28.0-28.9, adult: Secondary | ICD-10-CM | POA: Diagnosis not present

## 2019-06-26 DIAGNOSIS — M25511 Pain in right shoulder: Secondary | ICD-10-CM | POA: Diagnosis not present

## 2019-06-26 DIAGNOSIS — E559 Vitamin D deficiency, unspecified: Secondary | ICD-10-CM | POA: Diagnosis not present

## 2019-07-17 DIAGNOSIS — E039 Hypothyroidism, unspecified: Secondary | ICD-10-CM | POA: Diagnosis not present

## 2019-07-17 DIAGNOSIS — M170 Bilateral primary osteoarthritis of knee: Secondary | ICD-10-CM | POA: Diagnosis not present

## 2019-07-17 DIAGNOSIS — R69 Illness, unspecified: Secondary | ICD-10-CM | POA: Diagnosis not present

## 2019-07-17 DIAGNOSIS — E7800 Pure hypercholesterolemia, unspecified: Secondary | ICD-10-CM | POA: Diagnosis not present

## 2019-07-18 DIAGNOSIS — H02883 Meibomian gland dysfunction of right eye, unspecified eyelid: Secondary | ICD-10-CM | POA: Diagnosis not present

## 2019-07-18 DIAGNOSIS — H02886 Meibomian gland dysfunction of left eye, unspecified eyelid: Secondary | ICD-10-CM | POA: Diagnosis not present

## 2019-07-18 DIAGNOSIS — L718 Other rosacea: Secondary | ICD-10-CM | POA: Diagnosis not present

## 2019-07-28 DIAGNOSIS — H90A22 Sensorineural hearing loss, unilateral, left ear, with restricted hearing on the contralateral side: Secondary | ICD-10-CM | POA: Diagnosis not present

## 2019-07-28 DIAGNOSIS — H90A31 Mixed conductive and sensorineural hearing loss, unilateral, right ear with restricted hearing on the contralateral side: Secondary | ICD-10-CM | POA: Diagnosis not present

## 2019-09-12 DIAGNOSIS — M170 Bilateral primary osteoarthritis of knee: Secondary | ICD-10-CM | POA: Diagnosis not present

## 2019-09-12 DIAGNOSIS — E039 Hypothyroidism, unspecified: Secondary | ICD-10-CM | POA: Diagnosis not present

## 2019-09-12 DIAGNOSIS — E7800 Pure hypercholesterolemia, unspecified: Secondary | ICD-10-CM | POA: Diagnosis not present

## 2019-09-12 DIAGNOSIS — R69 Illness, unspecified: Secondary | ICD-10-CM | POA: Diagnosis not present

## 2019-10-06 DIAGNOSIS — Z961 Presence of intraocular lens: Secondary | ICD-10-CM | POA: Diagnosis not present

## 2019-10-06 DIAGNOSIS — H04123 Dry eye syndrome of bilateral lacrimal glands: Secondary | ICD-10-CM | POA: Diagnosis not present

## 2019-10-06 DIAGNOSIS — Z0100 Encounter for examination of eyes and vision without abnormal findings: Secondary | ICD-10-CM | POA: Diagnosis not present

## 2019-10-06 DIAGNOSIS — H524 Presbyopia: Secondary | ICD-10-CM | POA: Diagnosis not present

## 2019-10-15 DIAGNOSIS — Z79899 Other long term (current) drug therapy: Secondary | ICD-10-CM | POA: Diagnosis not present

## 2019-10-15 DIAGNOSIS — L408 Other psoriasis: Secondary | ICD-10-CM | POA: Diagnosis not present

## 2019-10-15 DIAGNOSIS — E663 Overweight: Secondary | ICD-10-CM | POA: Diagnosis not present

## 2019-10-15 DIAGNOSIS — L4050 Arthropathic psoriasis, unspecified: Secondary | ICD-10-CM | POA: Diagnosis not present

## 2019-10-15 DIAGNOSIS — E559 Vitamin D deficiency, unspecified: Secondary | ICD-10-CM | POA: Diagnosis not present

## 2019-10-15 DIAGNOSIS — M25511 Pain in right shoulder: Secondary | ICD-10-CM | POA: Diagnosis not present

## 2019-10-15 DIAGNOSIS — M542 Cervicalgia: Secondary | ICD-10-CM | POA: Diagnosis not present

## 2019-10-15 DIAGNOSIS — M79641 Pain in right hand: Secondary | ICD-10-CM | POA: Diagnosis not present

## 2019-10-15 DIAGNOSIS — M1710 Unilateral primary osteoarthritis, unspecified knee: Secondary | ICD-10-CM | POA: Diagnosis not present

## 2019-10-15 DIAGNOSIS — Z6828 Body mass index (BMI) 28.0-28.9, adult: Secondary | ICD-10-CM | POA: Diagnosis not present

## 2019-10-21 DIAGNOSIS — E7800 Pure hypercholesterolemia, unspecified: Secondary | ICD-10-CM | POA: Diagnosis not present

## 2019-10-21 DIAGNOSIS — R69 Illness, unspecified: Secondary | ICD-10-CM | POA: Diagnosis not present

## 2019-10-21 DIAGNOSIS — E039 Hypothyroidism, unspecified: Secondary | ICD-10-CM | POA: Diagnosis not present

## 2019-10-21 DIAGNOSIS — M170 Bilateral primary osteoarthritis of knee: Secondary | ICD-10-CM | POA: Diagnosis not present

## 2019-10-22 DIAGNOSIS — H02883 Meibomian gland dysfunction of right eye, unspecified eyelid: Secondary | ICD-10-CM | POA: Diagnosis not present

## 2019-10-22 DIAGNOSIS — Z961 Presence of intraocular lens: Secondary | ICD-10-CM | POA: Diagnosis not present

## 2019-10-22 DIAGNOSIS — L718 Other rosacea: Secondary | ICD-10-CM | POA: Diagnosis not present

## 2019-10-22 DIAGNOSIS — H18529 Epithelial (juvenile) corneal dystrophy, unspecified eye: Secondary | ICD-10-CM | POA: Diagnosis not present

## 2019-10-22 DIAGNOSIS — H02886 Meibomian gland dysfunction of left eye, unspecified eyelid: Secondary | ICD-10-CM | POA: Diagnosis not present

## 2019-10-23 DIAGNOSIS — L400 Psoriasis vulgaris: Secondary | ICD-10-CM | POA: Diagnosis not present

## 2019-10-23 DIAGNOSIS — L738 Other specified follicular disorders: Secondary | ICD-10-CM | POA: Diagnosis not present

## 2019-11-27 DIAGNOSIS — Z1231 Encounter for screening mammogram for malignant neoplasm of breast: Secondary | ICD-10-CM | POA: Diagnosis not present

## 2019-12-01 DIAGNOSIS — M25531 Pain in right wrist: Secondary | ICD-10-CM | POA: Diagnosis not present

## 2019-12-02 DIAGNOSIS — M21372 Foot drop, left foot: Secondary | ICD-10-CM | POA: Diagnosis not present

## 2019-12-02 DIAGNOSIS — M545 Low back pain: Secondary | ICD-10-CM | POA: Diagnosis not present

## 2019-12-02 DIAGNOSIS — S32020D Wedge compression fracture of second lumbar vertebra, subsequent encounter for fracture with routine healing: Secondary | ICD-10-CM | POA: Diagnosis not present

## 2019-12-02 DIAGNOSIS — M48061 Spinal stenosis, lumbar region without neurogenic claudication: Secondary | ICD-10-CM | POA: Diagnosis not present

## 2019-12-11 DIAGNOSIS — M79641 Pain in right hand: Secondary | ICD-10-CM | POA: Diagnosis not present

## 2019-12-11 DIAGNOSIS — Z6827 Body mass index (BMI) 27.0-27.9, adult: Secondary | ICD-10-CM | POA: Diagnosis not present

## 2019-12-11 DIAGNOSIS — E559 Vitamin D deficiency, unspecified: Secondary | ICD-10-CM | POA: Diagnosis not present

## 2019-12-11 DIAGNOSIS — M25511 Pain in right shoulder: Secondary | ICD-10-CM | POA: Diagnosis not present

## 2019-12-11 DIAGNOSIS — L4050 Arthropathic psoriasis, unspecified: Secondary | ICD-10-CM | POA: Diagnosis not present

## 2019-12-11 DIAGNOSIS — L408 Other psoriasis: Secondary | ICD-10-CM | POA: Diagnosis not present

## 2019-12-11 DIAGNOSIS — M25531 Pain in right wrist: Secondary | ICD-10-CM | POA: Diagnosis not present

## 2019-12-11 DIAGNOSIS — M542 Cervicalgia: Secondary | ICD-10-CM | POA: Diagnosis not present

## 2019-12-11 DIAGNOSIS — Z79899 Other long term (current) drug therapy: Secondary | ICD-10-CM | POA: Diagnosis not present

## 2019-12-11 DIAGNOSIS — M1710 Unilateral primary osteoarthritis, unspecified knee: Secondary | ICD-10-CM | POA: Diagnosis not present

## 2019-12-18 DIAGNOSIS — K219 Gastro-esophageal reflux disease without esophagitis: Secondary | ICD-10-CM | POA: Diagnosis not present

## 2019-12-18 DIAGNOSIS — E039 Hypothyroidism, unspecified: Secondary | ICD-10-CM | POA: Diagnosis not present

## 2019-12-18 DIAGNOSIS — R69 Illness, unspecified: Secondary | ICD-10-CM | POA: Diagnosis not present

## 2019-12-18 DIAGNOSIS — M170 Bilateral primary osteoarthritis of knee: Secondary | ICD-10-CM | POA: Diagnosis not present

## 2019-12-18 DIAGNOSIS — E7800 Pure hypercholesterolemia, unspecified: Secondary | ICD-10-CM | POA: Diagnosis not present

## 2019-12-19 DIAGNOSIS — M25531 Pain in right wrist: Secondary | ICD-10-CM | POA: Diagnosis not present

## 2019-12-22 DIAGNOSIS — Z961 Presence of intraocular lens: Secondary | ICD-10-CM | POA: Diagnosis not present

## 2019-12-22 DIAGNOSIS — H18513 Endothelial corneal dystrophy, bilateral: Secondary | ICD-10-CM | POA: Diagnosis not present

## 2019-12-22 DIAGNOSIS — H0288A Meibomian gland dysfunction right eye, upper and lower eyelids: Secondary | ICD-10-CM | POA: Diagnosis not present

## 2019-12-22 DIAGNOSIS — L718 Other rosacea: Secondary | ICD-10-CM | POA: Diagnosis not present

## 2019-12-22 DIAGNOSIS — H0288B Meibomian gland dysfunction left eye, upper and lower eyelids: Secondary | ICD-10-CM | POA: Diagnosis not present

## 2019-12-22 DIAGNOSIS — H18523 Epithelial (juvenile) corneal dystrophy, bilateral: Secondary | ICD-10-CM | POA: Diagnosis not present

## 2019-12-22 DIAGNOSIS — H04123 Dry eye syndrome of bilateral lacrimal glands: Secondary | ICD-10-CM | POA: Diagnosis not present

## 2019-12-23 DIAGNOSIS — M25531 Pain in right wrist: Secondary | ICD-10-CM | POA: Diagnosis not present

## 2019-12-30 DIAGNOSIS — E039 Hypothyroidism, unspecified: Secondary | ICD-10-CM | POA: Diagnosis not present

## 2019-12-30 DIAGNOSIS — Z7901 Long term (current) use of anticoagulants: Secondary | ICD-10-CM | POA: Diagnosis not present

## 2019-12-30 DIAGNOSIS — L4050 Arthropathic psoriasis, unspecified: Secondary | ICD-10-CM | POA: Diagnosis not present

## 2019-12-30 DIAGNOSIS — R69 Illness, unspecified: Secondary | ICD-10-CM | POA: Diagnosis not present

## 2019-12-30 DIAGNOSIS — E7800 Pure hypercholesterolemia, unspecified: Secondary | ICD-10-CM | POA: Diagnosis not present

## 2019-12-30 DIAGNOSIS — Z0000 Encounter for general adult medical examination without abnormal findings: Secondary | ICD-10-CM | POA: Diagnosis not present

## 2020-01-01 DIAGNOSIS — M25531 Pain in right wrist: Secondary | ICD-10-CM | POA: Diagnosis not present

## 2020-01-01 DIAGNOSIS — M65831 Other synovitis and tenosynovitis, right forearm: Secondary | ICD-10-CM | POA: Diagnosis not present

## 2020-01-02 DIAGNOSIS — Z6826 Body mass index (BMI) 26.0-26.9, adult: Secondary | ICD-10-CM | POA: Diagnosis not present

## 2020-01-02 DIAGNOSIS — L4050 Arthropathic psoriasis, unspecified: Secondary | ICD-10-CM | POA: Diagnosis not present

## 2020-01-02 DIAGNOSIS — M25531 Pain in right wrist: Secondary | ICD-10-CM | POA: Diagnosis not present

## 2020-01-02 DIAGNOSIS — M79641 Pain in right hand: Secondary | ICD-10-CM | POA: Diagnosis not present

## 2020-01-02 DIAGNOSIS — L408 Other psoriasis: Secondary | ICD-10-CM | POA: Diagnosis not present

## 2020-01-02 DIAGNOSIS — Z79899 Other long term (current) drug therapy: Secondary | ICD-10-CM | POA: Diagnosis not present

## 2020-01-02 DIAGNOSIS — M542 Cervicalgia: Secondary | ICD-10-CM | POA: Diagnosis not present

## 2020-01-02 DIAGNOSIS — M25511 Pain in right shoulder: Secondary | ICD-10-CM | POA: Diagnosis not present

## 2020-01-02 DIAGNOSIS — E559 Vitamin D deficiency, unspecified: Secondary | ICD-10-CM | POA: Diagnosis not present

## 2020-01-02 DIAGNOSIS — M1710 Unilateral primary osteoarthritis, unspecified knee: Secondary | ICD-10-CM | POA: Diagnosis not present

## 2020-01-07 DIAGNOSIS — G609 Hereditary and idiopathic neuropathy, unspecified: Secondary | ICD-10-CM | POA: Diagnosis not present

## 2020-01-07 DIAGNOSIS — M545 Low back pain: Secondary | ICD-10-CM | POA: Diagnosis not present

## 2020-01-07 DIAGNOSIS — M5416 Radiculopathy, lumbar region: Secondary | ICD-10-CM | POA: Diagnosis not present

## 2020-01-20 ENCOUNTER — Ambulatory Visit: Payer: Medicare HMO | Attending: Internal Medicine

## 2020-01-20 DIAGNOSIS — Z23 Encounter for immunization: Secondary | ICD-10-CM

## 2020-01-20 NOTE — Progress Notes (Signed)
   Covid-19 Vaccination Clinic  Name:  Ashley Griffith    MRN: [id number] DOB: 05-23-1945  01/20/2020  [name] was observed post Covid-19 immunization for 15 minutes without incident. She was provided with Vaccine Information Sheet and instruction to access the V-Safe system.   [name] was instructed to call 911 with any severe reactions post vaccine: Marland Kitchen Difficulty breathing  . Swelling of face and throat  . A fast heartbeat  . A bad rash all over body  . Dizziness and weakness

## 2020-01-28 DIAGNOSIS — R69 Illness, unspecified: Secondary | ICD-10-CM | POA: Diagnosis not present

## 2020-02-04 NOTE — Progress Notes (Signed)
[facility]    Provider:  Dr Jaynee Eagles Requesting Provider: Erline Levine, MD Primary Care Provider:  Aretta Nip, MD  CC:  Sensory changes in the feet  HPI:  Eugene P Noori is a 74 y.o. female here as requested by Erline Levine, MD for peripheral neuropathy. PMHx psoriatic arthritis, osteoarthritis, obesity, hypothyroidism, depression, pulmonary embolism. I reviewed Dr. Melven Sartorius notes, patient has decreased sensation to pinprick in her bilateral feet, left greater than right, in a stocking glove distribution with decreased vibratory sense bilaterally and she was sent to neurology for evaluation of this. Patient had an L4-L5 laminectomy in the past for synovial cyst but returned to see Dr. Vertell Limber within the last year for back pain of several month and left foot drop with ambulation and numbness in the toes of both feet as well as weakness. MRI of the lumbar spine showed L4-L5 foraminal stenosis most likely etiology of the patient's symptoms. In January of this year she had L4/L5 TLIF. Recently saw Dr. Vertell Limber for follow up of back pain(improved); clumsiness, balance changes, potentially due to increasing her gabapentin to 300 mg 3 times daily, now twice daily, she reported improved pain but occasional bilateral back pain that resolves quickly, precipitated by "doing something stupid", her back pain resolves with rest. She complained of bilateral foot numbness with the left foot affected more than the right, sometimes her left foot pain travels out of her foot upper leg to the mid calf region, the numbness affects the bottom of her foot and usually the smallest 2 toes, numbness is constant, possibly related to peripheral neuropathy and not radiculopathy. she continues to use her AFO brace as needed usually while walking or exercising, she also has right hip pain.  Patient is here and reports she has numb feet since 2019. Recently worsening. Started after she had a nerve block in  2019 but maybe before that. Seemed to get worse after the lumbosacral nerve block. The worst left toes and radiates to the bottom of the feet and up the lef a little, the right is mostly toes and underneath. Started in the toes and slowly progressively worsened. Recently started having problems with hand and is having surgery on it Monday  Reviewed notes, labs and imaging from outside physicians, which showed: see above  MRI lumbar spine 02/10/2018: Disc levels:  L1-2: Far left lateral disc protrusion is slightly worse than on the prior study. Mild left foraminal narrowing is now present.  L2-3: Mild broad-based disc bulge is present. There is no significant stenosis.  L3-4: Mild disc bulging is present. There is mild facet hypertrophy bilaterally. Mild foraminal narrowing is noted bilaterally.  L4-5: Right laminectomy is present. There is resection of previous synovial cyst. No residual right subarticular narrowing is present. Progressive leftward disc protrusion and facet hypertrophy contributes to mild left subarticular narrowing. Grade 1 anterolisthesis is now present. There is progressive moderate left foraminal narrowing. Moderate right foraminal narrowing is stable.  L5-S1: Disc bulging and facet hypertrophy are again seen. Central canal is patent. Mild foraminal narrowing is stable bilaterally.  IMPRESSION: 1. Right laminectomy and resection of right-sided synovial cyst at L4-5 with decompression of the right subarticular space. 2. Progressive mild left subarticular narrowing and moderate left foraminal stenosis at L4-5. This is the most likely etiology of the patient's symptoms. 3. Moderate right foraminal narrowing is stable. 4. Mild foraminal narrowing bilaterally at L3-4. 5. Far left lateral disc protrusion at L1-2 with slight progression results in mild left foraminal  stenosis. 6. Mild foraminal narrowing bilaterally at L5-S1 is stable. 7. Grade 1  anterolisthesis at L4-5 is new.   Review of Systems: Patient complains of symptoms per HPI as well as the following symptoms: Easy bruising, easy bleeding, feeling hot, joint pain, joint swelling, cramps, aching muscles, constipation, hearing loss, allergies, skin sensitivity, numbness, depression, anxiety, decreased energy. Pertinent negatives and positives per HPI. All others negative.   Social History   Socioeconomic History  . Marital status: Married    Spouse name: Not on file  . Number of children: 1  . Years of education: Not on file  . Highest education level: Master's degree (e.g., MA, MS, MEng, MEd, MSW, MBA)  Occupational History  . Not on file  Tobacco Use  . Smoking status: Former Smoker    Packs/day: 1.00    Years: 5.00    Pack years: 5.00    Types: Cigarettes    Quit date: 04/24/1973    Years since quitting: 46.8  . Smokeless tobacco: Never Used  Vaping Use  . Vaping Use: Never used  Substance and Sexual Activity  . Alcohol use: Yes    Alcohol/week: 1.0 standard drink    Types: 1 Standard drinks or equivalent per week    Comment: social  . Drug use: No  . Sexual activity: Not on file  Other Topics Concern  . Not on file  Social History Narrative   Lives at home with husband and daughter   Caffeine: about 10 oz coffee/day    Social Determinants of Health   Financial Resource Strain:   . Difficulty of Paying Living Expenses: Not on file  Food Insecurity:   . Worried About Charity fundraiser in the Last Year: Not on file  . Ran Out of Food in the Last Year: Not on file  Transportation Needs:   . Lack of Transportation (Medical): Not on file  . Lack of Transportation (Non-Medical): Not on file  Physical Activity:   . Days of Exercise per Week: Not on file  . Minutes of Exercise per Session: Not on file  Stress:   . Feeling of Stress : Not on file  Social Connections:   . Frequency of Communication with Friends and Family: Not on file  . Frequency  of Social Gatherings with Friends and Family: Not on file  . Attends Religious Services: Not on file  . Active Member of Clubs or Organizations: Not on file  . Attends Archivist Meetings: Not on file  . Marital Status: Not on file  Intimate Partner Violence:   . Fear of Current or Ex-Partner: Not on file  . Emotionally Abused: Not on file  . Physically Abused: Not on file  . Sexually Abused: Not on file    Family History  Problem Relation Age of Onset  . Kidney failure Mother        At age 35  . Heart Problems Mother   . Ulcerative colitis Mother   . Varicose Veins Mother   . Heart attack Father        At age 15  . Gallstones Father   . Depression Father   . Acute myelogenous leukemia Brother        At age 82  . Cancer Maternal Aunt        Breast cancer  . Arthritis Other        "everyone"  . Obesity Other        "many"    Past  Medical History:  Diagnosis Date  . Anxiety   . Arthritis   . Blood clot in vein    leg [id number]  . Depression   . Depression   . DVT (deep venous thrombosis) ([place]) 1996  . GERD (gastroesophageal reflux disease)   . Hemorrhoids    Patient notes intermittent rectal bleeding from these  . High cholesterol   . Hypothyroidism    Seen by Dr. Wilson Singer  . Obesity    Status post significant intentional weight loss in 2003 of > 90 pounds  . Osteoarthritis   . Osteopenia   . PONV (postoperative nausea and vomiting)   . Psoriatic arthritis ([place])    Follows with Dr. Naida Sleight  . Pulmonary embolism ([place]) march 1995  . Rosacea     Patient Active Problem List   Diagnosis Date Noted  . Spondylolisthesis of lumbar region 05/02/2018  . VTE (venous thromboembolism) 09/05/2016  . Bilateral asymmetric sensorineural hearing loss 08/23/2016  . Subacute cough 06/08/2016  . DOE (dyspnea on exertion) 06/08/2016  . Synovial cyst of lumbar facet joint 09/16/2015    Past Surgical History:  Procedure Laterality Date  . blood mass removed from  abdomen  1972   ? questionable ectopic pregnancy  . cataract surgery  2006 and 2007  . childbirth  1976  . colonscopy     x 2  . KNEE ARTHROSCOPY Right 09/18/2012   Procedure: RIGHT ARTHROSCOPY KNEE, Tlateral meniscal debdridement and chondroplasty;  Surgeon: Gearlean Alf, MD;  Location: [facility];  Service: Orthopedics;  Laterality: Right;  . LUMBAR LAMINECTOMY/DECOMPRESSION MICRODISCECTOMY Right 09/16/2015   Procedure: Right Lumbar Four-FiveLaminectomy with resection of synovial cyst;  Surgeon: Erline Levine, MD;  Location: [facility];  Service: Neurosurgery;  Laterality: Right;  right  . nasal poly removed  1964  . OPEN REDUCTION INTERNAL FIXATION (ORIF) METACARPAL Right 08/21/2018   Procedure: OPEN REDUCTION INTERNAL FIXATION (ORIF) AND REPAIR AS INDICATED  RIGHT 5TH METACARPAL;  Surgeon: Iran Planas, MD;  Location: [facility];  Service: Orthopedics;  Laterality: Right;  33min  . sclorotomy both legs veins  2005 or 2006  . SPHINCTEROTOMY  1990's  . superficial keratotomy  2021  . TONSILLECTOMY  as child  . torn meniscus right knee  2014  . TRANSFORAMINAL LUMBAR INTERBODY FUSION (TLIF) WITH PEDICLE SCREW FIXATION 1 LEVEL Left 05/02/2018   Procedure: Left Lumbar four-five Transforaminal lumbar interbody fusion;  Surgeon: Erline Levine, MD;  Location: [facility];  Service: Neurosurgery;  Laterality: Left;  Left Lumbar four-five Transforaminal lumbar interbody fusion    Current Outpatient Medications  Medication Sig Dispense Refill  . acetaminophen (TYLENOL) 650 MG CR tablet Take 650 mg by mouth in the morning and at bedtime.     . Adalimumab (HUMIRA) 40 MG/0.8ML PSKT Inject 0.8 mLs (40 mg total) into the skin every 14 (fourteen) days.    Marland Kitchen atorvastatin (LIPITOR) 10 MG tablet Take 10 mg by mouth at bedtime.     . Biotin 1000 MCG tablet Take 1,000 mcg by mouth daily.    Marland Kitchen buPROPion (WELLBUTRIN XL) 300 MG 24 hr tablet Take 300 mg by mouth daily.    . Calcium Carbonate 500 MG CHEW  Chew 2,000 mg by mouth at bedtime as needed (for reflux or indigestion).     . Calcium Citrate-Vitamin D (CALCIUM CITRATE + D PO) Take 2 tablets by mouth in the morning.     . celecoxib (CELEBREX) 100 MG capsule Take 100 mg by mouth 2 (  two) times daily.     Marland Kitchen doxycycline (ADOXA) 50 MG tablet Take 50 mg by mouth at bedtime.    . folic acid (FOLVITE) 585 MCG tablet Take 800 mcg by mouth every morning.     . gabapentin (NEURONTIN) 300 MG capsule Take 600 mg by mouth at bedtime.     Marland Kitchen Lifitegrast (XIIDRA) 5 % SOLN Place 1 drop into both eyes 2 (two) times daily.     . methotrexate (TREXALL) 10 MG tablet Take 10 mg by mouth once a week. Caution: Chemotherapy. Protect from light.     . metroNIDAZOLE (METROGEL) 1 % gel Apply 1 application topically 2 (two) times daily. To face    . Omega-3 Fatty Acids (OMEGA-3 FISH OIL PO) Take 1,400 mg by mouth daily.     Marland Kitchen omeprazole (PRILOSEC) 20 MG capsule Take 20 mg by mouth daily.    . rivaroxaban (XARELTO) 20 MG TABS tablet Take 1 tablet (20 mg total) by mouth daily with supper.    Marland Kitchen SYNTHROID 88 MCG tablet Take 88 mcg by mouth daily.  4  . Vitamin D, Ergocalciferol, (DRISDOL) 1.25 MG (50000 UT) CAPS capsule Take 50,000 Units by mouth every 7 (seven) days.     No current facility-administered medications for this visit.    Allergies as of 02/05/2020 - Review Complete 02/05/2020  Allergen Reaction Noted  . Erythromycin Hives and Nausea And Vomiting 09/02/2012  . Penicillins Swelling, Rash, and Other (See Comments) 09/02/2012  . Adhesive [tape] Rash 09/02/2012  . Kenalog [triamcinolone acetonide] Other (See Comments) 06/11/2017  . Monistat [miconazole] Itching and Other (See Comments) 12/16/2014  . Secukinumab Itching 08/23/2016  . Thimerosal Itching and Other (See Comments) 12/16/2014    Vitals: BP 124/83 (BP Location: Right Arm, Patient Position: Sitting)   Pulse 63   Ht $R'5\' 5"'ww$  (1.651 m)   Wt 151 lb (68.5 kg)   BMI 25.13 kg/m  Last Weight:  Wt  Readings from Last 1 Encounters:  02/05/20 151 lb (68.5 kg)   Last Height:   Ht Readings from Last 1 Encounters:  02/05/20 $RemoveB'5\' 5"'OwOVshtw$  (1.651 m)     Physical exam: Exam: Gen: NAD, conversant, well nourised,  well groomed                     CV: RRR, no MRG. No Carotid Bruits. No peripheral edema, warm, nontender Eyes: Conjunctivae clear without exudates or hemorrhage  Neuro: Detailed Neurologic Exam  Speech:    Speech is normal; fluent and spontaneous with normal comprehension.  Cognition:    The patient is oriented to person, place, and time;     recent and remote memory intact;     language fluent;     normal attention, concentration,     fund of knowledge Cranial Nerves:    The pupils are equal, round, and reactive to light. Pupils too small to visualize fundi. Visual fields are full to finger confrontation. Extraocular movements are intact. Trigeminal sensation is intact and the muscles of mastication are normal. The face is symmetric. The palate elevates in the midline. Hearing intact. Voice is normal. Shoulder shrug is normal. The tongue has normal motion without fasciculations.   Coordination:    No dysmetria or ataxia  Gait:    Difficulty walking on heel of left foot. Otherwise normal native gait  Motor Observation:    No asymmetry, no atrophy, and no involuntary movements noted. Tone:    Normal muscle tone.    Posture:  Posture is normal. normal erect    Strength: left minimal dorsiflexion weakness. Otherwise strength is intact in the upper and lower limbs.      Sensation: Distal decrease pin prick in the feet to the mid calves,  left great toe vibration absent, right great toe vibration a few seconds.      Reflex Exam:  DTR's: trace Ajs. Normal patellars and biceps.    Deep tendon reflexes in the upper and lower extremities are symmetrical bilaterally.   Toes:    The toes are downgoing bilaterally.   Clonus:    Clonus is absent.    Assessment/Plan:   74  y.o. female here as requested by Erline Levine, MD for peripheral neuropathy. PMHx psoriatic arthritis, osteoarthritis, obesity, hypothyroidism, depression, pulmonary embolism.   Distal sensory changes may be multifactorial including sequelae of prior radiculopathy as well as peripheral polyneuropathy.  I discussed with patient the wide differential of causes for peripheral polyneuropathy, I will check her for causes such as B12 deficiency and diabetes with a hemoglobin A1c as well as a few other labs.  Autoimmune disorders are also a cause of peripheral polyneuropathy and patient does have psoriatic arthritis. Return as needed. I don;t think emg/ncs would offer [place] any additional information at this time.   Orders Placed This Encounter  Procedures  . Hemoglobin A1c  . B12 and Folate Panel  . Heavy metals, blood  . Vitamin B6  . Multiple Myeloma Panel (SPEP&IFE w/QIG)  . Vitamin B1  . Methylmalonic acid, serum  . CBC  . Basic Metabolic Panel    Cc: Erline Levine, MD,  Rankins, Bill Salinas, MD  Sarina Ill, MD  [facility] [address] [place],  [id number]  Phone [phone] Fax [phone]

## 2020-02-05 ENCOUNTER — Ambulatory Visit: Payer: Medicare HMO | Admitting: Neurology

## 2020-02-05 ENCOUNTER — Encounter: Payer: Self-pay | Admitting: Neurology

## 2020-02-05 VITALS — BP 124/83 | HR 63 | Ht 65.0 in | Wt 151.0 lb

## 2020-02-05 DIAGNOSIS — Z79899 Other long term (current) drug therapy: Secondary | ICD-10-CM | POA: Diagnosis not present

## 2020-02-05 DIAGNOSIS — R6889 Other general symptoms and signs: Secondary | ICD-10-CM | POA: Diagnosis not present

## 2020-02-05 DIAGNOSIS — G629 Polyneuropathy, unspecified: Secondary | ICD-10-CM

## 2020-02-05 NOTE — Patient Instructions (Addendum)
Blood work today   Peripheral Neuropathy Peripheral neuropathy is a type of nerve damage. It affects nerves that carry signals between the spinal cord and the arms, legs, and the rest of the body (peripheral nerves). It does not affect nerves in the spinal cord or brain. In peripheral neuropathy, one nerve or a group of nerves may be damaged. Peripheral neuropathy is a broad category that includes many specific nerve disorders, like diabetic neuropathy, hereditary neuropathy, and carpal tunnel syndrome. What are the causes? This condition may be caused by:  Diabetes. This is the most common cause of peripheral neuropathy.  Nerve injury.  Pressure or stress on a nerve that lasts a long time.  Lack (deficiency) of B vitamins. This can result from alcoholism, poor diet, or a restricted diet.  Infections.  Autoimmune diseases, such as rheumatoid arthritis and systemic lupus erythematosus.  Nerve diseases that are passed from parent to child (inherited).  Some medicines, such as cancer medicines (chemotherapy).  Poisonous (toxic) substances, such as lead and mercury.  Too little blood flowing to the legs.  Kidney disease.  Thyroid disease. In some cases, the cause of this condition is not known. What are the signs or symptoms? Symptoms of this condition depend on which of your nerves is damaged. Common symptoms include:  Loss of feeling (numbness) in the feet, hands, or both.  Tingling in the feet, hands, or both.  Burning pain.  Very sensitive skin.  Weakness.  Not being able to move a part of the body (paralysis).  Muscle twitching.  Clumsiness or poor coordination.  Loss of balance.  Not being able to control your bladder.  Feeling dizzy.  Sexual problems. How is this diagnosed? Diagnosing and finding the cause of peripheral neuropathy can be difficult. Your health care provider will take your medical history and do a physical exam. A neurological exam will  also be done. This involves checking things that are affected by your brain, spinal cord, and nerves (nervous system). For example, your health care provider will check your reflexes, how you move, and what you can feel. You may have other tests, such as:  Blood tests.  Electromyogram (EMG) and nerve conduction tests. These tests check nerve function and how well the nerves are controlling the muscles.  Imaging tests, such as CT scans or MRI to rule out other causes of your symptoms.  Removing a small piece of nerve to be examined in a lab (nerve biopsy). This is rare.  Removing and examining a small amount of the fluid that surrounds the brain and spinal cord (lumbar puncture). This is rare. How is this treated? Treatment for this condition may involve:  Treating the underlying cause of the neuropathy, such as diabetes, kidney disease, or vitamin deficiencies.  Stopping medicines that can cause neuropathy, such as chemotherapy.  Medicine to relieve pain. Medicines may include: ? Prescription or over-the-counter pain medicine. ? Antiseizure medicine. ? Antidepressants. ? Pain-relieving patches that are applied to painful areas of skin.  Surgery to relieve pressure on a nerve or to destroy a nerve that is causing pain.  Physical therapy to help improve movement and balance.  Devices to help you move around (assistive devices). Follow these instructions at home: Medicines  Take over-the-counter and prescription medicines only as told by your health care provider. Do not take any other medicines without first asking your health care provider.  Do not drive or use heavy machinery while taking prescription pain medicine. Lifestyle   Do not use  any products that contain nicotine or tobacco, such as cigarettes and e-cigarettes. Smoking keeps blood from reaching damaged nerves. If you need help quitting, ask your health care provider.  Avoid or limit alcohol. Too much alcohol can  cause a vitamin B deficiency, and vitamin B is needed for healthy nerves.  Eat a healthy diet. This includes: ? Eating foods that are high in fiber, such as fresh fruits and vegetables, whole grains, and beans. ? Limiting foods that are high in fat and processed sugars, such as fried or sweet foods. General instructions   If you have diabetes, work closely with your health care provider to keep your blood sugar under control.  If you have numbness in your feet: ? Check every day for signs of injury or infection. Watch for redness, warmth, and swelling. ? Wear padded socks and comfortable shoes. These help protect your feet.  Develop a good support system. Living with peripheral neuropathy can be stressful. Consider talking with a mental health specialist or joining a support group.  Use assistive devices and attend physical therapy as told by your health care provider. This may include using a walker or a cane.  Keep all follow-up visits as told by your health care provider. This is important. Contact a health care provider if:  You have new signs or symptoms of peripheral neuropathy.  You are struggling emotionally from dealing with peripheral neuropathy.  Your pain is not well-controlled. Get help right away if:  You have an injury or infection that is not healing normally.  You develop new weakness in an arm or leg.  You fall frequently. Summary  Peripheral neuropathy is when the nerves in the arms, or legs are damaged, resulting in numbness, weakness, or pain.  There are many causes of peripheral neuropathy, including diabetes, pinched nerves, vitamin deficiencies, autoimmune disease, and hereditary conditions.  Diagnosing and finding the cause of peripheral neuropathy can be difficult. Your health care provider will take your medical history, do a physical exam, and do tests, including blood tests and nerve function tests.  Treatment involves treating the underlying  cause of the neuropathy and taking medicines to help control pain. Physical therapy and assistive devices may also help. This information is not intended to replace advice given to you by your health care provider. Make sure you discuss any questions you have with your health care provider. Document Revised: 03/23/2017 Document Reviewed: 06/19/2016 Elsevier Patient Education  2020 [employer].

## 2020-02-09 DIAGNOSIS — G5601 Carpal tunnel syndrome, right upper limb: Secondary | ICD-10-CM | POA: Diagnosis not present

## 2020-02-09 DIAGNOSIS — M659 Synovitis and tenosynovitis, unspecified: Secondary | ICD-10-CM | POA: Diagnosis not present

## 2020-02-10 LAB — MULTIPLE MYELOMA PANEL, SERUM
Albumin SerPl Elph-Mcnc: 4.1 g/dL (ref 2.9–4.4)
Gamma Glob SerPl Elph-Mcnc: 1.6 g/dL (ref 0.4–1.8)
Globulin, Total: 3.7 g/dL (ref 2.2–3.9)
IgM (Immunoglobulin M), Srm: 166 mg/dL (ref 26–217)
Total Protein: 7.8 g/dL (ref 6.0–8.5)

## 2020-02-10 LAB — METHYLMALONIC ACID, SERUM

## 2020-02-10 LAB — VITAMIN B6

## 2020-02-10 LAB — BASIC METABOLIC PANEL: Creatinine, Ser: 0.77 mg/dL (ref 0.57–1.00)

## 2020-02-11 LAB — BASIC METABOLIC PANEL
BUN/Creatinine Ratio: 19 (ref 12–28)
BUN: 15 mg/dL (ref 8–27)
CO2: 27 mmol/L (ref 20–29)
Calcium: 9.2 mg/dL (ref 8.7–10.3)
Chloride: 100 mmol/L (ref 96–106)
GFR calc Af Amer: 88 mL/min/{1.73_m2} (ref >59)
GFR calc non Af Amer: 76 mL/min/{1.73_m2} (ref >59)
Glucose: 85 mg/dL (ref 65–99)
Potassium: 4.3 mmol/L (ref 3.5–5.2)
Sodium: 139 mmol/L (ref 134–144)

## 2020-02-11 LAB — HEAVY METALS, BLOOD
Arsenic: 1 ug/L — ABNORMAL LOW (ref 2–23)
Lead, Blood: <1 ug/dL (ref 0–4)
Mercury: <1 ug/L (ref 0.0–14.9)

## 2020-02-11 LAB — CBC
Hematocrit: 42.2 % (ref 34.0–46.6)
Hemoglobin: 14.3 g/dL (ref 11.1–15.9)
MCH: 33.7 pg — ABNORMAL HIGH (ref 26.6–33.0)
MCHC: 33.9 g/dL (ref 31.5–35.7)
MCV: 100 fL — ABNORMAL HIGH (ref 79–97)
Platelets: 211 10*3/uL (ref 150–450)
RBC: 4.24 x10E6/uL (ref 3.77–5.28)
RDW: 12.4 % (ref 11.7–15.4)
WBC: 5.6 10*3/uL (ref 3.4–10.8)

## 2020-02-11 LAB — MULTIPLE MYELOMA PANEL, SERUM
Albumin/Glob SerPl: 1.2 (ref 0.7–1.7)
Alpha 1: 0.2 g/dL (ref 0.0–0.4)
Alpha2 Glob SerPl Elph-Mcnc: 0.8 g/dL (ref 0.4–1.0)
B-Globulin SerPl Elph-Mcnc: 1.1 g/dL (ref 0.7–1.3)
IgA/Immunoglobulin A, Serum: 444 mg/dL — ABNORMAL HIGH (ref 64–422)
IgG (Immunoglobin G), Serum: 1838 mg/dL — ABNORMAL HIGH (ref 586–1602)

## 2020-02-11 LAB — B12 AND FOLATE PANEL
Folate: 15.3 ng/mL (ref >3.0)
Vitamin B-12: 394 pg/mL (ref 232–1245)

## 2020-02-11 LAB — VITAMIN B1: Thiamine: 215 nmol/L — ABNORMAL HIGH (ref 66.5–200.0)

## 2020-02-11 LAB — METHYLMALONIC ACID, SERUM: Methylmalonic Acid: 187 nmol/L (ref 0–378)

## 2020-02-11 LAB — HEMOGLOBIN A1C
Est. average glucose Bld gHb Est-mCnc: 117 mg/dL
Hgb A1c MFr Bld: 5.7 % — ABNORMAL HIGH (ref 4.8–5.6)

## 2020-02-17 DIAGNOSIS — R69 Illness, unspecified: Secondary | ICD-10-CM | POA: Diagnosis not present

## 2020-02-17 DIAGNOSIS — M170 Bilateral primary osteoarthritis of knee: Secondary | ICD-10-CM | POA: Diagnosis not present

## 2020-02-17 DIAGNOSIS — E7800 Pure hypercholesterolemia, unspecified: Secondary | ICD-10-CM | POA: Diagnosis not present

## 2020-02-17 DIAGNOSIS — E039 Hypothyroidism, unspecified: Secondary | ICD-10-CM | POA: Diagnosis not present

## 2020-02-17 DIAGNOSIS — K219 Gastro-esophageal reflux disease without esophagitis: Secondary | ICD-10-CM | POA: Diagnosis not present

## 2020-02-24 DIAGNOSIS — Z4789 Encounter for other orthopedic aftercare: Secondary | ICD-10-CM | POA: Diagnosis not present

## 2020-02-24 DIAGNOSIS — M25531 Pain in right wrist: Secondary | ICD-10-CM | POA: Diagnosis not present

## 2020-03-03 DIAGNOSIS — L218 Other seborrheic dermatitis: Secondary | ICD-10-CM | POA: Diagnosis not present

## 2020-03-03 DIAGNOSIS — B009 Herpesviral infection, unspecified: Secondary | ICD-10-CM | POA: Diagnosis not present

## 2020-03-03 DIAGNOSIS — L720 Epidermal cyst: Secondary | ICD-10-CM | POA: Diagnosis not present

## 2020-03-03 DIAGNOSIS — L718 Other rosacea: Secondary | ICD-10-CM | POA: Diagnosis not present

## 2020-03-03 DIAGNOSIS — B0089 Other herpesviral infection: Secondary | ICD-10-CM | POA: Diagnosis not present

## 2020-03-03 DIAGNOSIS — L308 Other specified dermatitis: Secondary | ICD-10-CM | POA: Diagnosis not present

## 2020-03-12 DIAGNOSIS — M25531 Pain in right wrist: Secondary | ICD-10-CM | POA: Diagnosis not present

## 2020-03-12 DIAGNOSIS — M1710 Unilateral primary osteoarthritis, unspecified knee: Secondary | ICD-10-CM | POA: Diagnosis not present

## 2020-03-12 DIAGNOSIS — L408 Other psoriasis: Secondary | ICD-10-CM | POA: Diagnosis not present

## 2020-03-12 DIAGNOSIS — M542 Cervicalgia: Secondary | ICD-10-CM | POA: Diagnosis not present

## 2020-03-12 DIAGNOSIS — Z79899 Other long term (current) drug therapy: Secondary | ICD-10-CM | POA: Diagnosis not present

## 2020-03-12 DIAGNOSIS — M25511 Pain in right shoulder: Secondary | ICD-10-CM | POA: Diagnosis not present

## 2020-03-12 DIAGNOSIS — M79641 Pain in right hand: Secondary | ICD-10-CM | POA: Diagnosis not present

## 2020-03-12 DIAGNOSIS — E559 Vitamin D deficiency, unspecified: Secondary | ICD-10-CM | POA: Diagnosis not present

## 2020-03-12 DIAGNOSIS — Z6824 Body mass index (BMI) 24.0-24.9, adult: Secondary | ICD-10-CM | POA: Diagnosis not present

## 2020-03-12 DIAGNOSIS — L4050 Arthropathic psoriasis, unspecified: Secondary | ICD-10-CM | POA: Diagnosis not present

## 2020-03-23 DIAGNOSIS — K219 Gastro-esophageal reflux disease without esophagitis: Secondary | ICD-10-CM | POA: Diagnosis not present

## 2020-03-23 DIAGNOSIS — B009 Herpesviral infection, unspecified: Secondary | ICD-10-CM | POA: Diagnosis not present

## 2020-03-23 DIAGNOSIS — R69 Illness, unspecified: Secondary | ICD-10-CM | POA: Diagnosis not present

## 2020-03-23 DIAGNOSIS — M170 Bilateral primary osteoarthritis of knee: Secondary | ICD-10-CM | POA: Diagnosis not present

## 2020-03-23 DIAGNOSIS — E039 Hypothyroidism, unspecified: Secondary | ICD-10-CM | POA: Diagnosis not present

## 2020-03-23 DIAGNOSIS — E7800 Pure hypercholesterolemia, unspecified: Secondary | ICD-10-CM | POA: Diagnosis not present

## 2020-04-14 DIAGNOSIS — R69 Illness, unspecified: Secondary | ICD-10-CM | POA: Diagnosis not present

## 2020-04-14 DIAGNOSIS — M170 Bilateral primary osteoarthritis of knee: Secondary | ICD-10-CM | POA: Diagnosis not present

## 2020-04-14 DIAGNOSIS — E039 Hypothyroidism, unspecified: Secondary | ICD-10-CM | POA: Diagnosis not present

## 2020-04-14 DIAGNOSIS — K219 Gastro-esophageal reflux disease without esophagitis: Secondary | ICD-10-CM | POA: Diagnosis not present

## 2020-04-14 DIAGNOSIS — E7800 Pure hypercholesterolemia, unspecified: Secondary | ICD-10-CM | POA: Diagnosis not present

## 2020-05-04 DIAGNOSIS — M5030 Other cervical disc degeneration, unspecified cervical region: Secondary | ICD-10-CM | POA: Diagnosis not present

## 2020-05-04 DIAGNOSIS — M25511 Pain in right shoulder: Secondary | ICD-10-CM | POA: Diagnosis not present

## 2020-05-04 DIAGNOSIS — M7541 Impingement syndrome of right shoulder: Secondary | ICD-10-CM | POA: Diagnosis not present

## 2020-05-04 DIAGNOSIS — S161XXA Strain of muscle, fascia and tendon at neck level, initial encounter: Secondary | ICD-10-CM | POA: Diagnosis not present

## 2020-05-12 DIAGNOSIS — Z961 Presence of intraocular lens: Secondary | ICD-10-CM | POA: Diagnosis not present

## 2020-05-12 DIAGNOSIS — H18522 Epithelial (juvenile) corneal dystrophy, left eye: Secondary | ICD-10-CM | POA: Diagnosis not present

## 2020-05-12 DIAGNOSIS — H524 Presbyopia: Secondary | ICD-10-CM | POA: Diagnosis not present

## 2020-05-12 DIAGNOSIS — H0288B Meibomian gland dysfunction left eye, upper and lower eyelids: Secondary | ICD-10-CM | POA: Diagnosis not present

## 2020-05-12 DIAGNOSIS — H0288A Meibomian gland dysfunction right eye, upper and lower eyelids: Secondary | ICD-10-CM | POA: Diagnosis not present

## 2020-05-12 DIAGNOSIS — L718 Other rosacea: Secondary | ICD-10-CM | POA: Diagnosis not present

## 2020-05-12 DIAGNOSIS — Z0100 Encounter for examination of eyes and vision without abnormal findings: Secondary | ICD-10-CM | POA: Diagnosis not present

## 2020-05-12 DIAGNOSIS — H43811 Vitreous degeneration, right eye: Secondary | ICD-10-CM | POA: Diagnosis not present

## 2020-05-20 ENCOUNTER — Ambulatory Visit: Payer: Medicare HMO | Attending: Orthopedic Surgery | Admitting: Physical Therapy

## 2020-05-20 ENCOUNTER — Other Ambulatory Visit: Payer: Self-pay

## 2020-05-20 ENCOUNTER — Encounter: Payer: Self-pay | Admitting: Physical Therapy

## 2020-05-20 DIAGNOSIS — M25511 Pain in right shoulder: Secondary | ICD-10-CM

## 2020-05-20 DIAGNOSIS — M542 Cervicalgia: Secondary | ICD-10-CM | POA: Diagnosis not present

## 2020-05-20 DIAGNOSIS — M6281 Muscle weakness (generalized): Secondary | ICD-10-CM

## 2020-05-20 DIAGNOSIS — M25611 Stiffness of right shoulder, not elsewhere classified: Secondary | ICD-10-CM | POA: Diagnosis not present

## 2020-05-20 NOTE — Therapy (Signed)
[facility] [address] [place], [place], [address] Phone: [phone]   Fax:  [phone]  Physical Therapy Evaluation  Patient Details  Name: Ashley Griffith MRN: [id number] Date of Birth: 07/07/1945 Referring Provider (PT): Dr. Netta Cedars   Encounter Date: 05/20/2020   PT End of Session - 05/20/20 1500    Visit Number 1    Date for PT Re-Evaluation 08/12/20    Authorization Type [employer] medicare    PT Start Time 1400    PT Stop Time 1450    PT Time Calculation (min) 50 min    Activity Tolerance Patient tolerated treatment well;No increased pain    Behavior During Therapy WFL for tasks assessed/performed           Past Medical History:  Diagnosis Date  . Anxiety   . Arthritis   . Blood clot in vein    leg 11-23-1004  . Depression   . Depression   . DVT (deep venous thrombosis) ([place]) 1996  . GERD (gastroesophageal reflux disease)   . Hemorrhoids    Patient notes intermittent rectal bleeding from these  . High cholesterol   . Hypothyroidism    Seen by Dr. Wilson Singer  . Obesity    Status post significant intentional weight loss in 2003 of > 90 pounds  . Osteoarthritis   . Osteopenia   . PONV (postoperative nausea and vomiting)   . Psoriatic arthritis ([place])    Follows with Dr. Naida Sleight  . Pulmonary embolism ([place]) march 1995  . Rosacea     Past Surgical History:  Procedure Laterality Date  . blood mass removed from abdomen  1972   ? questionable ectopic pregnancy  . cataract surgery  2006 and 2007  . childbirth  1976  . colonscopy     x 2  . KNEE ARTHROSCOPY Right 09/18/2012   Procedure: RIGHT ARTHROSCOPY KNEE, Tlateral meniscal debdridement and chondroplasty;  Surgeon: Gearlean Alf, MD;  Location: [facility];  Service: Orthopedics;  Laterality: Right;  . LUMBAR LAMINECTOMY/DECOMPRESSION MICRODISCECTOMY Right 09/16/2015   Procedure: Right Lumbar Four-FiveLaminectomy with resection of synovial  cyst;  Surgeon: Erline Levine, MD;  Location: [place] [facility];  Service: Neurosurgery;  Laterality: Right;  right  . nasal poly removed  1964  . OPEN REDUCTION INTERNAL FIXATION (ORIF) METACARPAL Right 08/21/2018   Procedure: OPEN REDUCTION INTERNAL FIXATION (ORIF) AND REPAIR AS INDICATED  RIGHT 5TH METACARPAL;  Surgeon: Iran Planas, MD;  Location: [place];  Service: Orthopedics;  Laterality: Right;  60min  . sclorotomy both legs veins  2005 or 2006  . SPHINCTEROTOMY  1990's  . superficial keratotomy  2021  . TONSILLECTOMY  as child  . torn meniscus right knee  2014  . TRANSFORAMINAL LUMBAR INTERBODY FUSION (TLIF) WITH PEDICLE SCREW FIXATION 1 LEVEL Left 05/02/2018   Procedure: Left Lumbar four-five Transforaminal lumbar interbody fusion;  Surgeon: Erline Levine, MD;  Location: [place];  Service: Neurosurgery;  Laterality: Left;  Left Lumbar four-five Transforaminal lumbar interbody fusion    There were no vitals filed for this visit.    Subjective Assessment - 05/20/20 1406    Subjective Patient reports 1.5 months ago she had surgery on her right wrist. Patient started to have pain in right shoulder. She reports that she has bursitis of the right shoulder and some pain can be coming from the neck. Steroid shot on 05/04/2020 to right shoulder.    Patient Stated Goals reduce the pain  Currently in Pain? Yes    Pain Score 5     Pain Location Neck   shoulder   Pain Orientation Right    Pain Descriptors / Indicators Aching;Dull    Pain Type Acute pain    Pain Onset More than a month ago    Pain Frequency Intermittent    Aggravating Factors  looking down to read, increased neck movement, reaching overhead and behind back    Pain Relieving Factors Tylenol, medication    Multiple Pain Sites No              [facility] PT Assessment - 05/20/20 0001      Assessment   Medical Diagnosis M75.41 impingement syndrome of right shoulder    Referring Provider (PT) Dr. Netta Cedars     Onset Date/Surgical Date 03/24/20    Prior Therapy none      Precautions   Precautions None      Restrictions   Weight Bearing Restrictions No      Balance Screen   Has the patient fallen in the past 6 months No    Has the patient had a decrease in activity level because of a fear of falling?  No    Is the patient reluctant to leave their home because of a fear of falling?  No      Home Ecologist residence      Prior Function   Level of Independence Independent    Vocation Retired    Leisure gym 3-4 per week; [employer] bike      Cognition   Overall Cognitive Status Within Functional Limits for tasks assessed      Observation/Other Assessments   Focus on Therapeutic Outcomes (FOTO)  FS primary measure 50% and goal is 61%      Posture/Postural Control   Posture/Postural Control No significant limitations      ROM / Strength   AROM / PROM / Strength AROM;PROM;Strength      AROM   Right Shoulder Extension 55 Degrees   pain   Right Shoulder Flexion 135 Degrees   pain   Right Shoulder ABduction 122 Degrees   pain   Right Shoulder Internal Rotation 70 Degrees   painful; reach behind back to T8; right is T5   Right Shoulder External Rotation 85 Degrees    Cervical Flexion 50    Cervical Extension 30    Cervical - Right Side Bend 20    Cervical - Left Side Bend 30    Cervical - Right Rotation 70    Cervical - Left Rotation 50      PROM   Right Shoulder Flexion 145 Degrees   pain   Right Shoulder ABduction 165 Degrees   pain   Right Shoulder Internal Rotation 75 Degrees   pain   Right Shoulder External Rotation 90 Degrees      Strength   Right Shoulder Flexion 3+/5    Right Shoulder Extension 4/5    Right Shoulder ABduction 3+/5    Right Shoulder Internal Rotation 4-/5    Right Shoulder External Rotation 4-/5      Palpation   Palpation comment tender to right RTC insertion, right biceps tendon, along the scapula, upper trap                       Objective measurements completed on examination: See above findings.       [facility] Adult PT Treatment/Exercise - 05/20/20 0001  Shoulder Exercises: Supine   Flexion AAROM;Right;10 reps    Flexion Limitations using the left hand to help    Other Supine Exercises supine scapular retraction holding for 5 sec, 5x    Other Supine Exercises chin retraction holding for 5 sec 5x      Shoulder Exercises: Standing   ABduction AAROM;Right;5 reps    ABduction Limitations sliding arm on high mat                  PT Education - 05/20/20 1500    Education Details Access Code: [email or web address]    Person(s) Educated Patient    Methods Explanation;Demonstration;Verbal cues;Handout    Comprehension Returned demonstration;Verbalized understanding            PT Short Term Goals - 05/20/20 1511      PT SHORT TERM GOAL #1   Title independent with initial HEP for cervical and right shoulder    Time 4    Status New    Target Date 06/17/20      PT SHORT TERM GOAL #2   Title cervical pain decreased >/= 25% so she is able to move her neck with greater ease    Time 4    Period Weeks    Status New    Target Date 06/17/20      PT SHORT TERM GOAL #3   Title right shoulder pain decreased >/= 25% with reaching overhead and behind her back due to improved mobility    Time 4    Period Weeks    Status New    Target Date 06/17/20             PT Long Term Goals - 05/20/20 1447      PT LONG TERM GOAL #1   Title independent with advanced HEP    Time 12    Period Weeks    Status New    Target Date 08/12/20      PT LONG TERM GOAL #2   Title cervical pain decreased >/= 50% so she is able to perform daily activitis with greater ease    Time 12    Period Weeks    Status New    Target Date 08/12/20      PT LONG TERM GOAL #3   Title reach behind back to T5 so she is able to wash or scratch her back with minimal difficulty    Time 12    Period Weeks    Status  New    Target Date 08/12/20      PT LONG TERM GOAL #4   Title shoulder flexion and abduction increased by 15-20 degrees actively so she is able to perform overhead activites with greater ease    Time 12    Period Weeks    Status New    Target Date 08/12/20      PT LONG TERM GOAL #5   Title able to turn her head with pain decreased >/= 50% due to pain reduced </= 2/10 so she is able to look behind her while driving    Time 12    Period Weeks    Status New    Target Date 08/12/20                  Plan - 05/20/20 1500    Clinical Impression Statement Patient is a 75 year old female with right neck and right pain and right shoulder impingement for the past 1.5 months with sudden  onset. Patient had carpal tunnel release on the right wrist on 02/09/2020 that is healed. Patient reports her intermittent neck and shoulder pain is 5/10. She has pain with reaching overhead and behind her back and moving her neck. Patient has decreased cervical ROM by 25-50%. Her right shoulder ROM is limited by 25% with pain for all motions. Patient right shoulder strength is 3-4/5 with pain. Palpable tenderness located on the right cervical paraspinals, right upper trapezius, right shoulder blade, right RTC insertion and right bicep tendon. Patient would benefit from skilled therapy to reduce her shoulder and neck pain and improve strength so she is able to return her function for daily activities.    Personal Factors and Comorbidities Age;Fitness;Comorbidity 3+    Comorbidities right carpal tunnel release 02/09/2020; psoriatic arthritis; osteopenia; hypothyroidism; pulmonary embolism    Examination-Activity Limitations Bathing;Bed Mobility;Reach Overhead;Dressing;Lift    Examination-Participation Restrictions Meal Prep;Cleaning;Community Activity;Driving;Laundry    Stability/Clinical Decision Making Evolving/Moderate complexity    Clinical Decision Making Moderate    Rehab Potential Excellent    PT  Frequency 2x / week    PT Duration 12 weeks    PT Treatment/Interventions Cryotherapy;Electrical Stimulation;Iontophoresis 4mg /ml Dexamethasone;Moist Heat;Traction;Ultrasound;Neuromuscular re-education;Therapeutic exercise;Therapeutic activities;Patient/family education;Manual techniques;Dry needling;Passive range of motion;Taping;Joint Manipulations    PT Next Visit Plan ionto to right shoulder if MD signs the order; manual work to the cervical and right shoulder; dry needling to right cervical, upper trap, rhomboids; joint mobilization to right shoulder; right shoulder isometrics; ROM to right shoulder and cervical    PT Home Exercise Plan Access Code: [email or web address]    Consulted and Agree with Plan of Care Patient           Patient will benefit from skilled therapeutic intervention in order to improve the following deficits and impairments:  Decreased range of motion,Increased fascial restricitons,Decreased endurance,Increased muscle spasms,Decreased activity tolerance,Pain,Impaired flexibility,Decreased mobility,Decreased strength  Visit Diagnosis: Muscle weakness (generalized) - Plan: PT plan of care cert/re-cert  Acute pain of right shoulder - Plan: PT plan of care cert/re-cert  Stiffness of right shoulder, not elsewhere classified - Plan: PT plan of care cert/re-cert  Cervicalgia - Plan: PT plan of care cert/re-cert     Problem List Patient Active Problem List   Diagnosis Date Noted  . Spondylolisthesis of lumbar region 05/02/2018  . VTE (venous thromboembolism) 09/05/2016  . Bilateral asymmetric sensorineural hearing loss 08/23/2016  . Subacute cough 06/08/2016  . DOE (dyspnea on exertion) 06/08/2016  . Synovial cyst of lumbar facet joint 09/16/2015    Earlie Counts, PT 05/20/20 3:17 PM   [facility] [address] [place], [place], [address] Phone: [phone]   Fax:  [phone]  Name: KYANAH FELAN MRN: [id number] Date of Birth: 1945-08-20

## 2020-05-20 NOTE — Patient Instructions (Signed)
Access Code: [email or web address] URL: [email or web address] Date: 05/20/2020 Prepared by: Earlie Counts  Exercises Supine Shoulder Flexion AAROM with Hands Clasped - 2 x daily - 7 x weekly - 1 sets - 10 reps Supine Scapular Retraction - 1 x daily - 7 x weekly - 1 sets - 10 reps - 5 sec hold Supine Cervical Retraction with Towel - 1 x daily - 7 x weekly - 1 sets - 5 reps - 5 sec hold Seated Shoulder Abduction Towel Slide at Table Top - 2 x daily - 7 x weekly - 1 sets - 10 reps  Patient Education Shoulder Impingement [facility] [address] [place], [place] [address] Phone # [phone] Fax [phone]

## 2020-05-24 ENCOUNTER — Encounter: Payer: Self-pay | Admitting: Podiatry

## 2020-05-24 ENCOUNTER — Other Ambulatory Visit: Payer: Self-pay

## 2020-05-24 ENCOUNTER — Ambulatory Visit (INDEPENDENT_AMBULATORY_CARE_PROVIDER_SITE_OTHER): Payer: Medicare HMO | Admitting: Podiatry

## 2020-05-24 DIAGNOSIS — S91219A Laceration without foreign body of unspecified toe(s) with damage to nail, initial encounter: Secondary | ICD-10-CM | POA: Diagnosis not present

## 2020-05-24 DIAGNOSIS — L600 Ingrowing nail: Secondary | ICD-10-CM | POA: Diagnosis not present

## 2020-05-24 MED ORDER — MUPIROCIN 2 % EX OINT: 1.0000 "application " | TOPICAL_OINTMENT | Freq: Every day | CUTANEOUS | 0 refills | Status: DC

## 2020-05-24 NOTE — Progress Notes (Signed)
  Subjective:  Patient ID: CANDIES PALM, female    DOB: 1946/02/16,  MRN: [id number]  Chief Complaint  Patient presents with  . Nail Problem    Right foot 3rd toe PT stated that four days ago she was cleaning out her nail and she clipped the nail and ever since then she has been in pain. Possible bilateral hallux ingrown    75 y.o. female presents with the above complaint. History confirmed with patient.   Objective:  Physical Exam: warm, good capillary refill, no trophic changes or ulcerative lesions, normal DP and PT pulses and normal sensory exam. Left Foot: mild incurvated border lateral hallux nail  Right Foot: mild incurvated border medial hallux nail, small area of dried blood and tenderness in the 3rd hyponychium    Assessment:   1. Laceration of nail bed of toe, initial encounter   2. Ingrowing right great toenail   3. Ingrowing left great toenail      Plan:  Patient was evaluated and treated and all questions answered.  Expect small cut in nail bed will heal. Rx sent for mupirocin to apply daily  Incurvated and offending nail borders sharply debrided in slant back fashion. Did not require formal partial nail avulsion, if it recurs or worsens we will consider this  No follow-ups on file.

## 2020-05-26 ENCOUNTER — Encounter: Payer: Medicare HMO | Admitting: Physical Therapy

## 2020-05-28 DIAGNOSIS — E039 Hypothyroidism, unspecified: Secondary | ICD-10-CM | POA: Diagnosis not present

## 2020-05-28 DIAGNOSIS — E7800 Pure hypercholesterolemia, unspecified: Secondary | ICD-10-CM | POA: Diagnosis not present

## 2020-05-28 DIAGNOSIS — R69 Illness, unspecified: Secondary | ICD-10-CM | POA: Diagnosis not present

## 2020-05-28 DIAGNOSIS — K219 Gastro-esophageal reflux disease without esophagitis: Secondary | ICD-10-CM | POA: Diagnosis not present

## 2020-05-28 DIAGNOSIS — M170 Bilateral primary osteoarthritis of knee: Secondary | ICD-10-CM | POA: Diagnosis not present

## 2020-06-02 ENCOUNTER — Other Ambulatory Visit: Payer: Self-pay

## 2020-06-02 ENCOUNTER — Ambulatory Visit: Payer: Medicare HMO | Attending: Orthopedic Surgery | Admitting: Physical Therapy

## 2020-06-02 ENCOUNTER — Encounter: Payer: Self-pay | Admitting: Physical Therapy

## 2020-06-02 DIAGNOSIS — M6281 Muscle weakness (generalized): Secondary | ICD-10-CM

## 2020-06-02 DIAGNOSIS — M25511 Pain in right shoulder: Secondary | ICD-10-CM | POA: Diagnosis present

## 2020-06-02 DIAGNOSIS — M542 Cervicalgia: Secondary | ICD-10-CM | POA: Diagnosis present

## 2020-06-02 DIAGNOSIS — M25611 Stiffness of right shoulder, not elsewhere classified: Secondary | ICD-10-CM | POA: Diagnosis present

## 2020-06-02 NOTE — Patient Instructions (Addendum)
IONTOPHORESIS PATIENT PRECAUTIONS & CONTRAINDICATIONS:  . Redness under one or both electrodes can occur.  This characterized by a uniform redness that usually disappears within 12 hours of treatment. . Small pinhead size blisters may result in response to the drug.  Contact your physician if the problem persists more than 24 hours. . On rare occasions, iontophoresis therapy can result in temporary skin reactions such as rash, inflammation, irritation or burns.  The skin reactions may be the result of individual sensitivity to the ionic solution used, the condition of the skin at the start of treatment, reaction to the materials in the electrodes, allergies or sensitivity to dexamethasone, or a poor connection between the patch and your skin.  Discontinue using iontophoresis if you have any of these reactions and report to your therapist. . Remove the Patch or electrodes if you have any undue sensation of pain or burning during the treatment and report discomfort to your therapist. . Tell your Therapist if you have had known adverse reactions to the application of electrical current. . If using the Patch, the LED light will turn off when treatment is complete and the patch can be removed.  Approximate treatment time is 1-3 hours.  Remove the patch when light goes off or after 6 hours. . The Patch can be worn during normal activity, however excessive motion where the electrodes have been placed can cause poor contact between the skin and the electrode or uneven electrical current resulting in greater risk of skin irritation. Marland Kitchen Keep out of the reach of children.   . DO NOT use if you have a cardiac pacemaker or any other electrically sensitive implanted device. . DO NOT use if you have a known sensitivity to dexamethasone. . DO NOT use during Magnetic Resonance Imaging (MRI). . DO NOT use over broken or compromised skin (e.g. sunburn, cuts, or acne) due to the increased risk of skin reaction. . DO  NOT SHAVE over the area to be treated:  To establish good contact between the Patch and the skin, excessive hair may be clipped. . DO NOT place the Patch or electrodes on or over your eyes, directly over your heart, or brain. . DO NOT reuse the Patch or electrodes as this may cause burns to occur.  Trigger Point Dry Needling  . What is Trigger Point Dry Needling (DN)? o DN is a physical therapy technique used to treat muscle pain and dysfunction. Specifically, DN helps deactivate muscle trigger points (muscle knots).  o A thin filiform needle is used to penetrate the skin and stimulate the underlying trigger point. The goal is for a local twitch response (LTR) to occur and for the trigger point to relax. No medication of any kind is injected during the procedure.   . What Does Trigger Point Dry Needling Feel Like?  o The procedure feels different for each individual patient. Some patients report that they do not actually feel the needle enter the skin and overall the process is not painful. Very mild bleeding may occur. However, many patients feel a deep cramping in the muscle in which the needle was inserted. This is the local twitch response.   Marland Kitchen How Will I feel after the treatment? o Soreness is normal, and the onset of soreness may not occur for a few hours. Typically this soreness does not last longer than two days.  o Bruising is uncommon, however; ice can be used to decrease any possible bruising.  o In rare cases feeling tired or  nauseous after the treatment is normal. In addition, your symptoms may get worse before they get better, this period will typically not last longer than 24 hours.   . What Can I do After My Treatment? o Increase your hydration by drinking more water for the next 24 hours. o You may place ice or heat on the areas treated that have become sore, however, do not use heat on inflamed or bruised areas. Heat often brings more relief post needling. o You can continue  your regular activities, but vigorous activity is not recommended initially after the treatment for 24 hours. o DN is best combined with other physical therapy such as strengthening, stretching, and other therapies.    [facility] [address], [place], [place] [address] Phone # [phone] Fax [phone]

## 2020-06-02 NOTE — Therapy (Signed)
[facility] [address], [place], [place], [address] Phone: [phone]   Fax:  [phone]  Physical Therapy Evaluation  Patient Details  Name: Ashley Griffith MRN: [id number] Date of Birth: 12/11/1945 Referring Provider (PT): Dr. Netta Cedars   Encounter Date: 06/02/2020   PT End of Session - 06/02/20 1144    Visit Number 2    Date for PT Re-Evaluation 08/12/20    Authorization Type [employer] medicare    Authorization - Visit Number 2    Authorization - Number of Visits 10    PT Start Time 1100    PT Stop Time 1142    PT Time Calculation (min) 42 min    Activity Tolerance Patient tolerated treatment well;No increased pain    Behavior During Therapy WFL for tasks assessed/performed           Past Medical History:  Diagnosis Date  . Anxiety   . Arthritis   . Blood clot in vein    leg 11-23-1004  . Depression   . Depression   . DVT (deep venous thrombosis) ([place]) 1996  . GERD (gastroesophageal reflux disease)   . Hemorrhoids    Patient notes intermittent rectal bleeding from these  . High cholesterol   . Hypothyroidism    Seen by Dr. Wilson Singer  . Obesity    Status post significant intentional weight loss in 2003 of > 90 pounds  . Osteoarthritis   . Osteopenia   . PONV (postoperative nausea and vomiting)   . Psoriatic arthritis ([place])    Follows with Dr. Naida Sleight  . Pulmonary embolism ([place]) march 1995  . Rosacea     Past Surgical History:  Procedure Laterality Date  . blood mass removed from abdomen  1972   ? questionable ectopic pregnancy  . cataract surgery  2006 and 2007  . childbirth  1976  . colonscopy     x 2  . KNEE ARTHROSCOPY Right 09/18/2012   Procedure: RIGHT ARTHROSCOPY KNEE, Tlateral meniscal debdridement and chondroplasty;  Surgeon: Gearlean Alf, MD;  Location: [facility];  Service: Orthopedics;  Laterality: Right;  . LUMBAR LAMINECTOMY/DECOMPRESSION MICRODISCECTOMY Right 09/16/2015    Procedure: Right Lumbar Four-FiveLaminectomy with resection of synovial cyst;  Surgeon: Erline Levine, MD;  Location: [place] [facility];  Service: Neurosurgery;  Laterality: Right;  right  . nasal poly removed  1964  . OPEN REDUCTION INTERNAL FIXATION (ORIF) METACARPAL Right 08/21/2018   Procedure: OPEN REDUCTION INTERNAL FIXATION (ORIF) AND REPAIR AS INDICATED  RIGHT 5TH METACARPAL;  Surgeon: Iran Planas, MD;  Location: [place] [facility];  Service: Orthopedics;  Laterality: Right;  53min  . sclorotomy both legs veins  2005 or 2006  . SPHINCTEROTOMY  1990's  . superficial keratotomy  2021  . TONSILLECTOMY  as child  . torn meniscus right knee  2014  . TRANSFORAMINAL LUMBAR INTERBODY FUSION (TLIF) WITH PEDICLE SCREW FIXATION 1 LEVEL Left 05/02/2018   Procedure: Left Lumbar four-five Transforaminal lumbar interbody fusion;  Surgeon: Erline Levine, MD;  Location: [place];  Service: Neurosurgery;  Laterality: Left;  Left Lumbar four-five Transforaminal lumbar interbody fusion    There were no vitals filed for this visit.    Subjective Assessment - 06/02/20 1105    Subjective I was given a neck brace and do not know what to do with it. I have less pain with turning my head with driving.    Patient Stated Goals reduce the pain    Currently in Pain?  Yes                          Objective measurements completed on examination: See above findings.       [facility] PT Treatment/Exercise - 06/02/20 0001      Self-Care   Self-Care Other Self-Care Comments    Other Self-Care Comments  educated patient on how to use her neck brace from the doctor and wear while reading; how to read on the exercise bike without straining her neck      Modalities   Modalities Iontophoresis      Iontophoresis   Type of Iontophoresis Dexamethasone    Location right RTC insertion    Dose 1 ml, #1    Time 6 hou      Manual Therapy   Manual Therapy Soft tissue mobilization    Soft tissue  mobilization left sidely to right RTC, interscapula, SCM, cervical paraspinals, and scalenes                  PT Education - 06/02/20 1143    Education Details information on iontophoresis, dry needling, ways to reduce strain with reading, how to put cervical brace on    Person(s) Educated Patient    Methods Explanation;Handout;Demonstration    Comprehension Verbalized understanding;Returned demonstration            PT Short Term Goals - 05/20/20 1511      PT SHORT TERM GOAL #1   Title independent with initial HEP for cervical and right shoulder    Time 4    Status New    Target Date 06/17/20      PT SHORT TERM GOAL #2   Title cervical pain decreased >/= 25% so she is able to move her neck with greater ease    Time 4    Period Weeks    Status New    Target Date 06/17/20      PT SHORT TERM GOAL #3   Title right shoulder pain decreased >/= 25% with reaching overhead and behind her back due to improved mobility    Time 4    Period Weeks    Status New    Target Date 06/17/20             PT Long Term Goals - 05/20/20 1447      PT LONG TERM GOAL #1   Title independent with advanced HEP    Time 12    Period Weeks    Status New    Target Date 08/12/20      PT LONG TERM GOAL #2   Title cervical pain decreased >/= 50% so she is able to perform daily activitis with greater ease    Time 12    Period Weeks    Status New    Target Date 08/12/20      PT LONG TERM GOAL #3   Title reach behind back to T5 so she is able to wash or scratch her back with minimal difficulty    Time 12    Period Weeks    Status New    Target Date 08/12/20      PT LONG TERM GOAL #4   Title shoulder flexion and abduction increased by 15-20 degrees actively so she is able to perform overhead activites with greater ease    Time 12    Period Weeks    Status New    Target Date 08/12/20  PT LONG TERM GOAL #5   Title able to turn her head with pain decreased >/= 50% due to pain  reduced </= 2/10 so she is able to look behind her while driving    Time 12    Period Weeks    Status New    Target Date 08/12/20                  Plan - 06/02/20 1120    Clinical Impression Statement Patient will have increased cervical pain with reading so went over ways to reduce strain on her neck. Patient got a cervical brace to wear from MD. Instructed patient on how to use it and use it with reading since that is when she gets pain. Patient has tightness in the cervical musculature, interscapular and RTC. Patient reports her stretches are helping with her pain. Patient will benefit from skilled therapy to reduce her shoulder and neck pain and improve strength so she is able to return her function for daily activities.    Personal Factors and Comorbidities Age;Fitness;Comorbidity 3+    Comorbidities right carpal tunnel release 02/09/2020; psoriatic arthritis; osteopenia; hypothyroidism; pulmonary embolism    Examination-Activity Limitations Bathing;Bed Mobility;Reach Overhead;Dressing;Lift    Examination-Participation Restrictions Meal Prep;Cleaning;Community Activity;Driving;Laundry    Stability/Clinical Decision Making Evolving/Moderate complexity    Rehab Potential Excellent    PT Frequency 2x / week    PT Duration 12 weeks    PT Treatment/Interventions Cryotherapy;Electrical Stimulation;Iontophoresis 4mg /ml Dexamethasone;Moist Heat;Traction;Ultrasound;Neuromuscular re-education;Therapeutic exercise;Therapeutic activities;Patient/family education;Manual techniques;Dry needling;Passive range of motion;Taping;Joint Manipulations    PT Next Visit Plan see how ionto went; dry needling to right upper trap, SCM, subocciptial, cervical paraspinals, joint mobilization to right shoulder, right shoulder isometrics    PT Home Exercise Plan Access Code: [email or web address]    Recommended Other Services MD signed the initial note    Consulted and Agree with Plan of Care Patient           Patient  will benefit from skilled therapeutic intervention in order to improve the following deficits and impairments:  Decreased range of motion,Increased fascial restricitons,Decreased endurance,Increased muscle spasms,Decreased activity tolerance,Pain,Impaired flexibility,Decreased mobility,Decreased strength  Visit Diagnosis: Muscle weakness (generalized)  Acute pain of right shoulder  Stiffness of right shoulder, not elsewhere classified  Cervicalgia     Problem List Patient Active Problem List   Diagnosis Date Noted  . Spondylolisthesis of lumbar region 05/02/2018  . VTE (venous thromboembolism) 09/05/2016  . Bilateral asymmetric sensorineural hearing loss 08/23/2016  . Subacute cough 06/08/2016  . DOE (dyspnea on exertion) 06/08/2016  . Synovial cyst of lumbar facet joint 09/16/2015    Earlie Counts, PT 06/02/20 11:48 AM   [facility] [address], [place], [place], [address] Phone: [phone]   Fax:  [phone]  Name: AVY BARLETT MRN: [id number] Date of Birth: [birth year]

## 2020-06-07 DIAGNOSIS — S61210A Laceration without foreign body of right index finger without damage to nail, initial encounter: Secondary | ICD-10-CM | POA: Diagnosis not present

## 2020-06-07 DIAGNOSIS — H04129 Dry eye syndrome of unspecified lacrimal gland: Secondary | ICD-10-CM | POA: Diagnosis not present

## 2020-06-07 DIAGNOSIS — I951 Orthostatic hypotension: Secondary | ICD-10-CM | POA: Diagnosis not present

## 2020-06-07 DIAGNOSIS — D84821 Immunodeficiency due to drugs: Secondary | ICD-10-CM | POA: Diagnosis not present

## 2020-06-07 DIAGNOSIS — E039 Hypothyroidism, unspecified: Secondary | ICD-10-CM | POA: Diagnosis not present

## 2020-06-07 DIAGNOSIS — Z7901 Long term (current) use of anticoagulants: Secondary | ICD-10-CM | POA: Diagnosis not present

## 2020-06-07 DIAGNOSIS — G8929 Other chronic pain: Secondary | ICD-10-CM | POA: Diagnosis not present

## 2020-06-07 DIAGNOSIS — L4050 Arthropathic psoriasis, unspecified: Secondary | ICD-10-CM | POA: Diagnosis not present

## 2020-06-07 DIAGNOSIS — E785 Hyperlipidemia, unspecified: Secondary | ICD-10-CM | POA: Diagnosis not present

## 2020-06-07 DIAGNOSIS — R69 Illness, unspecified: Secondary | ICD-10-CM | POA: Diagnosis not present

## 2020-06-07 DIAGNOSIS — S61319A Laceration without foreign body of unspecified finger with damage to nail, initial encounter: Secondary | ICD-10-CM | POA: Diagnosis not present

## 2020-06-07 DIAGNOSIS — G4700 Insomnia, unspecified: Secondary | ICD-10-CM | POA: Diagnosis not present

## 2020-06-07 DIAGNOSIS — Z008 Encounter for other general examination: Secondary | ICD-10-CM | POA: Diagnosis not present

## 2020-06-07 DIAGNOSIS — G629 Polyneuropathy, unspecified: Secondary | ICD-10-CM | POA: Diagnosis not present

## 2020-06-07 IMAGING — MR MR LUMBAR SPINE W/O CM
4 of 5 series · 18 of 48 positions shown · non-contrast
Comparison: MRI of lumbar spine 07/03/2015

CLINICAL DATA: Low back pain extending into left hip and buttocks
extending left leg and ankle. Progressive low back and left leg pain
with failure conservative treatment.

EXAM:
MRI LUMBAR SPINE WITHOUT CONTRAST
TECHNIQUE: Multiplanar, multisequence MR imaging of the lumbar spine was
performed. No intravenous contrast was administered.

[Series 2: T2 · sagittal · 4.0mm · 0.51mm/px · 6 of 14 slices shown (1 of 2)]
[im 1/14]
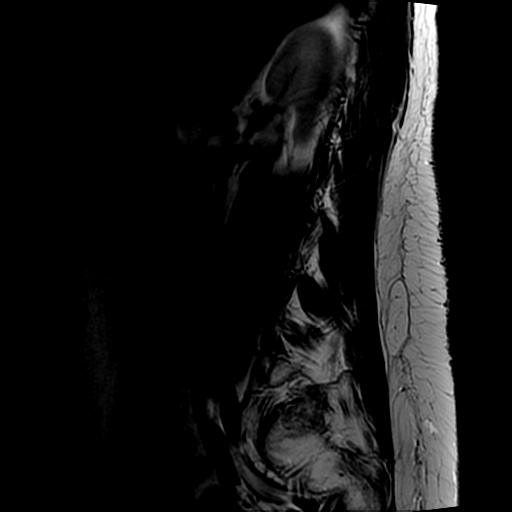
[im 3/14]
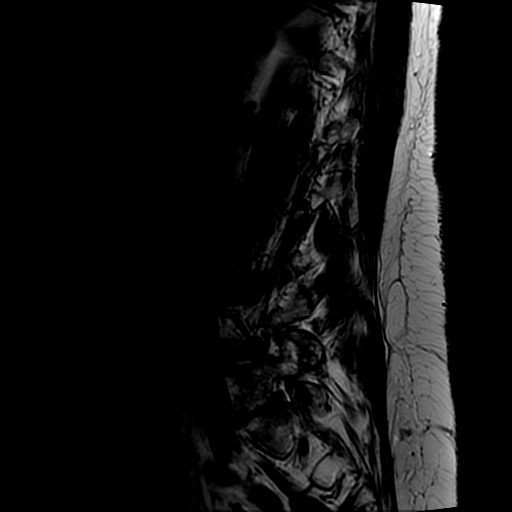
[im 6/14]
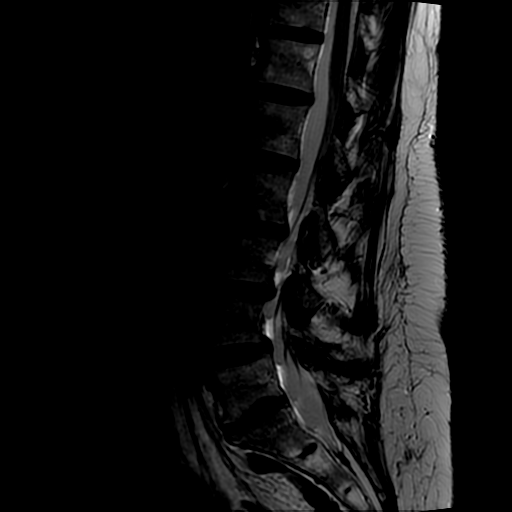
[im 8/14]
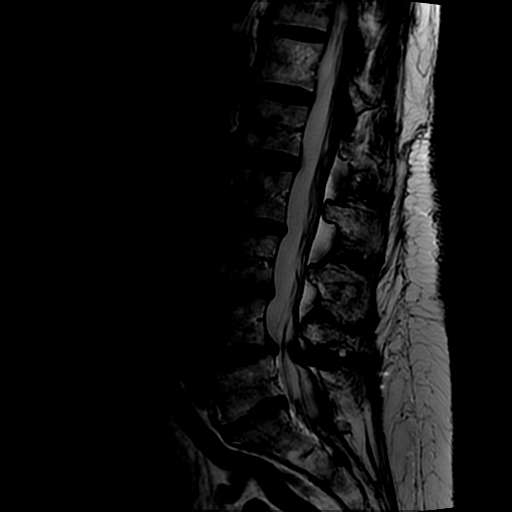
[im 11/14]
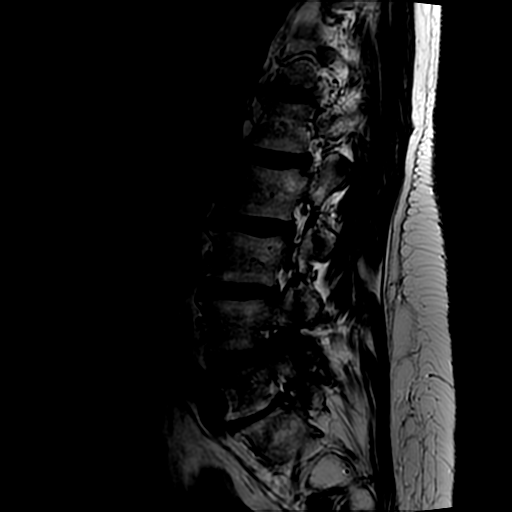
[im 14/14]
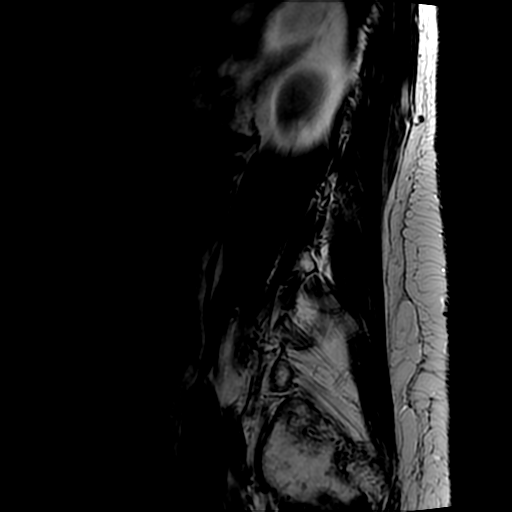

[Series 4: T1 · sagittal · 4.0mm · 0.51mm/px · 3 of 14 slices shown (1 of 2)]
[im 3/14]
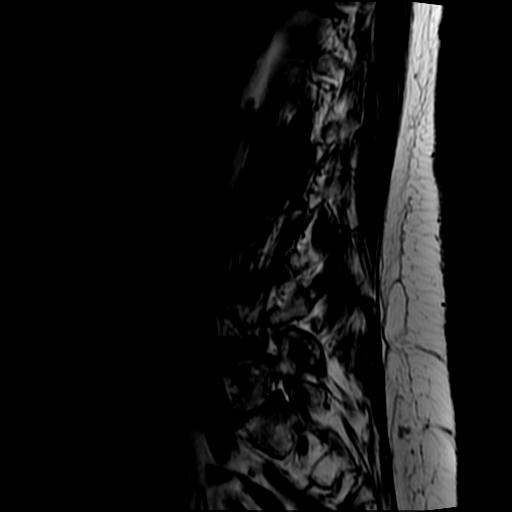
[im 8/14]
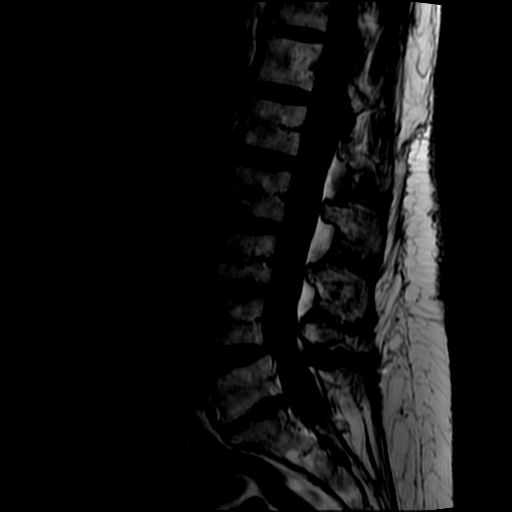
[im 14/14]
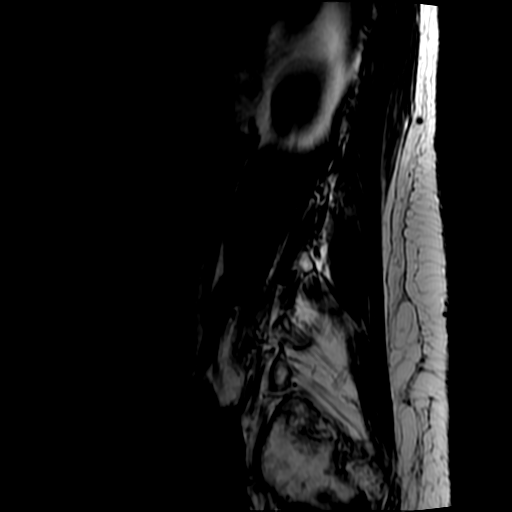

[Series 5: T2 · axial · 4.0mm · 0.39mm/px · z∈[-30,+125]mm · 6 of 38 slices shown (2 of 2)]
[im 1/38]
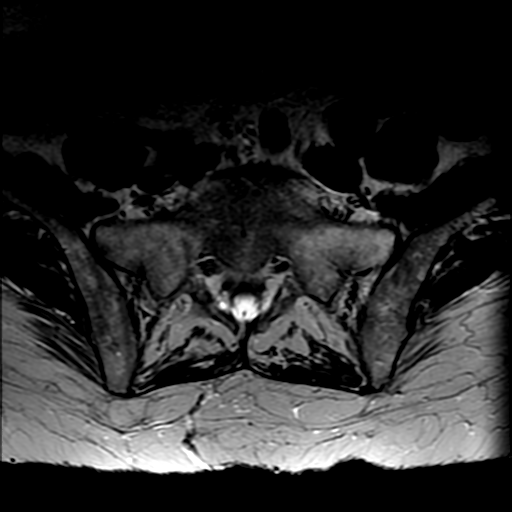
[im 6/38]
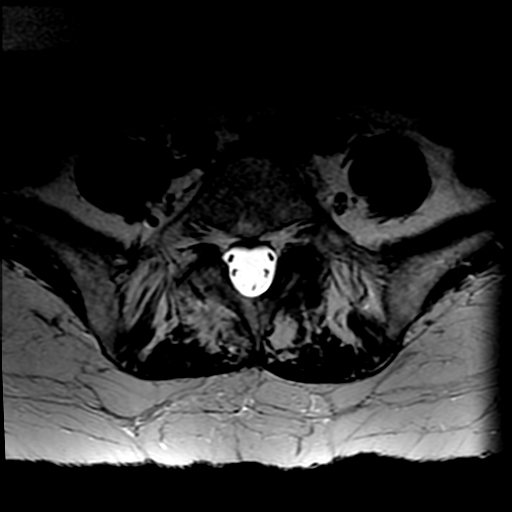
[im 11/38]
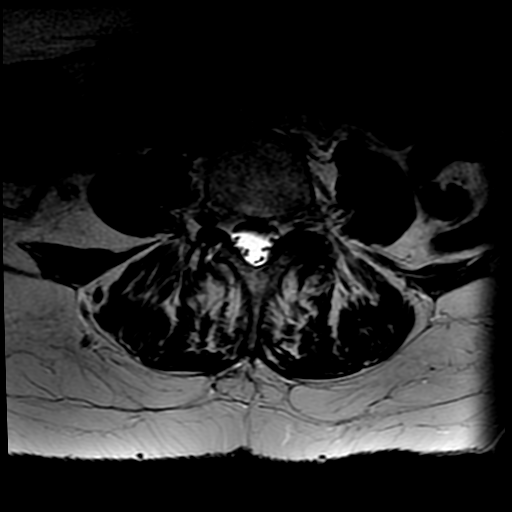
[im 16/38]
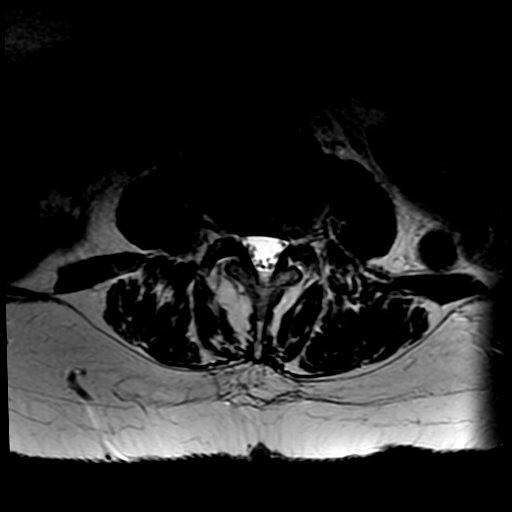
[im 19/38]
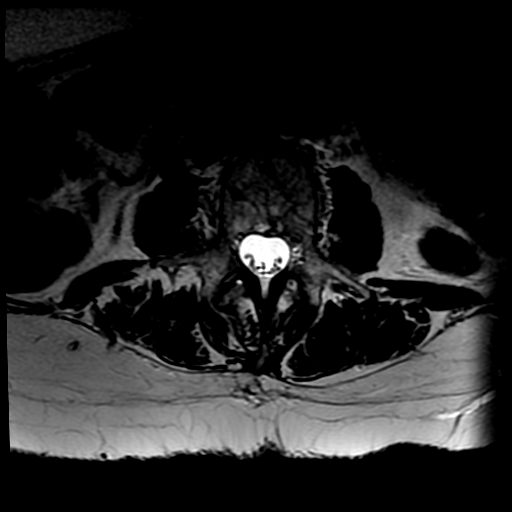
[im 32/38]
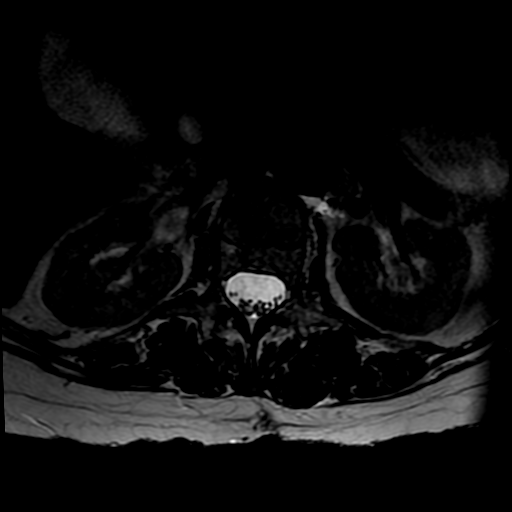

[Series 6: T1 · axial · 4.0mm · 0.39mm/px · z∈[-5,+125]mm · 3 of 38 slices shown (2 of 2)]
[im 6/38]
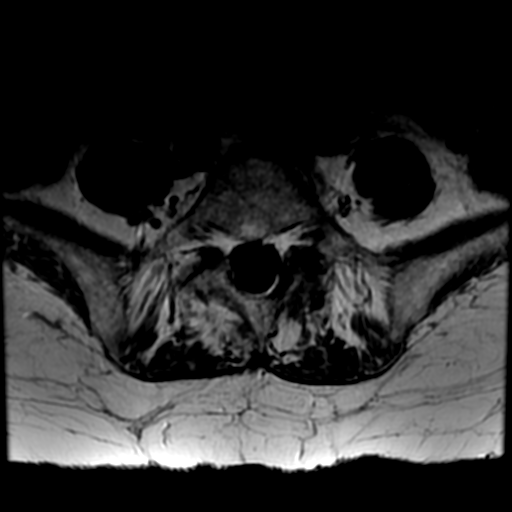
[im 19/38]
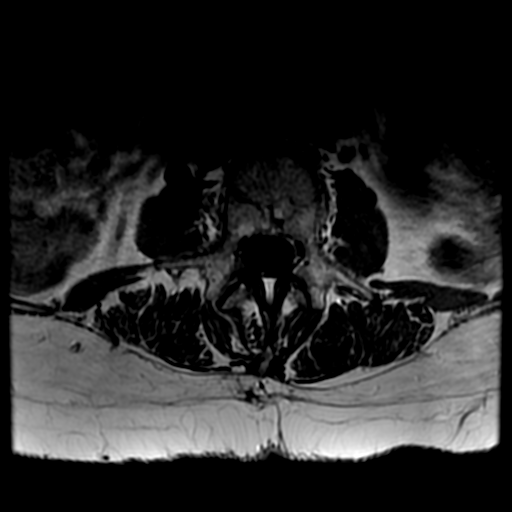
[im 32/38]
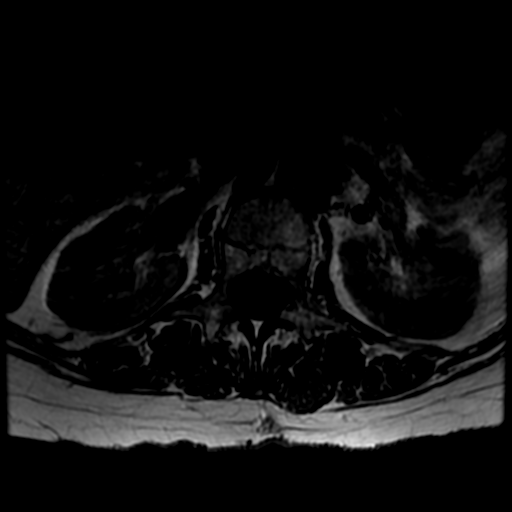

[18 of 48 positions shown; findings below may reference images not displayed]

FINDINGS: Segmentation: 5 non rib-bearing lumbar type vertebral bodies are
present. The lowest fully formed vertebral body is L5.

Alignment: Grade 1 anterolisthesis at L4-5 is new, measuring 4 mm in
the midline. There is slight retrolisthesis at L3-4. AP alignment is
otherwise anatomic. Levoconvex curvature is centered at L3.

Vertebrae: Right posterolateral angioma present L1-2. Chronic
endplate marrow changes are again seen at L5-S1. Marrow signal and
vertebral body heights are otherwise normal.

Conus medullaris and cauda equina: Conus extends to the L1 level.
Conus and cauda equina appear normal.

Paraspinal and other soft tissues: Limited imaging of the abdomen is
unremarkable. There is no significant adenopathy. No solid organ
lesions are present.

Disc levels:

L1-2: Far left lateral disc protrusion is slightly worse than on the
prior study. Mild left foraminal narrowing is now present.

L2-3: Mild broad-based disc bulge is present. There is no
significant stenosis.

L3-4: Mild disc bulging is present. There is mild facet hypertrophy
bilaterally. Mild foraminal narrowing is noted bilaterally.

L4-5: Right laminectomy is present. There is resection of previous
synovial cyst. No residual right subarticular narrowing is present.
Progressive leftward disc protrusion and facet hypertrophy
contributes to mild left subarticular narrowing. Grade 1
anterolisthesis is now present. There is progressive moderate left
foraminal narrowing. Moderate right foraminal narrowing is stable.

L5-S1: Disc bulging and facet hypertrophy are again seen. Central
canal is patent. Mild foraminal narrowing is stable bilaterally.
IMPRESSION: 1. Right laminectomy and resection of right-sided synovial cyst at
L4-5 with decompression of the right subarticular space.
2. Progressive mild left subarticular narrowing and moderate left
foraminal stenosis at L4-5. This is the most likely etiology of the
patient's symptoms.
3. Moderate right foraminal narrowing is stable.
4. Mild foraminal narrowing bilaterally at L3-4.
5. Far left lateral disc protrusion at L1-2 with slight progression
results in mild left foraminal stenosis.
6. Mild foraminal narrowing bilaterally at L5-S1 is stable.
7. Grade 1 anterolisthesis at L4-5 is new.

## 2020-06-09 ENCOUNTER — Ambulatory Visit: Payer: Medicare HMO

## 2020-06-09 ENCOUNTER — Other Ambulatory Visit: Payer: Self-pay

## 2020-06-09 DIAGNOSIS — M542 Cervicalgia: Secondary | ICD-10-CM | POA: Diagnosis not present

## 2020-06-09 DIAGNOSIS — M25511 Pain in right shoulder: Secondary | ICD-10-CM

## 2020-06-09 DIAGNOSIS — M25611 Stiffness of right shoulder, not elsewhere classified: Secondary | ICD-10-CM

## 2020-06-09 DIAGNOSIS — M6281 Muscle weakness (generalized): Secondary | ICD-10-CM

## 2020-06-09 NOTE — Patient Instructions (Signed)
Access Code: [email or web address] URL: [email or web address] Date: 06/09/2020 Prepared by: Claiborne Billings  Exercises  Seated Neck Sidebending Stretch - 2-3 x daily - 7 x weekly - 1 sets - 3 reps - 20 hold Seated Cervical Rotation AROM - 2-3 x daily - 7 x weekly - 1 sets - 3 reps - 20 hold Standing Row with Anchored Resistance - 1 x daily - 7 x weekly - 2 sets - 10 reps Shoulder Extension with Resistance - 1 x daily - 7 x weekly - 2 sets - 10 reps  Patient Education Shoulder Impingement

## 2020-06-09 NOTE — Therapy (Signed)
[facility] [address], [place], [place], [address] Phone: [phone]   Fax:  [phone]  Physical Therapy Treatment  Patient Details  Name: Ashley Griffith MRN: [id number] Date of Birth: 1945/06/30 Referring Provider (PT): Dr. Netta Cedars   Encounter Date: 06/09/2020   PT End of Session - 06/09/20 1624    Visit Number 3    Date for PT Re-Evaluation 08/12/20    Authorization Type [employer]na medicare    Authorization - Visit Number 3    Authorization - Number of Visits 10    PT Start Time 3419    PT Stop Time 6222    PT Time Calculation (min) 45 min    Activity Tolerance Patient tolerated treatment well;No increased pain    Behavior During Therapy WFL for tasks assessed/performed           Past Medical History:  Diagnosis Date  . Anxiety   . Arthritis   . Blood clot in vein    leg 11-23-1004  . Depression   . Depression   . DVT (deep venous thrombosis) ([place]) 1996  . GERD (gastroesophageal reflux disease)   . Hemorrhoids    Patient notes intermittent rectal bleeding from these  . High cholesterol   . Hypothyroidism    Seen by Dr. Wilson Singer  . Obesity    Status post significant intentional weight loss in 2003 of > 90 pounds  . Osteoarthritis   . Osteopenia   . PONV (postoperative nausea and vomiting)   . Psoriatic arthritis ([place])    Follows with Dr. Naida Sleight  . Pulmonary embolism ([place]) march 1995  . Rosacea     Past Surgical History:  Procedure Laterality Date  . blood mass removed from abdomen  1972   ? questionable ectopic pregnancy  . cataract surgery  2006 and 2007  . childbirth  1976  . colonscopy     x 2  . KNEE ARTHROSCOPY Right 09/18/2012   Procedure: RIGHT ARTHROSCOPY KNEE, Tlateral meniscal debdridement and chondroplasty;  Surgeon: Gearlean Alf, MD;  [facility]: [facility];  Service: Orthopedics;  Laterality: Right;  . LUMBAR LAMINECTOMY/DECOMPRESSION MICRODISCECTOMY Right 09/16/2015    Procedure: Right Lumbar Four-FiveLaminectomy with resection of synovial cyst;  Surgeon: Erline Levine, MD;  Location: [facility];  Service: Neurosurgery;  Laterality: Right;  right  . nasal poly removed  1964  . OPEN REDUCTION INTERNAL FIXATION (ORIF) METACARPAL Right 08/21/2018   Procedure: OPEN REDUCTION INTERNAL FIXATION (ORIF) AND REPAIR AS INDICATED  RIGHT 5TH METACARPAL;  Surgeon: Iran Planas, MD;  Location: [facility];  Service: Orthopedics;  Laterality: Right;  26min  . sclorotomy both legs veins  2005 or 2006  . SPHINCTEROTOMY  1990's  . superficial keratotomy  2021  . TONSILLECTOMY  as child  . torn meniscus right knee  2014  . TRANSFORAMINAL LUMBAR INTERBODY FUSION (TLIF) WITH PEDICLE SCREW FIXATION 1 LEVEL Left 05/02/2018   Procedure: Left Lumbar four-five Transforaminal lumbar interbody fusion;  Surgeon: Erline Levine, MD;  Location: [facility];  Service: Neurosurgery;  Laterality: Left;  Left Lumbar four-five Transforaminal lumbar interbody fusion    There were no vitals filed for this visit.   Subjective Assessment - 06/09/20 1536    Subjective I cut my Rt finger and had to get stitches.    Currently in Pain? Yes    Pain Score 4     Pain Location Neck    Pain Orientation Right    Pain Descriptors /  Indicators Aching;Dull    Pain Type Acute pain    Pain Onset More than a month ago    Pain Frequency Intermittent    Aggravating Factors  turning head, lifting Rt arm to the side    Pain Relieving Factors heat, Tylenol                             [facility] Adult PT Treatment/Exercise - 06/09/20 0001      Exercises   Exercises Neck      Shoulder Exercises: Standing   Extension Strengthening;20 reps;Both;Theraband    Row Strengthening;20 reps;Theraband;Both    Theraband Level (Shoulder Row) Level 2 (Red)      Manual Therapy   Manual Therapy Soft tissue mobilization    Manual therapy comments monitoring and assessment during dry needling.     Soft tissue mobilization elongation and trigger point release to Rt upper traps, levator and cervical/thoracic paraspinals      Neck Exercises: Stretches   Upper Trapezius Stretch Right;3 reps;20 seconds    Other Neck Stretches cervical rotation to the Lt 3x20 seconds            Trigger Point Dry Needling - 06/09/20 0001    Consent Given? Yes    Education Handout Provided Previously provided    Muscles Treated Head and Neck Upper trapezius;Suboccipitals;Levator scapulae   Rt only   Dry Needling Comments thoracic multifidi T1-4 on Rt    Upper Trapezius Response Twitch reponse elicited;Palpable increased muscle length    Suboccipitals Response Twitch response elicited;Palpable increased muscle length    Levator Scapulae Response Twitch response elicited;Palpable increased muscle length                PT Education - 06/09/20 1548    Education Details Access Code: [email or web address]    Person(s) Educated Patient    Methods Explanation;Demonstration;Handout    Comprehension Verbalized understanding;Returned demonstration            PT Short Term Goals - 05/20/20 1511      PT SHORT TERM GOAL #1   Title independent with initial HEP for cervical and right shoulder    Time 4    Status New    Target Date 06/17/20      PT SHORT TERM GOAL #2   Title cervical pain decreased >/= 25% so she is able to move her neck with greater ease    Time 4    Period Weeks    Status New    Target Date 06/17/20      PT SHORT TERM GOAL #3   Title right shoulder pain decreased >/= 25% with reaching overhead and behind her back due to improved mobility    Time 4    Period Weeks    Status New    Target Date 06/17/20             PT Long Term Goals - 05/20/20 1447      PT LONG TERM GOAL #1   Title independent with advanced HEP    Time 12    Period Weeks    Status New    Target Date 08/12/20      PT LONG TERM GOAL #2   Title cervical pain decreased >/= 50% so she is able to perform daily  activitis with greater ease    Time 12    Period Weeks    Status New    Target Date 08/12/20  PT LONG TERM GOAL #3   Title reach behind back to T5 so she is able to wash or scratch her back with minimal difficulty    Time 12    Period Weeks    Status New    Target Date 08/12/20      PT LONG TERM GOAL #4   Title shoulder flexion and abduction increased by 15-20 degrees actively so she is able to perform overhead activites with greater ease    Time 12    Period Weeks    Status New    Target Date 08/12/20      PT LONG TERM GOAL #5   Title able to turn her head with pain decreased >/= 50% due to pain reduced </= 2/10 so she is able to look behind her while driving    Time 12    Period Weeks    Status New    Target Date 08/12/20                 Plan - 06/09/20 1622    Clinical Impression Statement Pt arrived with report of tension and pain in Rt upper traps and distal to the glenohumeral joint on the Rt.  Pt declined doing ionto again as she was not able to sleep well that night and feels the patch was a contributing factor.  Pt required tactile and verbal cues for scapular depression with standing band exercises and for alignment with cervical flexibility.  Pt had trigger points in Rt upper traps and levator and demonstrated improved tissue mobility and reduced tension after manual therapy and dry needling today.  Pt will continue to benefit from skilled PT to address Rt UE and neck pain, postural alignment and endurance and flexibility for neck and shoulder.    PT Frequency 2x / week    PT Duration 12 weeks    PT Treatment/Interventions Cryotherapy;Electrical Stimulation;Iontophoresis 4mg /ml Dexamethasone;Moist Heat;Traction;Ultrasound;Neuromuscular re-education;Therapeutic exercise;Therapeutic activities;Patient/family education;Manual techniques;Dry needling;Passive range of motion;Taping;Joint Manipulations    PT Next Visit Plan review HEP issued today.  Assess response  to dry needling today and continue if helpful.  Joint mobs to Rt shoulder    PT Home Exercise Plan Access Code: [email or web address]    Consulted and Agree with Plan of Care Patient           Patient will benefit from skilled therapeutic intervention in order to improve the following deficits and impairments:  Decreased range of motion,Increased fascial restricitons,Decreased endurance,Increased muscle spasms,Decreased activity tolerance,Pain,Impaired flexibility,Decreased mobility,Decreased strength  Visit Diagnosis: Muscle weakness (generalized)  Acute pain of right shoulder  Stiffness of right shoulder, not elsewhere classified  Cervicalgia     Problem List Patient Active Problem List   Diagnosis Date Noted  . Spondylolisthesis of lumbar region 05/02/2018  . VTE (venous thromboembolism) 09/05/2016  . Bilateral asymmetric sensorineural hearing loss 08/23/2016  . Subacute cough 06/08/2016  . DOE (dyspnea on exertion) 06/08/2016  . Synovial cyst of lumbar facet joint 09/16/2015    Sigurd Sos, PT 06/09/20 4:26 PM  [facility] [address], [place], [place], [address] Phone: [phone]   Fax:  [phone]  Name: SHAKIRRA BUEHLER MRN: [id number] Date of Birth: 1945/10/04

## 2020-06-16 DIAGNOSIS — L089 Local infection of the skin and subcutaneous tissue, unspecified: Secondary | ICD-10-CM | POA: Diagnosis not present

## 2020-06-16 DIAGNOSIS — Z4802 Encounter for removal of sutures: Secondary | ICD-10-CM | POA: Diagnosis not present

## 2020-06-17 ENCOUNTER — Other Ambulatory Visit: Payer: Self-pay

## 2020-06-17 ENCOUNTER — Encounter: Payer: Self-pay | Admitting: Physical Therapy

## 2020-06-17 ENCOUNTER — Ambulatory Visit: Payer: Medicare HMO | Admitting: Physical Therapy

## 2020-06-17 DIAGNOSIS — M542 Cervicalgia: Secondary | ICD-10-CM

## 2020-06-17 DIAGNOSIS — M25611 Stiffness of right shoulder, not elsewhere classified: Secondary | ICD-10-CM

## 2020-06-17 DIAGNOSIS — M25511 Pain in right shoulder: Secondary | ICD-10-CM

## 2020-06-17 DIAGNOSIS — M6281 Muscle weakness (generalized): Secondary | ICD-10-CM

## 2020-06-17 NOTE — Therapy (Signed)
[facility] [address], [place], [place], [address] Phone: [phone]   Fax:  [phone]  Physical Therapy Treatment  Patient Details  Name: Ashley Griffith MRN: [id number] Date of Birth: Apr 07, 1946 Referring Provider (PT): Dr. Netta Cedars   Encounter Date: 06/17/2020   PT End of Session - 06/17/20 1441    Visit Number 4    Date for PT Re-Evaluation 08/12/20    Authorization Type [employer] medicare    Authorization - Visit Number 4    Authorization - Number of Visits 10    PT Start Time 1400    PT Stop Time 9390    PT Time Calculation (min) 38 min    Activity Tolerance Patient tolerated treatment well;No increased pain    Behavior During Therapy WFL for tasks assessed/performed           Past Medical History:  Diagnosis Date  . Anxiety   . Arthritis   . Blood clot in vein    leg 11-23-1004  . Depression   . Depression   . DVT (deep venous thrombosis) ([place]) 1996  . GERD (gastroesophageal reflux disease)   . Hemorrhoids    Patient notes intermittent rectal bleeding from these  . High cholesterol   . Hypothyroidism    Seen by Dr. Wilson Singer  . Obesity    Status post significant intentional weight loss in 2003 of > 90 pounds  . Osteoarthritis   . Osteopenia   . PONV (postoperative nausea and vomiting)   . Psoriatic arthritis ([place])    Follows with Dr. Naida Sleight  . Pulmonary embolism ([place]) march 1995  . Rosacea     Past Surgical History:  Procedure Laterality Date  . blood mass removed from abdomen  1972   ? questionable ectopic pregnancy  . cataract surgery  2006 and 2007  . childbirth  1976  . colonscopy     x 2  . KNEE ARTHROSCOPY Right 09/18/2012   Procedure: RIGHT ARTHROSCOPY KNEE, Tlateral meniscal debdridement and chondroplasty;  Surgeon: Gearlean Alf, MD;  Location: [facility];  Service: Orthopedics;  Laterality: Right;  . LUMBAR LAMINECTOMY/DECOMPRESSION MICRODISCECTOMY Right 09/16/2015    Procedure: Right Lumbar Four-FiveLaminectomy with resection of synovial cyst;  Surgeon: Erline Levine, MD;  Location: [facility];  Service: Neurosurgery;  Laterality: Right;  right  . nasal poly removed  1964  . OPEN REDUCTION INTERNAL FIXATION (ORIF) METACARPAL Right 08/21/2018   Procedure: OPEN REDUCTION INTERNAL FIXATION (ORIF) AND REPAIR AS INDICATED  RIGHT 5TH METACARPAL;  Surgeon: Iran Planas, MD;  Location: [facility];  Service: Orthopedics;  Laterality: Right;  106min  . sclorotomy both legs veins  2005 or 2006  . SPHINCTEROTOMY  1990's  . superficial keratotomy  2021  . TONSILLECTOMY  as child  . torn meniscus right knee  2014  . TRANSFORAMINAL LUMBAR INTERBODY FUSION (TLIF) WITH PEDICLE SCREW FIXATION 1 LEVEL Left 05/02/2018   Procedure: Left Lumbar four-five Transforaminal lumbar interbody fusion;  Surgeon: Erline Levine, MD;  Location: [facility];  Service: Neurosurgery;  Laterality: Left;  Left Lumbar four-five Transforaminal lumbar interbody fusion    There were no vitals filed for this visit.   Subjective Assessment - 06/17/20 1402    Subjective I hurt today. The exercises I was given to look over my shoulder and they hurt.    Patient Stated Goals reduce the pain    Currently in Pain? Yes    Pain Score 4  Pain Location Neck    Pain Orientation Right    Pain Descriptors / Indicators Aching;Dull    Pain Type Acute pain    Pain Onset More than a month ago    Pain Frequency Intermittent    Aggravating Factors  turning head, lifting right arm to the side    Pain Relieving Factors heat, tylenos    Multiple Pain Sites No              [facility] PT Assessment - 06/17/20 0001      AROM   Right Shoulder Flexion 165 Degrees   sitting   Right Shoulder ABduction 135 Degrees   sitting                        [facility] Adult PT Treatment/Exercise - 06/17/20 0001      Neck Exercises: Seated   Other Seated Exercise seated cervical rotation and sidebending 5  time each one just moving where there is a stretch    Other Seated Exercise bilateral shoulder flexion 5x; right shoulder abduction 5x      Shoulder Exercises: Supine   Flexion AAROM;Right;10 reps    Flexion Limitations monitoring for pain    ABduction AAROM;Right;10 reps    ABduction Limitations monitoring for pain      Manual Therapy   Manual Therapy Joint mobilization;Soft tissue mobilization    Manual therapy comments monitoring and assessment during dry needling.    Joint Mobilization side glide to C3-C7;    Soft tissue mobilization elongation and trigger point release to Rt upper traps, levator and cervical/thoracic paraspinals; manual work to right serratus anterior and pectorlis            Trigger Point Dry Needling - 06/17/20 0001    Consent Given? Yes    Education Handout Provided Previously provided    Muscles Treated Head and Neck Upper trapezius;Cervical multifidi   right   Dry Needling Comments thoracic multifidi T1-4 on Rt    Upper Trapezius Response Twitch reponse elicited;Palpable increased muscle length    Cervical multifidi Response Twitch reponse elicited;Palpable increased muscle length                  PT Short Term Goals - 06/17/20 1447      PT SHORT TERM GOAL #1   Title independent with initial HEP for cervical and right shoulder    Time 4    Status Achieved    Target Date 06/17/20             PT Long Term Goals - 05/20/20 1447      PT LONG TERM GOAL #1   Title independent with advanced HEP    Time 12    Period Weeks    Status New    Target Date 08/12/20      PT LONG TERM GOAL #2   Title cervical pain decreased >/= 50% so she is able to perform daily activitis with greater ease    Time 12    Period Weeks    Status New    Target Date 08/12/20      PT LONG TERM GOAL #3   Title reach behind back to T5 so she is able to wash or scratch her back with minimal difficulty    Time 12    Period Weeks    Status New    Target Date  08/12/20      PT LONG TERM GOAL #4  Title shoulder flexion and abduction increased by 15-20 degrees actively so she is able to perform overhead activites with greater ease    Time 12    Period Weeks    Status New    Target Date 08/12/20      PT LONG TERM GOAL #5   Title able to turn her head with pain decreased >/= 50% due to pain reduced </= 2/10 so she is able to look behind her while driving    Time 12    Period Weeks    Status New    Target Date 08/12/20                 Plan - 06/17/20 1441    Clinical Impression Statement Patient is anxious due to still having pain. Therapist explained pain and how gaining new motion can be painful due to the muscles and joints are not used to the new range. Patient right shoulder flexion increased from 135 to 165 degrees actively. Her right shoulder abduction has increased from 122 to 135 degress actively. Patient had some trigger points released with the dry needling in the right upper trap and cervical multifidi. Patient is now able to lay on her right shoulder but does not stay in that position. Patient will benefit from skilled therapy to address right upper extremity and neck pain, postural alignment, and endurance and flexibility for neck and shoulder.    Personal Factors and Comorbidities Age;Fitness;Comorbidity 3+    Comorbidities right carpal tunnel release 02/09/2020; psoriatic arthritis; osteopenia; hypothyroidism; pulmonary embolism    Examination-Activity Limitations Bathing;Bed Mobility;Reach Overhead;Dressing;Lift    Examination-Participation Restrictions Meal Prep;Cleaning;Community Activity;Driving;Laundry    Stability/Clinical Decision Making Evolving/Moderate complexity    Rehab Potential Excellent    PT Frequency 2x / week    PT Duration 12 weeks    PT Treatment/Interventions Cryotherapy;Electrical Stimulation;Iontophoresis 4mg /ml Dexamethasone;Moist Heat;Traction;Ultrasound;Neuromuscular re-education;Therapeutic  exercise;Therapeutic activities;Patient/family education;Manual techniques;Dry needling;Passive range of motion;Taping;Joint Manipulations    PT Next Visit Plan go slowly with right shoulder ROM; work on serratus anterior and scapular muscles to reduce forward shoulders; continue with dry needling if helpful    PT Home Exercise Plan Access Code: [email or web address]    Consulted and Agree with Plan of Care Patient           Patient will benefit from skilled therapeutic intervention in order to improve the following deficits and impairments:  Decreased range of motion,Increased fascial restricitons,Decreased endurance,Increased muscle spasms,Decreased activity tolerance,Pain,Impaired flexibility,Decreased mobility,Decreased strength  Visit Diagnosis: Muscle weakness (generalized)  Acute pain of right shoulder  Stiffness of right shoulder, not elsewhere classified  Cervicalgia     Problem List Patient Active Problem List   Diagnosis Date Noted  . Spondylolisthesis of lumbar region 05/02/2018  . VTE (venous thromboembolism) 09/05/2016  . Bilateral asymmetric sensorineural hearing loss 08/23/2016  . Subacute cough 06/08/2016  . DOE (dyspnea on exertion) 06/08/2016  . Synovial cyst of lumbar facet joint 09/16/2015    Earlie Counts, PT 06/17/20 2:48 PM   [facility] [address] [place], [place], [address] Phone: [phone]   Fax:  [phone]  Name: Ashley Griffith MRN: [id number] Date of Birth: 1946-01-04

## 2020-06-18 DIAGNOSIS — M65831 Other synovitis and tenosynovitis, right forearm: Secondary | ICD-10-CM | POA: Diagnosis not present

## 2020-06-21 ENCOUNTER — Ambulatory Visit: Payer: Medicare HMO

## 2020-06-21 ENCOUNTER — Other Ambulatory Visit: Payer: Self-pay

## 2020-06-21 DIAGNOSIS — M542 Cervicalgia: Secondary | ICD-10-CM

## 2020-06-21 DIAGNOSIS — M6281 Muscle weakness (generalized): Secondary | ICD-10-CM | POA: Diagnosis not present

## 2020-06-21 DIAGNOSIS — M25611 Stiffness of right shoulder, not elsewhere classified: Secondary | ICD-10-CM

## 2020-06-21 DIAGNOSIS — M25511 Pain in right shoulder: Secondary | ICD-10-CM | POA: Diagnosis not present

## 2020-06-21 NOTE — Therapy (Signed)
[facility] [address] [place], [place], [address] Phone: [phone]   Fax:  [phone]  Physical Therapy Treatment  Patient Details  Name: Ashley Griffith MRN: [id number] Date of Birth: Sep 08, 1945 Referring Provider (PT): Dr. Netta Cedars   Encounter Date: 06/21/2020   PT End of Session - 06/21/20 1144    Visit Number 5    Date for PT Re-Evaluation 08/12/20    Authorization Type [employer] medicare    Authorization - Visit Number 5    Authorization - Number of Visits 10    PT Start Time 0177    PT Stop Time 9390    PT Time Calculation (min) 41 min    Activity Tolerance Patient tolerated treatment well;No increased pain    Behavior During Therapy WFL for tasks assessed/performed           Past Medical History:  Diagnosis Date  . Anxiety   . Arthritis   . Blood clot in vein    leg 11-23-1004  . Depression   . Depression   . DVT (deep venous thrombosis) ([place]) 1996  . GERD (gastroesophageal reflux disease)   . Hemorrhoids    Patient notes intermittent rectal bleeding from these  . High cholesterol   . Hypothyroidism    Seen by Dr. Wilson Singer  . Obesity    Status post significant intentional weight loss in 2003 of > 90 pounds  . Osteoarthritis   . Osteopenia   . PONV (postoperative nausea and vomiting)   . Psoriatic arthritis ([place])    Follows with Dr. Naida Sleight  . Pulmonary embolism ([place]) march 1995  . Rosacea     Past Surgical History:  Procedure Laterality Date  . blood mass removed from abdomen  1972   ? questionable ectopic pregnancy  . cataract surgery  2006 and 2007  . childbirth  1976  . colonscopy     x 2  . KNEE ARTHROSCOPY Right 09/18/2012   Procedure: RIGHT ARTHROSCOPY KNEE, Tlateral meniscal debdridement and chondroplasty;  Surgeon: Gearlean Alf, MD;  [facility];  Service: Orthopedics;  Laterality: Right;  . LUMBAR LAMINECTOMY/DECOMPRESSION MICRODISCECTOMY Right 09/16/2015    Procedure: Right Lumbar Four-FiveLaminectomy with resection of synovial cyst;  Surgeon: Erline Levine, MD;  Location: [facility];  Service: Neurosurgery;  Laterality: Right;  right  . nasal poly removed  1964  . OPEN REDUCTION INTERNAL FIXATION (ORIF) METACARPAL Right 08/21/2018   Procedure: OPEN REDUCTION INTERNAL FIXATION (ORIF) AND REPAIR AS INDICATED  RIGHT 5TH METACARPAL;  Surgeon: Iran Planas, MD;  Location: [facility];  Service: Orthopedics;  Laterality: Right;  71min  . sclorotomy both legs veins  2005 or 2006  . SPHINCTEROTOMY  1990's  . superficial keratotomy  2021  . TONSILLECTOMY  as child  . torn meniscus right knee  2014  . TRANSFORAMINAL LUMBAR INTERBODY FUSION (TLIF) WITH PEDICLE SCREW FIXATION 1 LEVEL Left 05/02/2018   Procedure: Left Lumbar four-five Transforaminal lumbar interbody fusion;  Surgeon: Erline Levine, MD;  Location: [facility];  Service: Neurosurgery;  Laterality: Left;  Left Lumbar four-five Transforaminal lumbar interbody fusion    There were no vitals filed for this visit.   Subjective Assessment - 06/21/20 1107    Subjective I fell up the steps on Saturday and hit my Rt arm.    Currently in Pain? No/denies  [facility] Adult PT Treatment/Exercise - 06/21/20 0001      Exercises   Exercises Shoulder      Neck Exercises: Machines for Strengthening   UBE (Upper Arm Bike) Level 1x 4 minutes (2/2)-PT present to discuss progress with pt      Shoulder Exercises: Seated   External Rotation Strengthening;Right;20 reps;Theraband    Theraband Level (Shoulder External Rotation) Other (comment)   green loop   Abduction Strengthening;10 reps;Limitations    ABduction Limitations bent elbow    Other Seated Exercises biceps curls: 2# 2x10    Other Seated Exercises UE ranger reaching out to the side x 15      Shoulder Exercises: Standing   Extension Strengthening;20 reps;Both;Theraband    Theraband Level (Shoulder  Extension) Level 2 (Red)    Row Strengthening;20 reps;Theraband;Both    Theraband Level (Shoulder Row) Level 2 (Red)      Shoulder Exercises: Pulleys   Flexion 3 minutes    ABduction 3 minutes                    PT Short Term Goals - 06/21/20 1122      PT SHORT TERM GOAL #1   Title independent with initial HEP for cervical and right shoulder    Status On-going      PT SHORT TERM GOAL #2   Title cervical pain decreased >/= 25% so she is able to move her neck with greater ease    Status On-going      PT SHORT TERM GOAL #3   Title right shoulder pain decreased >/= 25% with reaching overhead and behind her back due to improved mobility    Status On-going             PT Long Term Goals - 05/20/20 1447      PT LONG TERM GOAL #1   Title independent with advanced HEP    Time 12    Period Weeks    Status New    Target Date 08/12/20      PT LONG TERM GOAL #2   Title cervical pain decreased >/= 50% so she is able to perform daily activitis with greater ease    Time 12    Period Weeks    Status New    Target Date 08/12/20      PT LONG TERM GOAL #3   Title reach behind back to T5 so she is able to wash or scratch her back with minimal difficulty    Time 12    Period Weeks    Status New    Target Date 08/12/20      PT LONG TERM GOAL #4   Title shoulder flexion and abduction increased by 15-20 degrees actively so she is able to perform overhead activites with greater ease    Time 12    Period Weeks    Status New    Target Date 08/12/20      PT LONG TERM GOAL #5   Title able to turn her head with pain decreased >/= 50% due to pain reduced </= 2/10 so she is able to look behind her while driving    Time 12    Period Weeks    Status New    Target Date 08/12/20                 Plan - 06/21/20 1136    Clinical Impression Statement Pt arrived without pain today.  Pt continues to report Rt  lateral shoulder pain with reaching out to pick up an object.   Pt has been compliant with her HEP and has made modifications to her HEP as instructed by PT last session to perform more gently.  Pt wants to return to light weight training at the gym and PT instructed pt on how to progress upper body exercises at home and verbal and tactile cues for postural alignment and speed of motion.  Pt experienced pain with lateral raises and PT modified to perform with short arc and no weight.  Pt did well with advancement of activity with addition of weights and arm bike today.  Pt requires tactile and verbal cues to reduce trunk extension with standing exercise.  Pt will continue to benefit from skilled PT to address Rt arm and cervical pain.    PT Frequency 2x / week    PT Duration 12 weeks    PT Treatment/Interventions Cryotherapy;Electrical Stimulation;Iontophoresis 4mg /ml Dexamethasone;Moist Heat;Traction;Ultrasound;Neuromuscular re-education;Therapeutic exercise;Therapeutic activities;Patient/family education;Manual techniques;Dry needling;Passive range of motion;Taping;Joint Manipulations    PT Next Visit Plan go slowly with right shoulder ROM; work on serratus anterior and scapular muscles to reduce forward shoulders; continue with dry needling if helpful    PT Home Exercise Plan Access Code: [email or web address]           Patient will benefit from skilled therapeutic intervention in order to improve the following deficits and impairments:  Decreased range of motion,Increased fascial restricitons,Decreased endurance,Increased muscle spasms,Decreased activity tolerance,Pain,Impaired flexibility,Decreased mobility,Decreased strength  Visit Diagnosis: Muscle weakness (generalized)  Acute pain of right shoulder  Stiffness of right shoulder, not elsewhere classified  Cervicalgia     Problem List Patient Active Problem List   Diagnosis Date Noted  . Spondylolisthesis of lumbar region 05/02/2018  . VTE (venous thromboembolism) 09/05/2016  . Bilateral asymmetric  sensorineural hearing loss 08/23/2016  . Subacute cough 06/08/2016  . DOE (dyspnea on exertion) 06/08/2016  . Synovial cyst of lumbar facet joint 09/16/2015    Sigurd Sos, PT 06/21/20 11:47 AM  [facility] [address] [place], [place], [address] Phone: [phone]   Fax:  [phone]  Name: Ashley Griffith MRN: [id number] Date of Birth: 1945-08-19

## 2020-06-23 ENCOUNTER — Other Ambulatory Visit: Payer: Self-pay

## 2020-06-23 ENCOUNTER — Encounter: Payer: Self-pay | Admitting: Physical Therapy

## 2020-06-23 ENCOUNTER — Ambulatory Visit: Payer: Medicare HMO | Attending: Orthopedic Surgery | Admitting: Physical Therapy

## 2020-06-23 DIAGNOSIS — M542 Cervicalgia: Secondary | ICD-10-CM | POA: Diagnosis not present

## 2020-06-23 DIAGNOSIS — M25611 Stiffness of right shoulder, not elsewhere classified: Secondary | ICD-10-CM | POA: Insufficient documentation

## 2020-06-23 DIAGNOSIS — M25511 Pain in right shoulder: Secondary | ICD-10-CM | POA: Insufficient documentation

## 2020-06-23 DIAGNOSIS — M6281 Muscle weakness (generalized): Secondary | ICD-10-CM | POA: Insufficient documentation

## 2020-06-23 NOTE — Therapy (Signed)
[facility] [address] [place], [place], [address] Phone: [phone]   Fax:  [phone]  Physical Therapy Treatment  Patient Details  Name: Ashley Griffith MRN: [id number] Date of Birth: October 26, 1945 Referring Provider (PT): Dr. Netta Cedars   Encounter Date: 06/23/2020   PT End of Session - 06/23/20 1240    Visit Number 6    Date for PT Re-Evaluation 08/12/20    Authorization Type [employer]na medicare    Authorization - Visit Number 6    Authorization - Number of Visits 10    PT Start Time 6962    PT Stop Time 1300   left early due to not feeling well   PT Time Calculation (min) 25 min    Activity Tolerance Patient tolerated treatment well;No increased pain    Behavior During Therapy WFL for tasks assessed/performed           Past Medical History:  Diagnosis Date  . Anxiety   . Arthritis   . Blood clot in vein    leg 11-23-1004  . Depression   . Depression   . DVT (deep venous thrombosis) ([place]) 1996  . GERD (gastroesophageal reflux disease)   . Hemorrhoids    Patient notes intermittent rectal bleeding from these  . High cholesterol   . Hypothyroidism    Seen by Dr. Wilson Singer  . Obesity    Status post significant intentional weight loss in 2003 of > 90 pounds  . Osteoarthritis   . Osteopenia   . PONV (postoperative nausea and vomiting)   . Psoriatic arthritis ([place])    Follows with Dr. Naida Sleight  . Pulmonary embolism ([place]) march 1995  . Rosacea     Past Surgical History:  Procedure Laterality Date  . blood mass removed from abdomen  1972   ? questionable ectopic pregnancy  . cataract surgery  2006 and 2007  . childbirth  1976  . colonscopy     x 2  . KNEE ARTHROSCOPY Right 09/18/2012   Procedure: RIGHT ARTHROSCOPY KNEE, Tlateral meniscal debdridement and chondroplasty;  Surgeon: Gearlean Alf, MD;  Location: [facility];  Service: Orthopedics;  Laterality: Right;  . LUMBAR LAMINECTOMY/DECOMPRESSION  MICRODISCECTOMY Right 09/16/2015   Procedure: Right Lumbar Four-FiveLaminectomy with resection of synovial cyst;  Surgeon: Erline Levine, MD;  Location: [facility];  Service: Neurosurgery;  Laterality: Right;  right  . nasal poly removed  1964  . OPEN REDUCTION INTERNAL FIXATION (ORIF) METACARPAL Right 08/21/2018   Procedure: OPEN REDUCTION INTERNAL FIXATION (ORIF) AND REPAIR AS INDICATED  RIGHT 5TH METACARPAL;  Surgeon: Iran Planas, MD;  Location: [place];  Service: Orthopedics;  Laterality: Right;  78min  . sclorotomy both legs veins  2005 or 2006  . SPHINCTEROTOMY  1990's  . superficial keratotomy  2021  . TONSILLECTOMY  as child  . torn meniscus right knee  2014  . TRANSFORAMINAL LUMBAR INTERBODY FUSION (TLIF) WITH PEDICLE SCREW FIXATION 1 LEVEL Left 05/02/2018   Procedure: Left Lumbar four-five Transforaminal lumbar interbody fusion;  Surgeon: Erline Levine, MD;  Location: [place];  Service: Neurosurgery;  Laterality: Left;  Left Lumbar four-five Transforaminal lumbar interbody fusion    There were no vitals filed for this visit.   Subjective Assessment - 06/23/20 1237    Subjective I feel pretty bad today with my right arm. I feel the neck exercises are increasing my pain.    Patient Stated Goals reduce the pain    Currently in Pain?  Yes    Pain Score 5     Pain Location Neck    Pain Orientation Right    Pain Descriptors / Indicators Aching;Dull    Pain Type Acute pain    Pain Onset More than a month ago    Pain Frequency Intermittent    Aggravating Factors  turning head, lifting right arm to the side    Pain Relieving Factors heat, tylenol    Multiple Pain Sites No                             [facility] Adult PT Treatment/Exercise - 06/23/20 0001      Therapeutic Activites    Therapeutic Activities Other Therapeutic Activities    Other Therapeutic Activities stand against the wall with keeping upright, reduce forward head, and increased sway  back      Neck Exercises: Machines for Strengthening   UBE (Upper Arm Bike) Level 1x 4 minutes (2/2)-PT present to discuss progress with pt      Shoulder Exercises: Standing   Extension Strengthening;20 reps;Both;Theraband    Theraband Level (Shoulder Extension) Level 2 (Red)    Extension Limitations needs tactile cues to retract the scapula and tighten the abdominals to prevent sway back                    PT Short Term Goals - 06/23/20 1258      PT SHORT TERM GOAL #1   Title --             PT Long Term Goals - 05/20/20 1447      PT LONG TERM GOAL #1   Title independent with advanced HEP    Time 12    Period Weeks    Status New    Target Date 08/12/20      PT LONG TERM GOAL #2   Title cervical pain decreased >/= 50% so she is able to perform daily activitis with greater ease    Time 12    Period Weeks    Status New    Target Date 08/12/20      PT LONG TERM GOAL #3   Title reach behind back to T5 so she is able to wash or scratch her back with minimal difficulty    Time 12    Period Weeks    Status New    Target Date 08/12/20      PT LONG TERM GOAL #4   Title shoulder flexion and abduction increased by 15-20 degrees actively so she is able to perform overhead activites with greater ease    Time 12    Period Weeks    Status New    Target Date 08/12/20      PT LONG TERM GOAL #5   Title able to turn her head with pain decreased >/= 50% due to pain reduced </= 2/10 so she is able to look behind her while driving    Time 12    Period Weeks    Status New    Target Date 08/12/20                 Plan - 06/23/20 1303    Clinical Impression Statement Patient had to take several rests and end early due to feeling dizzy. Patient needed tactile and verbal cues to stand upright with her exercise and not pushing her hips forward and head forward. Patient has increased pain with cervical  sidebending so therapist took it out of the HEP. Patient continues  to have shoulder and neck pain. Patient will benefit from skilled therapy to address right arm and cervical pain.    Personal Factors and Comorbidities Age;Fitness;Comorbidity 3+    Comorbidities right carpal tunnel release 02/09/2020; psoriatic arthritis; osteopenia; hypothyroidism; pulmonary embolism    Examination-Activity Limitations Bathing;Bed Mobility;Reach Overhead;Dressing;Lift    Examination-Participation Restrictions Meal Prep;Cleaning;Community Activity;Driving;Laundry    Stability/Clinical Decision Making Evolving/Moderate complexity    Rehab Potential Excellent    PT Frequency 2x / week    PT Treatment/Interventions Cryotherapy;Electrical Stimulation;Iontophoresis 4mg /ml Dexamethasone;Moist Heat;Traction;Ultrasound;Neuromuscular re-education;Therapeutic exercise;Therapeutic activities;Patient/family education;Manual techniques;Dry needling;Passive range of motion;Taping;Joint Manipulations    PT Next Visit Plan go slowly with right shoulder ROM; work on serratus anterior and scapular muscles to reduce forward shoulders; continue with dry needling if helpful; assess patient to see if she is to continue    PT Home Exercise Plan Access Code: [email or web address]    Consulted and Agree with Plan of Care Patient           Patient will benefit from skilled therapeutic intervention in order to improve the following deficits and impairments:  Decreased range of motion,Increased fascial restricitons,Decreased endurance,Increased muscle spasms,Decreased activity tolerance,Pain,Impaired flexibility,Decreased mobility,Decreased strength  Visit Diagnosis: Muscle weakness (generalized)  Acute pain of right shoulder  Stiffness of right shoulder, not elsewhere classified  Cervicalgia     Problem List Patient Active Problem List   Diagnosis Date Noted  . Spondylolisthesis of lumbar region 05/02/2018  . VTE (venous thromboembolism) 09/05/2016  . Bilateral asymmetric sensorineural hearing loss  08/23/2016  . Subacute cough 06/08/2016  . DOE (dyspnea on exertion) 06/08/2016  . Synovial cyst of lumbar facet joint 09/16/2015    Earlie Counts, PT 06/23/20 1:12 PM   [facility] [address] [place], [place], [address] Phone: [phone]   Fax:  [phone]  Name: Ashley Griffith MRN: [id number] Date of Birth: [birth year]

## 2020-06-28 ENCOUNTER — Other Ambulatory Visit: Payer: Self-pay

## 2020-06-28 ENCOUNTER — Ambulatory Visit: Payer: Medicare HMO

## 2020-06-28 DIAGNOSIS — M1710 Unilateral primary osteoarthritis, unspecified knee: Secondary | ICD-10-CM | POA: Diagnosis not present

## 2020-06-28 DIAGNOSIS — M79641 Pain in right hand: Secondary | ICD-10-CM | POA: Diagnosis not present

## 2020-06-28 DIAGNOSIS — L4050 Arthropathic psoriasis, unspecified: Secondary | ICD-10-CM | POA: Diagnosis not present

## 2020-06-28 DIAGNOSIS — M6281 Muscle weakness (generalized): Secondary | ICD-10-CM | POA: Diagnosis not present

## 2020-06-28 DIAGNOSIS — M542 Cervicalgia: Secondary | ICD-10-CM | POA: Diagnosis not present

## 2020-06-28 DIAGNOSIS — E559 Vitamin D deficiency, unspecified: Secondary | ICD-10-CM | POA: Diagnosis not present

## 2020-06-28 DIAGNOSIS — L408 Other psoriasis: Secondary | ICD-10-CM | POA: Diagnosis not present

## 2020-06-28 DIAGNOSIS — Z79899 Other long term (current) drug therapy: Secondary | ICD-10-CM | POA: Diagnosis not present

## 2020-06-28 DIAGNOSIS — M25611 Stiffness of right shoulder, not elsewhere classified: Secondary | ICD-10-CM

## 2020-06-28 DIAGNOSIS — M25511 Pain in right shoulder: Secondary | ICD-10-CM | POA: Diagnosis not present

## 2020-06-28 DIAGNOSIS — M2550 Pain in unspecified joint: Secondary | ICD-10-CM | POA: Diagnosis not present

## 2020-06-28 DIAGNOSIS — M25531 Pain in right wrist: Secondary | ICD-10-CM | POA: Diagnosis not present

## 2020-06-28 NOTE — Therapy (Signed)
[facility] [address] [place], [place], [address] Phone: [phone]   Fax:  [phone]  Physical Therapy Treatment  Patient Details  Name: Ashley Griffith MRN: [id number] Date of Birth: 1945-05-09 Referring Provider (PT): Dr. Netta Cedars   Encounter Date: 06/28/2020   PT End of Session - 06/28/20 1511    Visit Number 7    Date for PT Re-Evaluation 08/12/20    Authorization Type A[employer]na medicare    Authorization - Visit Number 7    Authorization - Number of Visits 10    PT Start Time 6222    PT Stop Time 1525    PT Time Calculation (min) 40 min           Past Medical History:  Diagnosis Date  . Anxiety   . Arthritis   . Blood clot in vein    leg 11-23-1004  . Depression   . Depression   . DVT (deep venous thrombosis) ([place]) 1996  . GERD (gastroesophageal reflux disease)   . Hemorrhoids    Patient notes intermittent rectal bleeding from these  . High cholesterol   . Hypothyroidism    Seen by Dr. Wilson Singer  . Obesity    Status post significant intentional weight loss in 2003 of > 90 pounds  . Osteoarthritis   . Osteopenia   . PONV (postoperative nausea and vomiting)   . Psoriatic arthritis ([place])    Follows with Dr. Naida Sleight  . Pulmonary embolism ([place]) march 1995  . Rosacea     Past Surgical History:  Procedure Laterality Date  . blood mass removed from abdomen  1972   ? questionable ectopic pregnancy  . cataract surgery  2006 and 2007  . childbirth  1976  . colonscopy     x 2  . KNEE ARTHROSCOPY Right 09/18/2012   Procedure: RIGHT ARTHROSCOPY KNEE, Tlateral meniscal debdridement and chondroplasty;  Surgeon: Gearlean Alf, MD;  [facility];  Service: Orthopedics;  Laterality: Right;  . LUMBAR LAMINECTOMY/DECOMPRESSION MICRODISCECTOMY Right 09/16/2015   Procedure: Right Lumbar Four-FiveLaminectomy with resection of synovial cyst;  Surgeon: Erline Levine, MD;  Location: [facility];   Service: Neurosurgery;  Laterality: Right;  right  . nasal poly removed  1964  . OPEN REDUCTION INTERNAL FIXATION (ORIF) METACARPAL Right 08/21/2018   Procedure: OPEN REDUCTION INTERNAL FIXATION (ORIF) AND REPAIR AS INDICATED  RIGHT 5TH METACARPAL;  Surgeon: Iran Planas, MD;  Location: [facility];  Service: Orthopedics;  Laterality: Right;  37min  . sclorotomy both legs veins  2005 or 2006  . SPHINCTEROTOMY  1990's  . superficial keratotomy  2021  . TONSILLECTOMY  as child  . torn meniscus right knee  2014  . TRANSFORAMINAL LUMBAR INTERBODY FUSION (TLIF) WITH PEDICLE SCREW FIXATION 1 LEVEL Left 05/02/2018   Procedure: Left Lumbar four-five Transforaminal lumbar interbody fusion;  Surgeon: Erline Levine, MD;  Location: [place] [facility];  Service: Neurosurgery;  Laterality: Left;  Left Lumbar four-five Transforaminal lumbar interbody fusion    There were no vitals filed for this visit.   Subjective Assessment - 06/28/20 1451    Subjective Patient reports increased fatigue and nausea starting during previus session. States that symptoms resovled by Saturday and that she is COVID (-). Has not completed HEP and continues to feel that cervical exercises are causing more harm than good. Has made a conscious effort to sit up taller throughout the day and has noticed increased soreness across posterior shoulders. Notices that she  has to assist hr neck when attempting to sit up off of the pillow in the morning. Had rheumatology f/u today and received injections into her hand to address pain and swelling. States that Dr. Veverly Fells has dismissed her case as he does not feel that shoulder pathology is the cause of pain. Feels that exercises helped initially but are no longer helpful as she has progressed.    Currently in Pain? Yes    Pain Onset More than a month ago              [facility] PT Assessment - 06/28/20 0001      Assessment   Medical Diagnosis M75.41 impingement syndrome of right shoulder     Referring Provider (PT) Dr. Netta Cedars    Onset Date/Surgical Date 03/24/20      Cognition   Overall Cognitive Status Within Functional Limits for tasks assessed      Observation/Other Assessments   Focus on Therapeutic Outcomes (FOTO)  52      AROM   Right Shoulder Flexion 165 Degrees   sitting   Right Shoulder ABduction 135 Degrees   sitting                        [facility] Adult PT Treatment/Exercise - 06/28/20 0001      Manual Therapy   Manual Therapy Joint mobilization;Soft tissue mobilization   Simultaneous filing. User may not have seen previous data.   Soft tissue mobilization elongation and trigger point release to Rt and Lt upper traps, levator and cervical/thoracic paraspinals; manual work to right serratus anterior                  PT Education - 06/28/20 1513    Education Details Access Code: [email or web address]; removed cervical stretch and cervical retraction from HEP    Person(s) Educated Patient    Methods Explanation    Comprehension Verbalized understanding            PT Short Term Goals - 06/23/20 1258      PT SHORT TERM GOAL #1   Title --             PT Long Term Goals - 06/28/20 1458      PT LONG TERM GOAL #1   Title independent with advanced HEP    Status Achieved      PT LONG TERM GOAL #2   Title cervical pain decreased >/= 50% so she is able to perform daily activities with greater ease    Baseline exercises seem to make pain worse- stopped exercises    Status Not Met      PT LONG TERM GOAL #3   Title reach behind back to T5 so she is able to wash or scratch her back with minimal difficulty    Baseline T8    Status Partially Met      PT LONG TERM GOAL #4   Title shoulder flexion and abduction increased by 15-20 degrees actively so she is able to perform overhead activites with greater ease    Status Achieved      PT LONG TERM GOAL #5   Title able to turn her head with pain decreased >/= 50% due to pain reduced </= 2/10  so she is able to look behind her while driving    Baseline 2/50 with driving    Status Partially Met  Plan - 06/28/20 1626    Clinical Impression Statement Pt arrived today with report of no change in symptoms since the start of care and that exercises are increasing her pain.  She saw her rheumatologist and got a wrist injection today.  Session focused on final goal assessment and manual therapy to address neck pain.  Pt will follow-up with MD to discuss lack of progress.  PT discussed how to incorporate exercises back into routine if her pain reduces.    PT Next Visit Plan D/C PT to HEP.  Pt will follow-up with MD to discuss lack of progress    PT Home Exercise Plan Access Code: [email or web address]    Consulted and Agree with Plan of Care Patient           Patient will benefit from skilled therapeutic intervention in order to improve the following deficits and impairments:     Visit Diagnosis: Muscle weakness (generalized)  Acute pain of right shoulder  Stiffness of right shoulder, not elsewhere classified  Cervicalgia     Problem List Patient Active Problem List   Diagnosis Date Noted  . Spondylolisthesis of lumbar region 05/02/2018  . VTE (venous thromboembolism) 09/05/2016  . Bilateral asymmetric sensorineural hearing loss 08/23/2016  . Subacute cough 06/08/2016  . DOE (dyspnea on exertion) 06/08/2016  . Synovial cyst of lumbar facet joint 09/16/2015   PHYSICAL THERAPY DISCHARGE SUMMARY  Visits from Start of Care: 7  Current functional level related to goals / functional outcomes: See above for current status.    Remaining deficits: Pt with continued Rt UE pain and neck pain that is unchanged.  Pt will follow-up with MD.      Education / Equipment: HEP, posture/body mechanics Plan: Patient agrees to discharge.  Patient goals were partially met. Patient is being discharged due to lack of progress.  ?????        Sigurd Sos, PT 06/28/20  4:28 PM  [facility] [address], [place], [place], [address] Phone: [phone]   Fax:  [phone]  Name: Ashley Griffith MRN: [id number] Date of Birth: April 07, 1946

## 2020-06-30 ENCOUNTER — Ambulatory Visit: Payer: Medicare HMO

## 2020-07-08 DIAGNOSIS — R1031 Right lower quadrant pain: Secondary | ICD-10-CM | POA: Diagnosis not present

## 2020-07-13 DIAGNOSIS — R109 Unspecified abdominal pain: Secondary | ICD-10-CM | POA: Diagnosis not present

## 2020-07-19 DIAGNOSIS — H18522 Epithelial (juvenile) corneal dystrophy, left eye: Secondary | ICD-10-CM | POA: Diagnosis not present

## 2020-07-19 DIAGNOSIS — H0288A Meibomian gland dysfunction right eye, upper and lower eyelids: Secondary | ICD-10-CM | POA: Diagnosis not present

## 2020-07-19 DIAGNOSIS — H0288B Meibomian gland dysfunction left eye, upper and lower eyelids: Secondary | ICD-10-CM | POA: Diagnosis not present

## 2020-07-19 DIAGNOSIS — H43811 Vitreous degeneration, right eye: Secondary | ICD-10-CM | POA: Diagnosis not present

## 2020-07-19 DIAGNOSIS — L718 Other rosacea: Secondary | ICD-10-CM | POA: Diagnosis not present

## 2020-07-22 DIAGNOSIS — M170 Bilateral primary osteoarthritis of knee: Secondary | ICD-10-CM | POA: Diagnosis not present

## 2020-07-22 DIAGNOSIS — K219 Gastro-esophageal reflux disease without esophagitis: Secondary | ICD-10-CM | POA: Diagnosis not present

## 2020-07-22 DIAGNOSIS — R69 Illness, unspecified: Secondary | ICD-10-CM | POA: Diagnosis not present

## 2020-07-22 DIAGNOSIS — E7800 Pure hypercholesterolemia, unspecified: Secondary | ICD-10-CM | POA: Diagnosis not present

## 2020-07-22 DIAGNOSIS — E039 Hypothyroidism, unspecified: Secondary | ICD-10-CM | POA: Diagnosis not present

## 2020-08-17 DIAGNOSIS — E039 Hypothyroidism, unspecified: Secondary | ICD-10-CM | POA: Diagnosis not present

## 2020-08-17 DIAGNOSIS — M170 Bilateral primary osteoarthritis of knee: Secondary | ICD-10-CM | POA: Diagnosis not present

## 2020-08-17 DIAGNOSIS — K219 Gastro-esophageal reflux disease without esophagitis: Secondary | ICD-10-CM | POA: Diagnosis not present

## 2020-08-17 DIAGNOSIS — L4050 Arthropathic psoriasis, unspecified: Secondary | ICD-10-CM | POA: Diagnosis not present

## 2020-08-17 DIAGNOSIS — R69 Illness, unspecified: Secondary | ICD-10-CM | POA: Diagnosis not present

## 2020-08-17 DIAGNOSIS — E785 Hyperlipidemia, unspecified: Secondary | ICD-10-CM | POA: Diagnosis not present

## 2020-08-17 DIAGNOSIS — E7800 Pure hypercholesterolemia, unspecified: Secondary | ICD-10-CM | POA: Diagnosis not present

## 2020-08-25 ENCOUNTER — Other Ambulatory Visit: Payer: Self-pay

## 2020-08-25 ENCOUNTER — Other Ambulatory Visit (HOSPITAL_BASED_OUTPATIENT_CLINIC_OR_DEPARTMENT_OTHER): Payer: Self-pay

## 2020-08-25 ENCOUNTER — Ambulatory Visit: Payer: Medicare HMO | Attending: Internal Medicine

## 2020-08-25 DIAGNOSIS — Z23 Encounter for immunization: Secondary | ICD-10-CM

## 2020-08-25 MED ORDER — PFIZER-BIONT COVID-19 VAC-TRIS 30 MCG/0.3ML IM SUSP
INTRAMUSCULAR | 0 refills | Status: DC
  Filled 2020-08-25: qty 0.3, 1d supply, fill #0

## 2020-08-25 NOTE — Progress Notes (Signed)
   Covid-19 Vaccination Clinic  Name:  Ashley Griffith    MRN: [id number] DOB: [birth year]  08/25/2020  [name] was observed post Covid-19 immunization for 15 minutes without incident. She was provided with Vaccine Information Sheet and instruction to access the V-Safe system.   [name] was instructed to call 911 with any severe reactions post vaccine: Marland Kitchen Difficulty breathing  . Swelling of face and throat  . A fast heartbeat  . A bad rash all over body  . Dizziness and weakness   Immunizations Administered    Name Date Dose VIS Date Route   PFIZER Comrnaty(Gray TOP) Covid-19 Vaccine 08/25/2020 12:29 PM 0.3 mL 04/01/2020 Intramuscular   Manufacturer: [place]   Lot: [id number]   [facility]: [phone]

## 2020-08-26 ENCOUNTER — Ambulatory Visit: Payer: Medicare HMO

## 2020-09-06 DIAGNOSIS — M65832 Other synovitis and tenosynovitis, left forearm: Secondary | ICD-10-CM | POA: Diagnosis not present

## 2020-09-06 DIAGNOSIS — M65839 Other synovitis and tenosynovitis, unspecified forearm: Secondary | ICD-10-CM | POA: Diagnosis not present

## 2020-09-06 DIAGNOSIS — M25532 Pain in left wrist: Secondary | ICD-10-CM | POA: Diagnosis not present

## 2020-09-10 ENCOUNTER — Other Ambulatory Visit: Payer: Self-pay | Admitting: Physician Assistant

## 2020-09-13 ENCOUNTER — Other Ambulatory Visit: Payer: Self-pay | Admitting: Physician Assistant

## 2020-09-13 DIAGNOSIS — H01003 Unspecified blepharitis right eye, unspecified eyelid: Secondary | ICD-10-CM | POA: Diagnosis not present

## 2020-09-13 DIAGNOSIS — H01006 Unspecified blepharitis left eye, unspecified eyelid: Secondary | ICD-10-CM | POA: Diagnosis not present

## 2020-09-13 DIAGNOSIS — M25532 Pain in left wrist: Secondary | ICD-10-CM

## 2020-09-17 ENCOUNTER — Other Ambulatory Visit: Payer: Self-pay

## 2020-09-17 ENCOUNTER — Ambulatory Visit
Admission: RE | Admit: 2020-09-17 | Discharge: 2020-09-17 | Disposition: A | Discharge status: Home / Self Care | Payer: Medicare HMO | Source: Ambulatory Visit | Attending: Physician Assistant | Admitting: Physician Assistant

## 2020-09-17 DIAGNOSIS — M1812 Unilateral primary osteoarthritis of first carpometacarpal joint, left hand: Secondary | ICD-10-CM | POA: Diagnosis not present

## 2020-09-17 DIAGNOSIS — M7989 Other specified soft tissue disorders: Secondary | ICD-10-CM | POA: Diagnosis not present

## 2020-09-17 DIAGNOSIS — M65842 Other synovitis and tenosynovitis, left hand: Secondary | ICD-10-CM | POA: Diagnosis not present

## 2020-09-17 DIAGNOSIS — R200 Anesthesia of skin: Secondary | ICD-10-CM | POA: Diagnosis not present

## 2020-09-17 DIAGNOSIS — M25532 Pain in left wrist: Secondary | ICD-10-CM

## 2020-09-23 DIAGNOSIS — M65832 Other synovitis and tenosynovitis, left forearm: Secondary | ICD-10-CM | POA: Diagnosis not present

## 2020-09-28 DIAGNOSIS — L4050 Arthropathic psoriasis, unspecified: Secondary | ICD-10-CM | POA: Diagnosis not present

## 2020-09-28 DIAGNOSIS — M25511 Pain in right shoulder: Secondary | ICD-10-CM | POA: Diagnosis not present

## 2020-09-28 DIAGNOSIS — M2550 Pain in unspecified joint: Secondary | ICD-10-CM | POA: Diagnosis not present

## 2020-09-28 DIAGNOSIS — M79641 Pain in right hand: Secondary | ICD-10-CM | POA: Diagnosis not present

## 2020-09-28 DIAGNOSIS — E559 Vitamin D deficiency, unspecified: Secondary | ICD-10-CM | POA: Diagnosis not present

## 2020-09-28 DIAGNOSIS — M1710 Unilateral primary osteoarthritis, unspecified knee: Secondary | ICD-10-CM | POA: Diagnosis not present

## 2020-09-28 DIAGNOSIS — L408 Other psoriasis: Secondary | ICD-10-CM | POA: Diagnosis not present

## 2020-09-28 DIAGNOSIS — M25531 Pain in right wrist: Secondary | ICD-10-CM | POA: Diagnosis not present

## 2020-09-28 DIAGNOSIS — M542 Cervicalgia: Secondary | ICD-10-CM | POA: Diagnosis not present

## 2020-09-28 DIAGNOSIS — Z79899 Other long term (current) drug therapy: Secondary | ICD-10-CM | POA: Diagnosis not present

## 2020-09-29 DIAGNOSIS — Z419 Encounter for procedure for purposes other than remedying health state, unspecified: Secondary | ICD-10-CM | POA: Diagnosis not present

## 2020-09-29 DIAGNOSIS — Z6825 Body mass index (BMI) 25.0-25.9, adult: Secondary | ICD-10-CM | POA: Diagnosis not present

## 2020-10-04 ENCOUNTER — Other Ambulatory Visit: Payer: Self-pay

## 2020-10-04 DIAGNOSIS — M65832 Other synovitis and tenosynovitis, left forearm: Secondary | ICD-10-CM | POA: Diagnosis not present

## 2020-10-04 DIAGNOSIS — M6588 Other synovitis and tenosynovitis, other site: Secondary | ICD-10-CM | POA: Diagnosis not present

## 2020-10-04 DIAGNOSIS — G5602 Carpal tunnel syndrome, left upper limb: Secondary | ICD-10-CM | POA: Diagnosis not present

## 2020-10-04 DIAGNOSIS — G8918 Other acute postprocedural pain: Secondary | ICD-10-CM | POA: Diagnosis not present

## 2020-10-28 DIAGNOSIS — H524 Presbyopia: Secondary | ICD-10-CM | POA: Diagnosis not present

## 2020-10-28 DIAGNOSIS — H04129 Dry eye syndrome of unspecified lacrimal gland: Secondary | ICD-10-CM | POA: Diagnosis not present

## 2020-10-28 DIAGNOSIS — Z961 Presence of intraocular lens: Secondary | ICD-10-CM | POA: Diagnosis not present

## 2020-10-29 DIAGNOSIS — E785 Hyperlipidemia, unspecified: Secondary | ICD-10-CM | POA: Diagnosis not present

## 2020-10-29 DIAGNOSIS — L4050 Arthropathic psoriasis, unspecified: Secondary | ICD-10-CM | POA: Diagnosis not present

## 2020-10-29 DIAGNOSIS — R69 Illness, unspecified: Secondary | ICD-10-CM | POA: Diagnosis not present

## 2020-10-29 DIAGNOSIS — K219 Gastro-esophageal reflux disease without esophagitis: Secondary | ICD-10-CM | POA: Diagnosis not present

## 2020-10-29 DIAGNOSIS — E7800 Pure hypercholesterolemia, unspecified: Secondary | ICD-10-CM | POA: Diagnosis not present

## 2020-10-29 DIAGNOSIS — E039 Hypothyroidism, unspecified: Secondary | ICD-10-CM | POA: Diagnosis not present

## 2020-10-29 DIAGNOSIS — M170 Bilateral primary osteoarthritis of knee: Secondary | ICD-10-CM | POA: Diagnosis not present

## 2020-11-12 DIAGNOSIS — M25531 Pain in right wrist: Secondary | ICD-10-CM | POA: Diagnosis not present

## 2020-11-12 DIAGNOSIS — M25532 Pain in left wrist: Secondary | ICD-10-CM | POA: Diagnosis not present

## 2020-12-07 DIAGNOSIS — Z1231 Encounter for screening mammogram for malignant neoplasm of breast: Secondary | ICD-10-CM | POA: Diagnosis not present

## 2020-12-09 DIAGNOSIS — M542 Cervicalgia: Secondary | ICD-10-CM | POA: Diagnosis not present

## 2020-12-09 DIAGNOSIS — M1710 Unilateral primary osteoarthritis, unspecified knee: Secondary | ICD-10-CM | POA: Diagnosis not present

## 2020-12-09 DIAGNOSIS — M25511 Pain in right shoulder: Secondary | ICD-10-CM | POA: Diagnosis not present

## 2020-12-09 DIAGNOSIS — M25531 Pain in right wrist: Secondary | ICD-10-CM | POA: Diagnosis not present

## 2020-12-09 DIAGNOSIS — L408 Other psoriasis: Secondary | ICD-10-CM | POA: Diagnosis not present

## 2020-12-09 DIAGNOSIS — L4050 Arthropathic psoriasis, unspecified: Secondary | ICD-10-CM | POA: Diagnosis not present

## 2020-12-09 DIAGNOSIS — E559 Vitamin D deficiency, unspecified: Secondary | ICD-10-CM | POA: Diagnosis not present

## 2020-12-09 DIAGNOSIS — Z79899 Other long term (current) drug therapy: Secondary | ICD-10-CM | POA: Diagnosis not present

## 2020-12-09 DIAGNOSIS — M79641 Pain in right hand: Secondary | ICD-10-CM | POA: Diagnosis not present

## 2020-12-09 DIAGNOSIS — M2550 Pain in unspecified joint: Secondary | ICD-10-CM | POA: Diagnosis not present

## 2021-01-03 DIAGNOSIS — M25511 Pain in right shoulder: Secondary | ICD-10-CM | POA: Diagnosis not present

## 2021-01-03 DIAGNOSIS — L4050 Arthropathic psoriasis, unspecified: Secondary | ICD-10-CM | POA: Diagnosis not present

## 2021-01-03 DIAGNOSIS — M1710 Unilateral primary osteoarthritis, unspecified knee: Secondary | ICD-10-CM | POA: Diagnosis not present

## 2021-01-03 DIAGNOSIS — E559 Vitamin D deficiency, unspecified: Secondary | ICD-10-CM | POA: Diagnosis not present

## 2021-01-03 DIAGNOSIS — M79641 Pain in right hand: Secondary | ICD-10-CM | POA: Diagnosis not present

## 2021-01-03 DIAGNOSIS — M25531 Pain in right wrist: Secondary | ICD-10-CM | POA: Diagnosis not present

## 2021-01-03 DIAGNOSIS — Z79899 Other long term (current) drug therapy: Secondary | ICD-10-CM | POA: Diagnosis not present

## 2021-01-03 DIAGNOSIS — L408 Other psoriasis: Secondary | ICD-10-CM | POA: Diagnosis not present

## 2021-01-03 DIAGNOSIS — Z6824 Body mass index (BMI) 24.0-24.9, adult: Secondary | ICD-10-CM | POA: Diagnosis not present

## 2021-01-03 DIAGNOSIS — M542 Cervicalgia: Secondary | ICD-10-CM | POA: Diagnosis not present

## 2021-01-05 DIAGNOSIS — R69 Illness, unspecified: Secondary | ICD-10-CM | POA: Diagnosis not present

## 2021-01-05 DIAGNOSIS — G629 Polyneuropathy, unspecified: Secondary | ICD-10-CM | POA: Diagnosis not present

## 2021-01-05 DIAGNOSIS — L4050 Arthropathic psoriasis, unspecified: Secondary | ICD-10-CM | POA: Diagnosis not present

## 2021-01-05 DIAGNOSIS — G479 Sleep disorder, unspecified: Secondary | ICD-10-CM | POA: Diagnosis not present

## 2021-01-05 DIAGNOSIS — E039 Hypothyroidism, unspecified: Secondary | ICD-10-CM | POA: Diagnosis not present

## 2021-01-05 DIAGNOSIS — K219 Gastro-esophageal reflux disease without esophagitis: Secondary | ICD-10-CM | POA: Diagnosis not present

## 2021-01-05 DIAGNOSIS — F411 Generalized anxiety disorder: Secondary | ICD-10-CM | POA: Diagnosis not present

## 2021-01-05 DIAGNOSIS — Z0000 Encounter for general adult medical examination without abnormal findings: Secondary | ICD-10-CM | POA: Diagnosis not present

## 2021-01-05 DIAGNOSIS — E7800 Pure hypercholesterolemia, unspecified: Secondary | ICD-10-CM | POA: Diagnosis not present

## 2021-01-05 DIAGNOSIS — Z86711 Personal history of pulmonary embolism: Secondary | ICD-10-CM | POA: Diagnosis not present

## 2021-01-21 DIAGNOSIS — E7800 Pure hypercholesterolemia, unspecified: Secondary | ICD-10-CM | POA: Diagnosis not present

## 2021-01-21 DIAGNOSIS — R69 Illness, unspecified: Secondary | ICD-10-CM | POA: Diagnosis not present

## 2021-01-21 DIAGNOSIS — E785 Hyperlipidemia, unspecified: Secondary | ICD-10-CM | POA: Diagnosis not present

## 2021-01-21 DIAGNOSIS — E039 Hypothyroidism, unspecified: Secondary | ICD-10-CM | POA: Diagnosis not present

## 2021-01-21 DIAGNOSIS — M170 Bilateral primary osteoarthritis of knee: Secondary | ICD-10-CM | POA: Diagnosis not present

## 2021-01-21 DIAGNOSIS — L4050 Arthropathic psoriasis, unspecified: Secondary | ICD-10-CM | POA: Diagnosis not present

## 2021-01-21 DIAGNOSIS — K219 Gastro-esophageal reflux disease without esophagitis: Secondary | ICD-10-CM | POA: Diagnosis not present

## 2021-02-18 ENCOUNTER — Ambulatory Visit: Payer: Medicare HMO | Attending: Internal Medicine

## 2021-02-18 ENCOUNTER — Other Ambulatory Visit: Payer: Self-pay

## 2021-02-18 ENCOUNTER — Other Ambulatory Visit (HOSPITAL_BASED_OUTPATIENT_CLINIC_OR_DEPARTMENT_OTHER): Payer: Self-pay

## 2021-02-18 DIAGNOSIS — Z23 Encounter for immunization: Secondary | ICD-10-CM

## 2021-02-18 MED ORDER — PFIZER COVID-19 VAC BIVALENT 30 MCG/0.3ML IM SUSP
INTRAMUSCULAR | 0 refills | Status: DC
Start: 2021-02-18 — End: 2022-02-10
  Filled 2021-02-18: qty 0.3, 1d supply, fill #0

## 2021-02-18 MED ORDER — FLUAD QUADRIVALENT 0.5 ML IM PRSY
PREFILLED_SYRINGE | INTRAMUSCULAR | 0 refills | Status: DC
  Filled 2021-02-18: qty 0.5, 1d supply, fill #0

## 2021-02-18 NOTE — Progress Notes (Signed)
   Covid-19 Vaccination Clinic  Name:  Ashley Griffith    MRN: [id number] DOB: 03-12-1946  02/18/2021  Ms. Coddington was observed post Covid-19 immunization for 15 minutes without incident. She was provided with Vaccine Information Sheet and instruction to access the V-Safe system.   Ms. Novakovich was instructed to call 911 with any severe reactions post vaccine: Difficulty breathing  Swelling of face and throat  A fast heartbeat  A bad rash all over body  Dizziness and weakness   Immunizations Administered     Name Date Dose VIS Date Route   Pfizer Covid-19 Vaccine Bivalent Booster 02/18/2021 12:20 PM 0.3 mL 12/22/2020 Intramuscular   Manufacturer: [place]   Lot: [id number]   [place]: [phone]

## 2021-03-21 DIAGNOSIS — M1710 Unilateral primary osteoarthritis, unspecified knee: Secondary | ICD-10-CM | POA: Diagnosis not present

## 2021-03-21 DIAGNOSIS — Z79899 Other long term (current) drug therapy: Secondary | ICD-10-CM | POA: Diagnosis not present

## 2021-03-21 DIAGNOSIS — L4050 Arthropathic psoriasis, unspecified: Secondary | ICD-10-CM | POA: Diagnosis not present

## 2021-03-21 DIAGNOSIS — E559 Vitamin D deficiency, unspecified: Secondary | ICD-10-CM | POA: Diagnosis not present

## 2021-03-21 DIAGNOSIS — M542 Cervicalgia: Secondary | ICD-10-CM | POA: Diagnosis not present

## 2021-03-21 DIAGNOSIS — M25511 Pain in right shoulder: Secondary | ICD-10-CM | POA: Diagnosis not present

## 2021-03-21 DIAGNOSIS — Z6824 Body mass index (BMI) 24.0-24.9, adult: Secondary | ICD-10-CM | POA: Diagnosis not present

## 2021-03-21 DIAGNOSIS — M25531 Pain in right wrist: Secondary | ICD-10-CM | POA: Diagnosis not present

## 2021-03-21 DIAGNOSIS — M79641 Pain in right hand: Secondary | ICD-10-CM | POA: Diagnosis not present

## 2021-03-21 DIAGNOSIS — L408 Other psoriasis: Secondary | ICD-10-CM | POA: Diagnosis not present

## 2021-05-02 DIAGNOSIS — M25531 Pain in right wrist: Secondary | ICD-10-CM | POA: Diagnosis not present

## 2021-05-02 DIAGNOSIS — L408 Other psoriasis: Secondary | ICD-10-CM | POA: Diagnosis not present

## 2021-05-02 DIAGNOSIS — M1710 Unilateral primary osteoarthritis, unspecified knee: Secondary | ICD-10-CM | POA: Diagnosis not present

## 2021-05-02 DIAGNOSIS — Z79899 Other long term (current) drug therapy: Secondary | ICD-10-CM | POA: Diagnosis not present

## 2021-05-02 DIAGNOSIS — M542 Cervicalgia: Secondary | ICD-10-CM | POA: Diagnosis not present

## 2021-05-02 DIAGNOSIS — M25511 Pain in right shoulder: Secondary | ICD-10-CM | POA: Diagnosis not present

## 2021-05-02 DIAGNOSIS — Z6824 Body mass index (BMI) 24.0-24.9, adult: Secondary | ICD-10-CM | POA: Diagnosis not present

## 2021-05-02 DIAGNOSIS — L4050 Arthropathic psoriasis, unspecified: Secondary | ICD-10-CM | POA: Diagnosis not present

## 2021-05-02 DIAGNOSIS — E559 Vitamin D deficiency, unspecified: Secondary | ICD-10-CM | POA: Diagnosis not present

## 2021-05-02 DIAGNOSIS — M79641 Pain in right hand: Secondary | ICD-10-CM | POA: Diagnosis not present

## 2021-05-19 DIAGNOSIS — M170 Bilateral primary osteoarthritis of knee: Secondary | ICD-10-CM | POA: Diagnosis not present

## 2021-05-19 DIAGNOSIS — L4050 Arthropathic psoriasis, unspecified: Secondary | ICD-10-CM | POA: Diagnosis not present

## 2021-05-19 DIAGNOSIS — E039 Hypothyroidism, unspecified: Secondary | ICD-10-CM | POA: Diagnosis not present

## 2021-05-19 DIAGNOSIS — K219 Gastro-esophageal reflux disease without esophagitis: Secondary | ICD-10-CM | POA: Diagnosis not present

## 2021-05-19 DIAGNOSIS — R69 Illness, unspecified: Secondary | ICD-10-CM | POA: Diagnosis not present

## 2021-05-19 DIAGNOSIS — E785 Hyperlipidemia, unspecified: Secondary | ICD-10-CM | POA: Diagnosis not present

## 2021-06-16 DIAGNOSIS — M25531 Pain in right wrist: Secondary | ICD-10-CM | POA: Diagnosis not present

## 2021-06-16 DIAGNOSIS — Z6823 Body mass index (BMI) 23.0-23.9, adult: Secondary | ICD-10-CM | POA: Diagnosis not present

## 2021-06-16 DIAGNOSIS — M542 Cervicalgia: Secondary | ICD-10-CM | POA: Diagnosis not present

## 2021-06-16 DIAGNOSIS — L408 Other psoriasis: Secondary | ICD-10-CM | POA: Diagnosis not present

## 2021-06-16 DIAGNOSIS — L4050 Arthropathic psoriasis, unspecified: Secondary | ICD-10-CM | POA: Diagnosis not present

## 2021-06-16 DIAGNOSIS — E559 Vitamin D deficiency, unspecified: Secondary | ICD-10-CM | POA: Diagnosis not present

## 2021-06-16 DIAGNOSIS — M25511 Pain in right shoulder: Secondary | ICD-10-CM | POA: Diagnosis not present

## 2021-06-16 DIAGNOSIS — Z79899 Other long term (current) drug therapy: Secondary | ICD-10-CM | POA: Diagnosis not present

## 2021-06-16 DIAGNOSIS — M1710 Unilateral primary osteoarthritis, unspecified knee: Secondary | ICD-10-CM | POA: Diagnosis not present

## 2021-06-16 DIAGNOSIS — M79641 Pain in right hand: Secondary | ICD-10-CM | POA: Diagnosis not present

## 2021-06-22 DIAGNOSIS — L718 Other rosacea: Secondary | ICD-10-CM | POA: Diagnosis not present

## 2021-06-22 DIAGNOSIS — H43811 Vitreous degeneration, right eye: Secondary | ICD-10-CM | POA: Diagnosis not present

## 2021-06-22 DIAGNOSIS — Z961 Presence of intraocular lens: Secondary | ICD-10-CM | POA: Diagnosis not present

## 2021-06-22 DIAGNOSIS — H0288A Meibomian gland dysfunction right eye, upper and lower eyelids: Secondary | ICD-10-CM | POA: Diagnosis not present

## 2021-06-22 DIAGNOSIS — Z9889 Other specified postprocedural states: Secondary | ICD-10-CM | POA: Diagnosis not present

## 2021-06-22 DIAGNOSIS — H18522 Epithelial (juvenile) corneal dystrophy, left eye: Secondary | ICD-10-CM | POA: Diagnosis not present

## 2021-06-22 DIAGNOSIS — H0288B Meibomian gland dysfunction left eye, upper and lower eyelids: Secondary | ICD-10-CM | POA: Diagnosis not present

## 2021-07-13 DIAGNOSIS — Z7901 Long term (current) use of anticoagulants: Secondary | ICD-10-CM | POA: Diagnosis not present

## 2021-07-13 DIAGNOSIS — Z86711 Personal history of pulmonary embolism: Secondary | ICD-10-CM | POA: Diagnosis not present

## 2021-07-13 DIAGNOSIS — E039 Hypothyroidism, unspecified: Secondary | ICD-10-CM | POA: Diagnosis not present

## 2021-07-13 DIAGNOSIS — G629 Polyneuropathy, unspecified: Secondary | ICD-10-CM | POA: Diagnosis not present

## 2021-07-13 DIAGNOSIS — E7800 Pure hypercholesterolemia, unspecified: Secondary | ICD-10-CM | POA: Diagnosis not present

## 2021-07-13 DIAGNOSIS — F321 Major depressive disorder, single episode, moderate: Secondary | ICD-10-CM | POA: Diagnosis not present

## 2021-07-13 DIAGNOSIS — L4050 Arthropathic psoriasis, unspecified: Secondary | ICD-10-CM | POA: Diagnosis not present

## 2021-07-13 DIAGNOSIS — R69 Illness, unspecified: Secondary | ICD-10-CM | POA: Diagnosis not present

## 2021-08-22 DIAGNOSIS — B379 Candidiasis, unspecified: Secondary | ICD-10-CM | POA: Diagnosis not present

## 2021-09-01 DIAGNOSIS — B372 Candidiasis of skin and nail: Secondary | ICD-10-CM | POA: Diagnosis not present

## 2021-09-14 DIAGNOSIS — Z6823 Body mass index (BMI) 23.0-23.9, adult: Secondary | ICD-10-CM | POA: Diagnosis not present

## 2021-09-14 DIAGNOSIS — M25531 Pain in right wrist: Secondary | ICD-10-CM | POA: Diagnosis not present

## 2021-09-14 DIAGNOSIS — L4050 Arthropathic psoriasis, unspecified: Secondary | ICD-10-CM | POA: Diagnosis not present

## 2021-09-14 DIAGNOSIS — M25511 Pain in right shoulder: Secondary | ICD-10-CM | POA: Diagnosis not present

## 2021-09-14 DIAGNOSIS — E559 Vitamin D deficiency, unspecified: Secondary | ICD-10-CM | POA: Diagnosis not present

## 2021-09-14 DIAGNOSIS — Z79899 Other long term (current) drug therapy: Secondary | ICD-10-CM | POA: Diagnosis not present

## 2021-09-14 DIAGNOSIS — M542 Cervicalgia: Secondary | ICD-10-CM | POA: Diagnosis not present

## 2021-09-14 DIAGNOSIS — M79641 Pain in right hand: Secondary | ICD-10-CM | POA: Diagnosis not present

## 2021-09-14 DIAGNOSIS — L408 Other psoriasis: Secondary | ICD-10-CM | POA: Diagnosis not present

## 2021-09-14 DIAGNOSIS — M1710 Unilateral primary osteoarthritis, unspecified knee: Secondary | ICD-10-CM | POA: Diagnosis not present

## 2021-09-23 DIAGNOSIS — M170 Bilateral primary osteoarthritis of knee: Secondary | ICD-10-CM | POA: Diagnosis not present

## 2021-10-11 DIAGNOSIS — L720 Epidermal cyst: Secondary | ICD-10-CM | POA: Diagnosis not present

## 2021-10-11 DIAGNOSIS — L723 Sebaceous cyst: Secondary | ICD-10-CM | POA: Diagnosis not present

## 2021-10-11 DIAGNOSIS — L308 Other specified dermatitis: Secondary | ICD-10-CM | POA: Diagnosis not present

## 2021-10-21 DIAGNOSIS — M170 Bilateral primary osteoarthritis of knee: Secondary | ICD-10-CM | POA: Diagnosis not present

## 2021-10-21 DIAGNOSIS — M1711 Unilateral primary osteoarthritis, right knee: Secondary | ICD-10-CM | POA: Diagnosis not present

## 2021-10-24 DIAGNOSIS — R69 Illness, unspecified: Secondary | ICD-10-CM | POA: Diagnosis not present

## 2021-10-24 DIAGNOSIS — R110 Nausea: Secondary | ICD-10-CM | POA: Diagnosis not present

## 2021-10-24 DIAGNOSIS — R634 Abnormal weight loss: Secondary | ICD-10-CM | POA: Diagnosis not present

## 2021-10-24 DIAGNOSIS — B3731 Acute candidiasis of vulva and vagina: Secondary | ICD-10-CM | POA: Diagnosis not present

## 2021-10-28 DIAGNOSIS — M170 Bilateral primary osteoarthritis of knee: Secondary | ICD-10-CM | POA: Diagnosis not present

## 2021-10-28 DIAGNOSIS — M1712 Unilateral primary osteoarthritis, left knee: Secondary | ICD-10-CM | POA: Diagnosis not present

## 2021-10-28 DIAGNOSIS — R634 Abnormal weight loss: Secondary | ICD-10-CM | POA: Diagnosis not present

## 2021-11-01 DIAGNOSIS — Z1211 Encounter for screening for malignant neoplasm of colon: Secondary | ICD-10-CM | POA: Diagnosis not present

## 2021-11-01 DIAGNOSIS — Z1212 Encounter for screening for malignant neoplasm of rectum: Secondary | ICD-10-CM | POA: Diagnosis not present

## 2021-11-03 DIAGNOSIS — Z961 Presence of intraocular lens: Secondary | ICD-10-CM | POA: Diagnosis not present

## 2021-11-03 DIAGNOSIS — H18522 Epithelial (juvenile) corneal dystrophy, left eye: Secondary | ICD-10-CM | POA: Diagnosis not present

## 2021-11-03 DIAGNOSIS — H5202 Hypermetropia, left eye: Secondary | ICD-10-CM | POA: Diagnosis not present

## 2021-11-03 DIAGNOSIS — H0100B Unspecified blepharitis left eye, upper and lower eyelids: Secondary | ICD-10-CM | POA: Diagnosis not present

## 2021-11-03 DIAGNOSIS — H52203 Unspecified astigmatism, bilateral: Secondary | ICD-10-CM | POA: Diagnosis not present

## 2021-11-03 DIAGNOSIS — H0100A Unspecified blepharitis right eye, upper and lower eyelids: Secondary | ICD-10-CM | POA: Diagnosis not present

## 2021-11-03 DIAGNOSIS — H5211 Myopia, right eye: Secondary | ICD-10-CM | POA: Diagnosis not present

## 2021-11-03 DIAGNOSIS — H524 Presbyopia: Secondary | ICD-10-CM | POA: Diagnosis not present

## 2021-11-04 DIAGNOSIS — M170 Bilateral primary osteoarthritis of knee: Secondary | ICD-10-CM | POA: Diagnosis not present

## 2021-11-08 LAB — COLOGUARD: COLOGUARD: NEGATIVE

## 2021-11-11 DIAGNOSIS — R21 Rash and other nonspecific skin eruption: Secondary | ICD-10-CM | POA: Diagnosis not present

## 2021-11-17 DIAGNOSIS — D721 Eosinophilia, unspecified: Secondary | ICD-10-CM | POA: Diagnosis not present

## 2021-11-28 DIAGNOSIS — H547 Unspecified visual loss: Secondary | ICD-10-CM | POA: Diagnosis not present

## 2021-11-28 DIAGNOSIS — I87309 Chronic venous hypertension (idiopathic) without complications of unspecified lower extremity: Secondary | ICD-10-CM | POA: Diagnosis not present

## 2021-11-28 DIAGNOSIS — L4050 Arthropathic psoriasis, unspecified: Secondary | ICD-10-CM | POA: Diagnosis not present

## 2021-11-28 DIAGNOSIS — K219 Gastro-esophageal reflux disease without esophagitis: Secondary | ICD-10-CM | POA: Diagnosis not present

## 2021-11-28 DIAGNOSIS — R69 Illness, unspecified: Secondary | ICD-10-CM | POA: Diagnosis not present

## 2021-11-28 DIAGNOSIS — I2782 Chronic pulmonary embolism: Secondary | ICD-10-CM | POA: Diagnosis not present

## 2021-11-28 DIAGNOSIS — G629 Polyneuropathy, unspecified: Secondary | ICD-10-CM | POA: Diagnosis not present

## 2021-11-28 DIAGNOSIS — E785 Hyperlipidemia, unspecified: Secondary | ICD-10-CM | POA: Diagnosis not present

## 2021-11-28 DIAGNOSIS — I951 Orthostatic hypotension: Secondary | ICD-10-CM | POA: Diagnosis not present

## 2021-11-28 DIAGNOSIS — E890 Postprocedural hypothyroidism: Secondary | ICD-10-CM | POA: Diagnosis not present

## 2021-12-13 DIAGNOSIS — Z1231 Encounter for screening mammogram for malignant neoplasm of breast: Secondary | ICD-10-CM | POA: Diagnosis not present

## 2021-12-28 DIAGNOSIS — E7800 Pure hypercholesterolemia, unspecified: Secondary | ICD-10-CM | POA: Diagnosis not present

## 2021-12-28 DIAGNOSIS — F321 Major depressive disorder, single episode, moderate: Secondary | ICD-10-CM | POA: Diagnosis not present

## 2021-12-28 DIAGNOSIS — R69 Illness, unspecified: Secondary | ICD-10-CM | POA: Diagnosis not present

## 2021-12-28 DIAGNOSIS — E039 Hypothyroidism, unspecified: Secondary | ICD-10-CM | POA: Diagnosis not present

## 2021-12-28 DIAGNOSIS — L4050 Arthropathic psoriasis, unspecified: Secondary | ICD-10-CM | POA: Diagnosis not present

## 2021-12-28 DIAGNOSIS — G629 Polyneuropathy, unspecified: Secondary | ICD-10-CM | POA: Diagnosis not present

## 2022-01-10 DIAGNOSIS — R69 Illness, unspecified: Secondary | ICD-10-CM | POA: Diagnosis not present

## 2022-01-10 DIAGNOSIS — M546 Pain in thoracic spine: Secondary | ICD-10-CM | POA: Diagnosis not present

## 2022-01-10 DIAGNOSIS — I951 Orthostatic hypotension: Secondary | ICD-10-CM | POA: Diagnosis not present

## 2022-01-10 DIAGNOSIS — E039 Hypothyroidism, unspecified: Secondary | ICD-10-CM | POA: Diagnosis not present

## 2022-01-10 DIAGNOSIS — Z23 Encounter for immunization: Secondary | ICD-10-CM | POA: Diagnosis not present

## 2022-01-10 DIAGNOSIS — G8929 Other chronic pain: Secondary | ICD-10-CM | POA: Diagnosis not present

## 2022-01-10 DIAGNOSIS — Z7901 Long term (current) use of anticoagulants: Secondary | ICD-10-CM | POA: Diagnosis not present

## 2022-01-10 DIAGNOSIS — E7800 Pure hypercholesterolemia, unspecified: Secondary | ICD-10-CM | POA: Diagnosis not present

## 2022-01-10 DIAGNOSIS — Z86711 Personal history of pulmonary embolism: Secondary | ICD-10-CM | POA: Diagnosis not present

## 2022-01-10 DIAGNOSIS — D6869 Other thrombophilia: Secondary | ICD-10-CM | POA: Diagnosis not present

## 2022-01-10 DIAGNOSIS — L4050 Arthropathic psoriasis, unspecified: Secondary | ICD-10-CM | POA: Diagnosis not present

## 2022-01-13 ENCOUNTER — Other Ambulatory Visit: Payer: Self-pay | Admitting: Family Medicine

## 2022-01-13 ENCOUNTER — Ambulatory Visit
Admission: RE | Admit: 2022-01-13 | Discharge: 2022-01-13 | Disposition: A | Discharge status: Home / Self Care | Payer: Medicare HMO | Source: Ambulatory Visit | Attending: Family Medicine | Admitting: Family Medicine

## 2022-01-13 ENCOUNTER — Other Ambulatory Visit: Payer: Self-pay | Admitting: Cardiology

## 2022-01-13 DIAGNOSIS — M546 Pain in thoracic spine: Secondary | ICD-10-CM

## 2022-01-20 DIAGNOSIS — M85852 Other specified disorders of bone density and structure, left thigh: Secondary | ICD-10-CM | POA: Diagnosis not present

## 2022-01-20 DIAGNOSIS — M810 Age-related osteoporosis without current pathological fracture: Secondary | ICD-10-CM | POA: Diagnosis not present

## 2022-01-20 DIAGNOSIS — M85851 Other specified disorders of bone density and structure, right thigh: Secondary | ICD-10-CM | POA: Diagnosis not present

## 2022-01-20 DIAGNOSIS — Z780 Asymptomatic menopausal state: Secondary | ICD-10-CM | POA: Diagnosis not present

## 2022-01-24 DIAGNOSIS — Z1389 Encounter for screening for other disorder: Secondary | ICD-10-CM | POA: Diagnosis not present

## 2022-01-24 DIAGNOSIS — Z0000 Encounter for general adult medical examination without abnormal findings: Secondary | ICD-10-CM | POA: Diagnosis not present

## 2022-01-26 DIAGNOSIS — Z6822 Body mass index (BMI) 22.0-22.9, adult: Secondary | ICD-10-CM | POA: Diagnosis not present

## 2022-01-26 DIAGNOSIS — M25531 Pain in right wrist: Secondary | ICD-10-CM | POA: Diagnosis not present

## 2022-01-26 DIAGNOSIS — M542 Cervicalgia: Secondary | ICD-10-CM | POA: Diagnosis not present

## 2022-01-26 DIAGNOSIS — L408 Other psoriasis: Secondary | ICD-10-CM | POA: Diagnosis not present

## 2022-01-26 DIAGNOSIS — M1710 Unilateral primary osteoarthritis, unspecified knee: Secondary | ICD-10-CM | POA: Diagnosis not present

## 2022-01-26 DIAGNOSIS — Z79899 Other long term (current) drug therapy: Secondary | ICD-10-CM | POA: Diagnosis not present

## 2022-01-26 DIAGNOSIS — E559 Vitamin D deficiency, unspecified: Secondary | ICD-10-CM | POA: Diagnosis not present

## 2022-01-26 DIAGNOSIS — L4050 Arthropathic psoriasis, unspecified: Secondary | ICD-10-CM | POA: Diagnosis not present

## 2022-01-27 DIAGNOSIS — D721 Eosinophilia, unspecified: Secondary | ICD-10-CM | POA: Diagnosis not present

## 2022-02-10 ENCOUNTER — Telehealth: Payer: Self-pay | Admitting: Internal Medicine

## 2022-02-10 ENCOUNTER — Encounter: Payer: Self-pay | Admitting: Internal Medicine

## 2022-02-10 ENCOUNTER — Ambulatory Visit: Payer: Medicare HMO | Attending: Internal Medicine | Admitting: Internal Medicine

## 2022-02-10 VITALS — BP 125/86 | HR 87 | Ht 64.0 in | Wt 165.4 lb

## 2022-02-10 DIAGNOSIS — I493 Ventricular premature depolarization: Secondary | ICD-10-CM

## 2022-02-10 DIAGNOSIS — I951 Orthostatic hypotension: Secondary | ICD-10-CM

## 2022-02-10 DIAGNOSIS — R0609 Other forms of dyspnea: Secondary | ICD-10-CM | POA: Diagnosis not present

## 2022-02-10 NOTE — Progress Notes (Signed)
Cardiology Office Note:    Date:  02/10/2022   ID:  Ashley Griffith, DOB [birth year], MRN [id number]  PCP:  Aretta Nip, MD   [place] Prov[facility] Cardiologist:  Werner Lean, MD     Referring MD: Aretta Nip, MD   CC: Orthostatic hypotension Consulted for the evaluation of orthostatic hypotension at the behest of Dr. Radene Ou  History of Present Illness:    Ashley Griffith is a 76 y.o. female with a hx of prior PE and DVT for Xarelto, Rare PVCs, and DOE who presents for evaluation.  Patient notes that she is feeling issues taking a deep breath.   Was last feeling well six months ago.. Goes to the gym and feels ok without standing. Bikes at the gym or goes for a walk.  No symptoms.   Has had no chest pain, chest pressure, chest tightness, chest stinging .  Rarely has costochondritis.  Doesn't feel this at the gym.  Notes SOB during the day that is intermittent.  Also work going up steps.  .  No PND or orthopnea.  Lost 20 lbs related to poor appetite.  . Notes heart racing at night.  Felt three times in the last two weeks.   Distant prior stress that was normal.   Past Medical History:  Diagnosis Date   Anxiety    Arthritis    Arthropathic    Blepharitis, right eye    Blood clot in vein    leg [id number]   Depression    Depression    Dry eye syndrome    DVT (deep venous thrombosis) ([place]) 1996   GAD (generalized anxiety disorder)    GERD (gastroesophageal reflux disease)    Graves disease    Hemorrhoids    Patient notes intermittent rectal bleeding from these   High cholesterol    Hypothyroidism    Seen by Dr. Wilson Singer   Insomnia    Migraine    Obesity    Status post significant intentional weight loss in 2003 of > 90 pounds   Osteoarthritis    Osteopenia    Peripheral neuropathy    Personal history of pulmonary embolism    PONV (postoperative nausea and vomiting)    Psoriatic arthritis ([place])    Follows with Dr.  Naida Sleight   Pulmonary embolism [facility]) 06/1993   Rosacea    Rosacea, unspecified    Sleep disturbance    Vitamin D deficiency     Past Surgical History:  Procedure Laterality Date   blood mass removed from abdomen  1972   ? questionable ectopic pregnancy   cataract surgery  2006 and 2007   childbirth  1976   colonscopy     x 2   KNEE ARTHROSCOPY Right 09/18/2012   Procedure: RIGHT ARTHROSCOPY KNEE, Tlateral meniscal debdridement and chondroplasty;  Surgeon: Gearlean Alf, MD;  Location: [facility];  Service: Orthopedics;  Laterality: Right;   LUMBAR LAMINECTOMY/DECOMPRESSION MICRODISCECTOMY Right 09/16/2015   Procedure: Right Lumbar Four-FiveLaminectomy with resection of synovial cyst;  Surgeon: Erline Levine, MD;  Location: [place] [facility];  Service: Neurosurgery;  Laterality: Right;  right   nasal poly removed  1964   OPEN REDUCTION INTERNAL FIXATION (ORIF) METACARPAL Right 08/21/2018   Procedure: OPEN REDUCTION INTERNAL FIXATION (ORIF) AND REPAIR AS INDICATED  RIGHT 5TH METACARPAL;  Surgeon: Iran Planas, MD;  Location: [place];  Service: Orthopedics;  Laterality: Right;  78mn   sclorotomy both legs veins  2005 or 2006  SPHINCTEROTOMY  1990's   superficial keratotomy  2021   TONSILLECTOMY  as child   torn meniscus right knee  2014   TRANSFORAMINAL LUMBAR INTERBODY FUSION (TLIF) WITH PEDICLE SCREW FIXATION 1 LEVEL Left 05/02/2018   Procedure: Left Lumbar four-five Transforaminal lumbar interbody fusion;  Surgeon: Erline Levine, MD;  Location: [place];  Service: Neurosurgery;  Laterality: Left;  Left Lumbar four-five Transforaminal lumbar interbody fusion    Current Medications: Current Meds  Medication Sig   acetaminophen (TYLENOL) 650 MG CR tablet Take 650 mg by mouth in the morning and at bedtime.   Adalimumab (HUMIRA) 40 MG/0.8ML PSKT Inject 0.8 mLs (40 mg total) into the skin every 14 (fourteen) days.   atorvastatin (LIPITOR) 10 MG tablet Take 10 mg by mouth at  bedtime.    Biotin 1000 MCG tablet Take 1,000 mcg by mouth daily.   buPROPion (WELLBUTRIN XL) 300 MG 24 hr tablet Take 300 mg by mouth daily.   Calcium Carbonate 500 MG CHEW Chew 2,000 mg by mouth at bedtime as needed (for reflux or indigestion).    Calcium Citrate-Vitamin D (CALCIUM CITRATE + D PO) Take 2 tablets by mouth in the morning.    celecoxib (CELEBREX) 100 MG capsule Take 100 mg by mouth 2 (two) times daily.    folic acid (FOLVITE) 809 MCG tablet Take 800 mcg by mouth every morning.    gabapentin (NEURONTIN) 300 MG capsule Take 600 mg by mouth at bedtime.    metroNIDAZOLE (METROGEL) 1 % gel Apply 1 application topically 2 (two) times daily. To face   mupirocin ointment (BACTROBAN) 2 % Apply 1 application topically daily.   Omega-3 Fatty Acids (OMEGA-3 FISH OIL PO) Take 1,400 mg by mouth daily.    omeprazole (PRILOSEC) 20 MG capsule Take 20 mg by mouth daily.   rivaroxaban (XARELTO) 20 MG TABS tablet Take 1 tablet (20 mg total) by mouth daily with supper.   SYNTHROID 88 MCG tablet Take 88 mcg by mouth daily.   Vitamin D, Ergocalciferol, (DRISDOL) 1.25 MG (50000 UT) CAPS capsule Take 50,000 Units by mouth every 7 (seven) days.     Allergies:   Erythromycin, Penicillins, Adhesive [tape], Kenalog [triamcinolone acetonide], Monistat [miconazole], Secukinumab, and Thimerosal (thiomersal)   Social History   Socioeconomic History   Marital status: Married    Spouse name: Not on file   Number of children: 1   Years of education: Not on file   Highest education level: Master's degree (e.g., MA, MS, MEng, MEd, MSW, MBA)  Occupational History   Not on file  Tobacco Use   Smoking status: Former    Packs/day: 1.00    Years: 5.00    Total pack years: 5.00    Types: Cigarettes    Quit date: 04/24/1973    Years since quitting: 48.8   Smokeless tobacco: Never  Vaping Use   Vaping Use: Never used  Substance and Sexual Activity   Alcohol use: Yes    Alcohol/week: 1.0 standard drink of  alcohol    Types: 1 Standard drinks or equivalent per week    Comment: social   Drug use: No   Sexual activity: Not on file  Other Topics Concern   Not on file  Social History Narrative   Lives at home with husband and daughter   Caffeine: about 10 oz coffee/day    Social Determinants of Health   Financial Resource Strain: Not on file  Food Insecurity: Not on file  Transportation Needs: Not on file  Physical Activity: Not on file  Stress: Not on file  Social Connections: Not on file     Family History: The patient's family history includes Acute myelogenous leukemia in her brother; Arthritis in an other family member; Cancer in her maternal aunt; Depression in her father; Gallstones in her father; Heart Problems in her mother; Heart attack in her father; Kidney failure in her mother; Obesity in an other family member; Ulcerative colitis in her mother; Varicose Veins in her mother.  ROS:   Please see the history of present illness.     All other systems reviewed and are negative.  EKGs/Labs/Other Studies Reviewed:    The following studies were reviewed today:   EKG:  EKG is  ordered today.  The ekg ordered today demonstrates  02/10/22: SR with rare PACs and LAE  Recent Labs: No results found for requested labs within last 365 days.  Recent Lipid Panel No results found for: "CHOL", "TRIG", "HDL", "CHOLHDL", "VLDL", "LDLCALC", "LDLDIRECT"       Physical Exam:    VS:  BP 125/86   Pulse 87   Ht '5\' 4"'$  (1.626 m)   Wt 165 lb 6.4 oz (75 kg)   SpO2 95%   BMI 28.39 kg/m     Orthostatic Vitals: Supine:  BP 136/84  HR 75 Sitting:  BP 125/86  HR 87 Standing:  BP 110/74  HR 87 Prolonged Stand:  BP 112/84  HR 91   Wt Readings from Last 3 Encounters:  02/10/22 165 lb 6.4 oz (75 kg)  02/05/20 151 lb (68.5 kg)  08/21/18 162 lb 0.6 oz (73.5 kg)    GEN:  Well nourished, well developed in no acute distress HEENT: Normal NECK: No JVD LYMPHATICS: No  lymphadenopathy CARDIAC: RRR, no murmurs, rubs, gallops RESPIRATORY:  Clear to auscultation without rales, wheezing or rhonchi  ABDOMEN: Soft, non-tender, non-distended MUSCULOSKELETAL:  No edema; No deformity  SKIN: Warm and dry NEUROLOGIC:  Alert and oriented x 3 PSYCHIATRIC:  Normal affect   ASSESSMENT:    1. Orthostatic hypotension   2. PVC (premature ventricular contraction)   3. DOE (dyspnea on exertion)    PLAN:    Orthostatic Hypotension - symptomatic - stress the importance of salt and water intake - gave education on slow rise, Valsalva maneuver exacerbation, temperature change - discussed muscle contraction and leg crossing - discussed elevation to 30-45 degrees when sleeping - Discussed exercise deconditioning exacerbates this issue, optimally benefits from continuing biking - discussed abdominal binders and compression stockings (she was less excited about this)  DOE Fatigue - will get echo; if still sx and normal echo could get trial of low dose fludrocortisone (0.05) - at next visit will consider Lexiscan SPECT testing if persistent fatigue  Palpitations PVCs - rare but symptomatic - will get ziopatch  HLD - control  on Lipitor.       Medication Adjustments/Labs and Tests Ordered: Current medicines are reviewed at length with the patient today.  Concerns regarding medicines are outlined above.  Orders Placed This Encounter  Procedures   LONG TERM MONITOR (3-14 DAYS)   EKG 12-Lead   ECHOCARDIOGRAM COMPLETE   No orders of the defined types were placed in this encounter.   Patient Instructions  Medication Instructions:  Your physician recommends that you continue on your current medications as directed. Please refer to the Current Medication list given to you today.  *If you need a refill on your cardiac medications before your next appointment, please call your pharmacy*  Lab Work: NONE If you have labs (blood work) drawn today and your tests  are completely normal, you will receive your results only by: [place] (if you have MyChart) OR A paper copy in the mail If you have any lab test that is abnormal or we need to change your treatment, we will call you to review the results.   Testing/Procedures: Your physician has requested that you have an echocardiogram. Echocardiography is a painless test that uses sound waves to create images of your heart. It provides your doctor with information about the size and shape of your heart and how well your heart's chambers and valves are working. This procedure takes approximately one hour. There are no restrictions for this procedure. Please do NOT wear cologne, perfume, aftershave, or lotions (deodorant is allowed). Please arrive 15 minutes prior to your appointment time.   Your physician has requested that you wear a 14 day heart monitor.   Follow-Up: At [facility], you and your health needs are our priority.  As part of our continuing mission to provide you with exceptional heart care, we have created designated Provider Care Teams.  These Care Teams include your primary Cardiologist (physician) and Advanced Practice Providers (APPs -  Physician Assistants and Nurse Practitioners) who all work together to provide you with the care you need, when you need it.   Your next appointment:   4 month(s)  The format for your next appointment:   In Person  Provider:   Melina Copa, PA-C, Ambrose Pancoast, NP, or Ermalinda Barrios, PA-C     Then, Werner Lean, MD will plan to see you again in 8 month(s).    Other Instructions ZIO XT- Long Term Monitor Instructions  Your physician has requested you wear a ZIO patch monitor for 14 days.  This is a single patch monitor. [employer] supplies one patch monitor per enrollment. Additional stickers are not available. Please do not apply patch if you will be having a Nuclear Stress Test,  Echocardiogram, Cardiac CT, MRI, or Chest Xray  during the period you would be wearing the  monitor. The patch cannot be worn during these tests. You cannot remove and re-apply the  ZIO XT patch monitor.  Your ZIO patch monitor will be mailed 3 day USPS to your address on file. It may take 3-5 days  to receive your monitor after you have been enrolled.  Once you have received your monitor, please review the enclosed instructions. Your monitor  has already been registered assigning a specific monitor serial # to you.  Billing and Patient Assistance Program Information  We have supplied [employer] with any of your insurance information on file for billing purposes. [employer] offers a sliding scale Patient Assistance Program for patients that do not have  insurance, or whose insurance does not completely cover the cost of the ZIO monitor.  You must apply for the Patient Assistance Program to qualify for this discounted rate.  To apply, please call [employer] at [phone], select option 4, select option 2, ask to apply for  Patient Assistance Program. Theodore Demark will ask your household income, and how many people  are in your household. They will quote your out-of-pocket cost based on that information.  [employer] will also be able to set up a 60-month interest-free payment plan if needed.  Applying the monitor   Shave hair from upper left chest.  Hold abrader disc by orange tab. Rub abrader in 40 strokes over the upper left chest as  indicated in your monitor instructions.  Clean area with 4 enclosed alcohol pads. Let dry.  Apply patch as indicated in monitor instructions. Patch will be placed under collarbone on left  side of chest with arrow pointing upward.  Rub patch adhesive wings for 2 minutes. Remove white label marked "1". Remove the white  label marked "2". Rub patch adhesive wings for 2 additional minutes.  While looking in a mirror, press and release button in center of patch. A small green light will  flash 3-4 times. This will be  your only indicator that the monitor has been turned on.  Do not shower for the first 24 hours. You may shower after the first 24 hours.  Press the button if you feel a symptom. You will hear a small click. Record Date, Time and  Symptom in the Patient Logbook.  When you are ready to remove the patch, follow instructions on the last 2 pages of Patient  Logbook. Stick patch monitor onto the last page of Patient Logbook.  Place Patient Logbook in the blue and white box. Use locking tab on box and tape box closed  securely. The blue and white box has prepaid postage on it. Please place it in the mailbox as  soon as possible. Your physician should have your test results approximately 7 days after the  monitor has been mailed back to [facility].  Call [place] at [phone] if you have questions regarding  your ZIO XT patch monitor. Call them immediately if you see an orange light blinking on your  monitor.  If your monitor falls off in less than 4 days, contact our Monitor department at [phone].  If your monitor becomes loose or falls off after 4 days call [employer] at [phone] for  suggestions on securing your monitor   Important Information About Sugar         Signed, Werner Lean, MD  02/10/2022 2:47 PM    [place]

## 2022-02-10 NOTE — Telephone Encounter (Signed)
Called pt advised that paper with weight was placed in the shred bin.  Advised pt to have Echo tech weigh her prior to Echo on 02/24/22.  We will be able to put weight in computer then.  Pt reports will make sure to weigh at visit. No further concerns voiced at this time.

## 2022-02-10 NOTE — Telephone Encounter (Signed)
Patient states her weight in her chart is not correct. She says she weighed 135 lbs not 165 lbs. She would like a call back when it has been updated.

## 2022-02-10 NOTE — Patient Instructions (Signed)
Medication Instructions:  Your physician recommends that you continue on your current medications as directed. Please refer to the Current Medication list given to you today.  *If you need a refill on your cardiac medications before your next appointment, please call your pharmacy*   Lab Work: NONE If you have labs (blood work) drawn today and your tests are completely normal, you will receive your results only by: [place] (if you have MyChart) OR A paper copy in the mail If you have any lab test that is abnormal or we need to change your treatment, we will call you to review the results.   Testing/Procedures: Your physician has requested that you have an echocardiogram. Echocardiography is a painless test that uses sound waves to create images of your heart. It provides your doctor with information about the size and shape of your heart and how well your heart's chambers and valves are working. This procedure takes approximately one hour. There are no restrictions for this procedure. Please do NOT wear cologne, perfume, aftershave, or lotions (deodorant is allowed). Please arrive 15 minutes prior to your appointment time.   Your physician has requested that you wear a 14 day heart monitor.   Follow-Up: At [facility], you and your health needs are our priority.  As part of our continuing mission to provide you with exceptional heart care, we have created designated Provider Care Teams.  These Care Teams include your primary Cardiologist (physician) and Advanced Practice Providers (APPs -  Physician Assistants and Nurse Practitioners) who all work together to provide you with the care you need, when you need it.   Your next appointment:   4 month(s)  The format for your next appointment:   In Person  Provider:   Melina Copa, PA-C, Ambrose Pancoast, NP, or Ermalinda Barrios, PA-C     Then, Werner Lean, MD will plan to see you again in 8 month(s).    Other  Instructions ZIO XT- Long Term Monitor Instructions  Your physician has requested you wear a ZIO patch monitor for 14 days.  This is a single patch monitor. [employer] supplies one patch monitor per enrollment. Additional stickers are not available. Please do not apply patch if you will be having a Nuclear Stress Test,  Echocardiogram, Cardiac CT, MRI, or Chest Xray during the period you would be wearing the  monitor. The patch cannot be worn during these tests. You cannot remove and re-apply the  ZIO XT patch monitor.  Your ZIO patch monitor will be mailed 3 day USPS to your address on file. It may take 3-5 days  to receive your monitor after you have been enrolled.  Once you have received your monitor, please review the enclosed instructions. Your monitor  has already been registered assigning a specific monitor serial # to you.  Billing and Patient Assistance Program Information  We have supplied [employer] with any of your insurance information on file for billing purposes. [employer] offers a sliding scale Patient Assistance Program for patients that do not have  insurance, or whose insurance does not completely cover the cost of the ZIO monitor.  You must apply for the Patient Assistance Program to qualify for this discounted rate.  To apply, please call [employer] at [phone], select option 4, select option 2, ask to apply for  Patient Assistance Program. Theodore Demark will ask your household income, and how many people  are in your household. They will quote your out-of-pocket cost based on that information.  [employer] will also be able to set up a 106-month interest-free payment plan if needed.  Applying the monitor   Shave hair from upper left chest.  Hold abrader disc by orange tab. Rub abrader in 40 strokes over the upper left chest as  indicated in your monitor instructions.  Clean area with 4 enclosed alcohol pads. Let dry.  Apply patch as indicated in monitor instructions. Patch will be  placed under collarbone on left  side of chest with arrow pointing upward.  Rub patch adhesive wings for 2 minutes. Remove white label marked "1". Remove the white  label marked "2". Rub patch adhesive wings for 2 additional minutes.  While looking in a mirror, press and release button in center of patch. A small green light will  flash 3-4 times. This will be your only indicator that the monitor has been turned on.  Do not shower for the first 24 hours. You may shower after the first 24 hours.  Press the button if you feel a symptom. You will hear a small click. Record Date, Time and  Symptom in the Patient Logbook.  When you are ready to remove the patch, follow instructions on the last 2 pages of Patient  Logbook. Stick patch monitor onto the last page of Patient Logbook.  Place Patient Logbook in the blue and white box. Use locking tab on box and tape box closed  securely. The blue and white box has prepaid postage on it. Please place it in the mailbox as  soon as possible. Your physician should have your test results approximately 7 days after the  monitor [facility]  Call [place] [phone] you have questions regarding  your ZIO XT patch monitor. Call them immediately if you see an orange light blinking on your  monitor.  If your monitor falls off in less than 4 days, contact our Monitor department at [phone]  If your monitor becomes loose or falls off after 4 days call [employer] at [phone]  suggestions on securing your monitor   Important Information About Sugar

## 2022-02-13 ENCOUNTER — Ambulatory Visit: Payer: Medicare HMO | Attending: Internal Medicine

## 2022-02-13 DIAGNOSIS — I951 Orthostatic hypotension: Secondary | ICD-10-CM

## 2022-02-13 DIAGNOSIS — I493 Ventricular premature depolarization: Secondary | ICD-10-CM

## 2022-02-13 DIAGNOSIS — R0609 Other forms of dyspnea: Secondary | ICD-10-CM

## 2022-02-13 NOTE — Progress Notes (Unsigned)
Enrolled for [employer] to mail a ZIO XT long term holter monitor to the patients address on file.  

## 2022-02-15 ENCOUNTER — Other Ambulatory Visit (HOSPITAL_BASED_OUTPATIENT_CLINIC_OR_DEPARTMENT_OTHER): Payer: Self-pay

## 2022-02-15 MED ORDER — COMIRNATY 30 MCG/0.3ML IM SUSY
PREFILLED_SYRINGE | INTRAMUSCULAR | 0 refills | Status: DC
  Filled 2022-02-15: qty 0.3, 1d supply, fill #0

## 2022-02-24 ENCOUNTER — Ambulatory Visit (HOSPITAL_COMMUNITY): Payer: Medicare HMO | Attending: Internal Medicine

## 2022-02-24 DIAGNOSIS — I951 Orthostatic hypotension: Secondary | ICD-10-CM | POA: Insufficient documentation

## 2022-02-24 DIAGNOSIS — I493 Ventricular premature depolarization: Secondary | ICD-10-CM | POA: Diagnosis not present

## 2022-02-24 DIAGNOSIS — R0609 Other forms of dyspnea: Secondary | ICD-10-CM | POA: Insufficient documentation

## 2022-02-24 LAB — ECHOCARDIOGRAM COMPLETE
Area-P 1/2: 2.99 cm2
P 1/2 time: 560 msec
S' Lateral: 2.8 cm

## 2022-02-25 DIAGNOSIS — R0609 Other forms of dyspnea: Secondary | ICD-10-CM | POA: Diagnosis not present

## 2022-02-25 DIAGNOSIS — I493 Ventricular premature depolarization: Secondary | ICD-10-CM

## 2022-02-25 DIAGNOSIS — I951 Orthostatic hypotension: Secondary | ICD-10-CM

## 2022-03-02 ENCOUNTER — Other Ambulatory Visit (HOSPITAL_BASED_OUTPATIENT_CLINIC_OR_DEPARTMENT_OTHER): Payer: Self-pay

## 2022-03-02 MED ORDER — AREXVY 120 MCG/0.5ML IM SUSR
INTRAMUSCULAR | 0 refills | Status: DC
  Filled 2022-03-02: qty 0.5, 1d supply, fill #0

## 2022-03-20 DIAGNOSIS — I951 Orthostatic hypotension: Secondary | ICD-10-CM | POA: Diagnosis not present

## 2022-05-03 DIAGNOSIS — H18522 Epithelial (juvenile) corneal dystrophy, left eye: Secondary | ICD-10-CM | POA: Diagnosis not present

## 2022-05-03 DIAGNOSIS — Z9889 Other specified postprocedural states: Secondary | ICD-10-CM | POA: Diagnosis not present

## 2022-05-03 DIAGNOSIS — H0288B Meibomian gland dysfunction left eye, upper and lower eyelids: Secondary | ICD-10-CM | POA: Diagnosis not present

## 2022-05-03 DIAGNOSIS — L718 Other rosacea: Secondary | ICD-10-CM | POA: Diagnosis not present

## 2022-05-03 DIAGNOSIS — Z961 Presence of intraocular lens: Secondary | ICD-10-CM | POA: Diagnosis not present

## 2022-05-03 DIAGNOSIS — H0288A Meibomian gland dysfunction right eye, upper and lower eyelids: Secondary | ICD-10-CM | POA: Diagnosis not present

## 2022-05-03 DIAGNOSIS — H43811 Vitreous degeneration, right eye: Secondary | ICD-10-CM | POA: Diagnosis not present

## 2022-05-22 DIAGNOSIS — E559 Vitamin D deficiency, unspecified: Secondary | ICD-10-CM | POA: Diagnosis not present

## 2022-05-22 DIAGNOSIS — Z79899 Other long term (current) drug therapy: Secondary | ICD-10-CM | POA: Diagnosis not present

## 2022-05-22 DIAGNOSIS — L4050 Arthropathic psoriasis, unspecified: Secondary | ICD-10-CM | POA: Diagnosis not present

## 2022-05-22 DIAGNOSIS — M8000XD Age-related osteoporosis with current pathological fracture, unspecified site, subsequent encounter for fracture with routine healing: Secondary | ICD-10-CM | POA: Diagnosis not present

## 2022-05-22 DIAGNOSIS — Z6822 Body mass index (BMI) 22.0-22.9, adult: Secondary | ICD-10-CM | POA: Diagnosis not present

## 2022-05-22 DIAGNOSIS — M1710 Unilateral primary osteoarthritis, unspecified knee: Secondary | ICD-10-CM | POA: Diagnosis not present

## 2022-05-22 DIAGNOSIS — M25531 Pain in right wrist: Secondary | ICD-10-CM | POA: Diagnosis not present

## 2022-05-22 DIAGNOSIS — L408 Other psoriasis: Secondary | ICD-10-CM | POA: Diagnosis not present

## 2022-05-22 DIAGNOSIS — Z111 Encounter for screening for respiratory tuberculosis: Secondary | ICD-10-CM | POA: Diagnosis not present

## 2022-05-22 DIAGNOSIS — M542 Cervicalgia: Secondary | ICD-10-CM | POA: Diagnosis not present

## 2022-05-25 DIAGNOSIS — Z6823 Body mass index (BMI) 23.0-23.9, adult: Secondary | ICD-10-CM | POA: Diagnosis not present

## 2022-05-25 DIAGNOSIS — K4190 Unilateral femoral hernia, without obstruction or gangrene, not specified as recurrent: Secondary | ICD-10-CM | POA: Diagnosis not present

## 2022-06-08 NOTE — Progress Notes (Signed)
Office Visit    Patient Name: Ashley Griffith Date of Encounter: 06/08/2022  Primary Care Provider:  Aretta Nip, Griffith Primary Cardiologist:  Ashley Lean, Griffith Primary Electrophysiologist: None  Chief Complaint    Ashley Griffith is a 77 y.o. female with PMH of ascending aortic dilation PE and DVT currently on Xarelto, DOE, rare PVCs, anxiety, arthritis, GERD who presents today for 5-monthfollow-up of orthostatic hypotension and DOE.  Past Medical History    Past Medical History:  Diagnosis Date   Anxiety    Arthritis    Arthropathic    Blepharitis, right eye    Blood clot in vein    leg 11-23-1004   Depression    Depression    Dry eye syndrome    [place]   GAD (generalized anxiety disorder)    GERD (gastroesophageal reflux disease)    Graves disease    Hemorrhoids    Patient notes intermittent rectal bleeding from these   High cholesterol    Hypothyroidism    Seen by Dr. KWilson Griffith  Insomnia    Migraine    Obesity    Status post significant intentional weight loss in 2003 of > 90 pounds   Osteoarthritis    Osteopenia    Peripheral neuropathy    Personal history of pulmonary embolism    PONV (postoperative nausea and vomiting)    [place]    Follows with Dr. BNaida Griffith  Pulmonary embolism ([facility] 06/1993   Rosacea    Rosacea, unspecified    Sleep disturbance    Vitamin D deficiency    Past Surgical History:  Procedure Laterality Date   blood mass removed from abdomen  1972   ? questionable ectopic pregnancy   cataract surgery  2006 and 2007   childbirth  1976   colonscopy     x 2   KNEE ARTHROSCOPY Right 09/18/2012   Procedure: RIGHT ARTHROSCOPY KNEE, Tlateral meniscal debdridement and chondroplasty;  Surgeon: FGearlean Alf Griffith;  Location: [facility];  Service: Orthopedics;  Laterality: Right;   LUMBAR LAMINECTOMY/DECOMPRESSION MICRODISCECTOMY Right 09/16/2015   Procedure: Right Lumbar  Four-FiveLaminectomy with resection of synovial cyst;  Surgeon: Ashley Griffith;  [place] [facility];  Service: Neurosurgery;  Laterality: Right;  right   nasal poly removed  1964   OPEN REDUCTION INTERNAL FIXATION (ORIF) METACARPAL Right 08/21/2018   Procedure: OPEN REDUCTION INTERNAL FIXATION (ORIF) AND REPAIR AS INDICATED  RIGHT 5TH METACARPAL;  Surgeon: Ashley Griffith;  [place]  Service: Orthopedics;  Laterality: Right;  944m   sclorotomy both legs veins  2005 or 2006   SPHINCTEROTOMY  1990's   superficial keratotomy  2021   TONSILLECTOMY  as child   torn meniscus right knee  2014   TRANSFORAMINAL LUMBAR INTERBODY FUSION (TLIF) WITH PEDICLE SCREW FIXATION 1 LEVEL Left 05/02/2018   Procedure: Left Lumbar four-five Transforaminal lumbar interbody fusion;  Surgeon: Ashley Griffith;  [place] [facility] Service: Neurosurgery;  Laterality: Left;  Left Lumbar four-five Transforaminal lumbar interbody fusion    Allergies  Allergies  Allergen Reactions   Erythromycin Hives and Nausea And Vomiting   Penicillins Swelling, Rash and Other (See Comments)    PATIENT HAS HAD A PCN REACTION WITH IMMEDIATE RASH, FACIAL/TONGUE/THROAT SWELLING, SOB, OR LIGHTHEADEDNESS WITH HYPOTENSION:  #  #  YES  #  #  Has patient had a PCN reaction causing severe rash involving mucus  membranes or skin necrosis: No Has patient had a PCN reaction that required hospitalization No Has patient had a PCN reaction occurring within the last 10 years: No If all of the above answers are "NO", then may proceed with Cephalosporin use. 02/05/2020 pt states she does not recall swelling   Adhesive [Tape] Rash   Kenalog [Triamcinolone Acetonide] Other (See Comments)    Facial flush   Monistat [Miconazole] Itching and Other (See Comments)    Burning   Secukinumab Itching   Thimerosal (Thiomersal) Itching and Other (See Comments)    Red and blurry eyes    History of Present Illness    Ashley Griffith  is a 77 year old female with the above mention past medical history who presents today for 53-monthfollow-up of orthostatic hypotension and DOE.  Ms. Ashley Griffith was initially seen by Ashley Griffith 02/10/2022 for complaint of orthostasis and dyspnea on exertion.  She noted dyspnea on exertion intermittently during the day worse with going up steps.  She had a reported 20 pound weight loss due to poor appetite and complaint of heart racing at night.  2D echo was obtained and showed EF of 60 to 65%, no RWMA, grade 1 DD with TV R and mild to moderate AVR with no evidence of stenosis.  There was also a 41 mm ascending aortic aneurysm noted.  ZIO monitor results showed predominant sinus rhythm with paroxysms of SVT which were nonsustained.  She was not started on any medication due to complaints of orthostasis.  Since last being seen in the office patient reports that she is feeling much better with no frequent palpitations or dyspnea on exertion.  She is still experiencing orthostasis when she has prolonged standing such as cooking or shopping in the store she denies any presyncope and states that it passes with rest.  She reports that her symptoms first occurred with sensation of weakness and then dizziness.  Her blood pressures today were elevated initially at 168/92 and was 142/90 on recheck.  She is currently not checking her blood pressures at home and is unaware if her pressures have been elevated since her previous visit.  She is compliant with her medications and denies any bleeding concerns with her Xarelto.  During her visit we discussed the pathophysiology of orthostasis and also covered the importance of maintaining good blood pressure with her aortic dilation.  She had all questions answered to her satisfaction..  Patient denies chest pain, palpitations, dyspnea, PND, orthopnea, nausea, vomiting, dizziness, syncope, edema, weight gain, or early satiety.  Home Medications    Current  Outpatient Medications  Medication Sig Dispense Refill   acetaminophen (TYLENOL) 650 MG CR tablet Take 650 mg by mouth in the morning and at bedtime.     Adalimumab (HUMIRA) 40 MG/0.8ML PSKT Inject 0.8 mLs (40 mg total) into the skin every 14 (fourteen) days.     atorvastatin (LIPITOR) 10 MG tablet Take 10 mg by mouth at bedtime.      Biotin 1000 MCG tablet Take 1,000 mcg by mouth daily.     buPROPion (WELLBUTRIN XL) 300 MG 24 hr tablet Take 300 mg by mouth daily.     Calcium Carbonate 500 MG CHEW Chew 2,000 mg by mouth at bedtime as needed (for reflux or indigestion).      Calcium Citrate-Vitamin D (CALCIUM CITRATE + D PO) Take 2 tablets by mouth in the morning.      celecoxib (CELEBREX) 100 MG capsule Take 100 mg by  mouth 2 (two) times daily.      COVID-19 mRNA vaccine 2023-2024 (COMIRNATY) syringe Inject into the muscle. 0.3 mL 0   folic acid (FOLVITE) [id number] MCG tablet Take 800 mcg by mouth every morning.      gabapentin (NEURONTIN) 300 MG capsule Take 600 mg by mouth at bedtime.      metroNIDAZOLE (METROGEL) 1 % gel Apply 1 application topically 2 (two) times daily. To face     mupirocin ointment (BACTROBAN) 2 % Apply 1 application topically daily. 30 g 0   Omega-3 Fatty Acids (OMEGA-3 FISH OIL PO) Take 1,400 mg by mouth daily.      omeprazole (PRILOSEC) 20 MG capsule Take 20 mg by mouth daily.     rivaroxaban (XARELTO) 20 MG TABS tablet Take 1 tablet (20 mg total) by mouth daily with supper.     RSV vaccine recomb adjuvanted (AREXVY) 120 MCG/0.5ML injection Inject into the muscle. 0.5 mL 0   SYNTHROID 88 MCG tablet Take 88 mcg by mouth daily.  4   Vitamin D, Ergocalciferol, (DRISDOL) 1.25 MG (50000 UT) CAPS capsule Take 50,000 Units by mouth every 7 (seven) days.     No current facility-administered medications for this visit.     Review of Systems  Please see the history of present illness.    (+) Presyncope (+) Palpitations, weakness  All other systems reviewed and are otherwise  negative except as noted above.  Physical Exam    Wt Readings from Last 3 Encounters:  02/10/22 165 lb 6.4 oz (75 kg)  02/05/20 151 lb (68.5 kg)  08/21/18 162 lb 0.6 oz (73.5 kg)   [id number] were no vitals filed for this visit.,There is no height or weight on file to calculate BMI.  Constitutional:      Appearance: Healthy appearance. Not in distress.  Neck:     Vascular: JVD normal.  Pulmonary:     Effort: Pulmonary effort is normal.     Breath sounds: No wheezing. No rales. Diminished in the bases Cardiovascular:     Normal rate. Regular rhythm. Normal S1. Normal S2.      Murmurs: There is no murmur.  Edema:    Peripheral edema absent.  Abdominal:     Palpations: Abdomen is soft non tender. There is no hepatomegaly.  Skin:    General: Skin is warm and dry.  Neurological:     General: No focal deficit present.     Mental Status: Alert and oriented to person, place and time.     Cranial Nerves: Cranial nerves are intact.  EKG/LABS/Other Studies Reviewed    ECG personally reviewed by me today -none completed today  Lab Results  Component Value Date   WBC 5.6 02/05/2020   HGB 14.3 02/05/2020   HCT 42.2 02/05/2020   MCV 100 (H) 02/05/2020   PLT 211 02/05/2020   Lab Results  Component Value Date   CREATININE 0.77 02/05/2020   BUN 15 02/05/2020   NA 139 02/05/2020   K 4.3 02/05/2020   CL 100 02/05/2020   CO2 27 02/05/2020   No results found for: "ALT", "AST", "GGT", "ALKPHOS", "BILITOT" No results found for: "CHOL", "HDL", "LDLCALC", "LDLDIRECT", "TRIG", "CHOLHDL"  Lab Results  Component Value Date   HGBA1C 5.7 (H) 02/05/2020    Assessment & Plan    1.  Dyspnea on exertion: -2D echo was completed and showed normal EF with mild TVR and mild to moderate AVR -Today patient reports that she has not had any  episodes of shortness of breath since her previous visit.   2.  Orthostatic hypotension -Patient's blood pressure today was elevated at 168/92 and was still  elevated on second check at 142/90. -She is currently not checking blood pressures at home and was advised to purchase a cuff and check her blood pressures twice a day for the next 2 weeks. -We discussed the possibility of adding a blood pressure agent if her pressures remain elevated. -She also reports experiencing weakness and dizziness with prolonged standing.  She denies any syncope but has experienced some presyncope. -She was encouraged to purchase compression stockings and also increase her fluid intake to at least 64 ounces a day.  3.  Paroxysmal SVT: -Zio patch revealed nonsustained SVT with no triggered events associated with rhythm. -Today patient reports occasional episodes of palpitations but no prolonged activity. -She was encouraged to report any episodes that are symptomatic and prolonged in duration.  4.  Elevated blood pressure -Patient's blood pressure today was elevated at 168/92 and was still elevated on second check at 142/90. -She is currently not checking blood pressures at home and was advised to purchase a cuff and check her blood pressures twice a day for the next 2 weeks. -We discussed the possibility of adding a blood pressure agent if her pressures remain elevated.  5.    Disposition: Follow-up with Ashley Lean, Griffith or APP in 1 months    Medication Adjustments/Labs and Tests Ordered: Current medicines are reviewed at length with the patient today.  Concerns regarding medicines are outlined above.   Signed, Mable Fill, Marissa Nestle, NP 06/08/2022, 12:25 PM [facility]  Note:  This document was prepared using Dragon voice recognition software and may include unintentional dictation errors.

## 2022-06-09 ENCOUNTER — Ambulatory Visit: Payer: Medicare HMO | Attending: Nurse Practitioner | Admitting: Nurse Practitioner

## 2022-06-09 ENCOUNTER — Encounter: Payer: Self-pay | Admitting: Nurse Practitioner

## 2022-06-09 VITALS — BP 142/90 | HR 69 | Ht 64.0 in | Wt 135.6 lb

## 2022-06-09 DIAGNOSIS — I493 Ventricular premature depolarization: Secondary | ICD-10-CM | POA: Diagnosis not present

## 2022-06-09 DIAGNOSIS — R030 Elevated blood-pressure reading, without diagnosis of hypertension: Secondary | ICD-10-CM

## 2022-06-09 DIAGNOSIS — R0609 Other forms of dyspnea: Secondary | ICD-10-CM

## 2022-06-09 DIAGNOSIS — I951 Orthostatic hypotension: Secondary | ICD-10-CM

## 2022-06-09 NOTE — Patient Instructions (Signed)
Medication Instructions:  Your physician recommends that you continue on your current medications as directed. Please refer to the Current Medication list given to you today. *If you need a refill on your cardiac medications before your next appointment, please call your pharmacy*   Lab Work: None ordered   Testing/Procedures: None ordered   Follow-Up: At [facility], you and your health needs are our priority.  As part of our continuing mission to provide you with exceptional heart care, we have created designated Provider Care Teams.  These Care Teams include your primary Cardiologist (physician) and Advanced Practice Providers (APPs -  Physician Assistants and Nurse Practitioners) who all work together to provide you with the care you need, when you need it.  We recommend signing up for the patient portal called "MyChart".  Sign up information is provided on this After Visit Summary.  MyChart is used to connect with patients for Virtual Visits (Telemedicine).  Patients are able to view lab/test results, encounter notes, upcoming appointments, etc.  Non-urgent messages can be sent to your provider as well.   To learn more about what you can do with MyChart, go to [email or web address].    Your next appointment:   1 month(s)  Provider:   Ambrose Pancoast, NP        Other Instructions  Check your blood pressure twice a day for 2 weeks then contact the office with your readings

## 2022-06-14 ENCOUNTER — Ambulatory Visit: Payer: Self-pay | Admitting: General Surgery

## 2022-06-14 ENCOUNTER — Telehealth: Payer: Self-pay | Admitting: *Deleted

## 2022-06-14 DIAGNOSIS — K4090 Unilateral inguinal hernia, without obstruction or gangrene, not specified as recurrent: Secondary | ICD-10-CM | POA: Diagnosis not present

## 2022-06-14 NOTE — H&P (Signed)
Chief Complaint: New Consultation (Unilateral femoral hernia w/o obstruction or gangrene)       History of Present Illness: Ashley Griffith is a 77 y.o. female who is seen today as an office consultation at the request of Ashley Griffith for evaluation of New Consultation (Unilateral femoral hernia w/o obstruction or gangrene) .   Patient is a 77 year old female with a history of dyspnea on exertion, DVT/PE on Xarelto.  Who comes in secondary to a left inguinal hernia.  Patient does have a history of previous Pfannenstiel incision for likely ectopic pregnancy evacuation in the past.   Patient states she had 6 weeks history of left inguinal bulge.  States that she has no pain to the area but has noticed its gotten larger.  She does state reseed on its own.   She has a chronic history of constipation.  She has been taking MiraLAX as well as prune juice.         Review of Systems: A complete review of systems was obtained from the patient.  I have reviewed this information and discussed as appropriate with the patient.  See HPI as well for other ROS.   Review of Systems  Constitutional:  Negative for fever.  HENT:  Negative for congestion.   Eyes:  Negative for blurred vision.  Respiratory:  Negative for cough, shortness of breath and wheezing.   Cardiovascular:  Negative for chest pain and palpitations.  Gastrointestinal:  Negative for heartburn.  Genitourinary:  Negative for dysuria.  Musculoskeletal:  Negative for myalgias.  Skin:  Negative for rash.  Neurological:  Negative for dizziness and headaches.  Psychiatric/Behavioral:  Negative for depression and suicidal ideas.   All other systems reviewed and are negative.       Medical History: Past Medical History Past Medical History: Diagnosis Date  Anxiety    Arthritis    GERD (gastroesophageal reflux disease)    Hyperlipidemia    Thyroid disease        There is no problem list on file for this patient.     Past  Surgical History History reviewed. No pertinent surgical history.     Allergies Allergies Allergen Reactions  Erythromycin Other (See Comments), Hives and Nausea And Vomiting  Penicillin Hives  Adhesive Rash and Itching  Adhesive Tape-Silicones Rash  Secukinumab Itching, Other (See Comments) and Dermatitis     unknown  Thimerosal Itching, Other (See Comments) and Unknown     Other Reaction(s): turned eyes red and blurry   Red and blurry eyes   Red and blurry eyes  Red eyes   Red and blurry eyes, Red eyes  Triamcinolone Acetonide Other (See Comments)     Facial flush      Current Outpatient Medications on File Prior to Visit Medication Sig Dispense Refill  acetaminophen (TYLENOL) 650 MG ER tablet Take by mouth      biotin 10,000 mcg Cap 1 tablet Orally Once a day      buPROPion (WELLBUTRIN XL) 300 MG XL tablet Take by mouth      busPIRone (BUSPAR) 5 MG tablet Take 5 mg by mouth 2 (two) times daily      calcium carbonate (TUMS) 200 mg calcium (500 mg) chewable tablet Take by mouth      clotrimazole-betamethasone (LOTRISONE) 1-0.05 % cream APPLY TWICE A DAY FOR 10 DAYS      ergocalciferol, vitamin D2, 1,250 mcg (50,000 unit) capsule Take 50,000 Units by mouth every 7 (seven) days  gabapentin (NEURONTIN) 100 MG capsule Take by mouth      leflunomide (ARAVA) 20 MG tablet Take 1 tablet by mouth once daily      levothyroxine (SYNTHROID) 88 MCG tablet 1 tablet in the morning on an empty stomach Orally Once a day for 90 days      omeprazole (PRILOSEC) 40 MG DR capsule Take by mouth      XARELTO 20 mg tablet TAKE 1 TABLET DAILY WITH   FOOD for 90       No current facility-administered medications on file prior to visit.     Family History History reviewed. No pertinent family history.     Social History   Tobacco Use Smoking Status Not on file Smokeless Tobacco Not on file     Social History Social History    Socioeconomic History  Marital status: Married       Objective:     Vitals:   06/14/22 1404 06/14/22 1405 BP: (!) 165/85   Pulse: 71   Temp: 36.8 C (98.2 F)   SpO2: 93%   Weight: 61.3 kg (135 lb 3.2 oz)   Height: 163.8 cm (5' 4.5")   PainSc:   0-No pain   Body mass index is 22.85 kg/m. Physical Exam Constitutional:      Appearance: Normal appearance.  HENT:     Head: Normocephalic and atraumatic.     Mouth/Throat:     Mouth: Mucous membranes are moist.     Pharynx: Oropharynx is clear.  Eyes:     General: No scleral icterus.    Pupils: Pupils are equal, round, and reactive to light.  Cardiovascular:     Rate and Rhythm: Normal rate and regular rhythm.     Pulses: Normal pulses.     Heart sounds: No murmur heard.    No friction rub. No gallop.  Pulmonary:     Effort: Pulmonary effort is normal. No respiratory distress.     Breath sounds: Normal breath sounds. No stridor.  Abdominal:     General: Abdomen is flat.     Hernia: A hernia is present. Hernia is present in the left inguinal area.  Musculoskeletal:        General: No swelling.  Skin:    General: Skin is warm.  Neurological:     General: No focal deficit present.     Mental Status: She is alert and oriented to person, place, and time. Mental status is at baseline.  Psychiatric:        Mood and Affect: Mood normal.        Thought Content: Thought content normal.        Judgment: Judgment normal.          Assessment and Plan: Diagnoses and all orders for this visit:   Unilateral inguinal hernia without obstruction or gangrene, recurrence not specified     Ashley Griffith is a 77 y.o. female    1.  We will proceed to the OR for a laparoscopic versus open left inguinal hernia repair with mesh. 2. All risks and benefits were discussed with the patient, to generally include infection, bleeding, damage to surrounding structures, acute and chronic nerve pain, and recurrence. Alternatives were offered and described.  All questions were answered and the  patient voiced understanding of the procedure and wishes to proceed at this point.             No follow-ups on file.   Ashley Ok, MD, [facility]imally Invasive Surgery

## 2022-06-14 NOTE — Telephone Encounter (Signed)
   Pre-operative Risk Assessment    Patient Name: Ashley Griffith  DOB: May 30, 1945 MRN: [id number]      Request for Surgical Clearance    Procedure:   HERNIA SURGERY  Date of Surgery:  Clearance TBD                                 Surgeon:  Ralene Ok, MD Surgeon's Group or Practice Name:  [facility] Phone number:  [phone] Fax number:  [phone]  [place], CMA   Type of Clearance Requested:   - Medical  - Pharmacy:  Hold Rivaroxaban (Xarelto) NOT INDICATED   Type of Anesthesia:  General    Additional requests/questions:    Astrid Divine   06/14/2022, 2:32 PM

## 2022-06-14 NOTE — Telephone Encounter (Signed)
Ashley Griffith,  Ashley Griffith has been doing well without any new cardiac symptoms. They are able to achieve 4 METS without cardiac limitations. Therefore, based on ACC/AHA guidelines, the patient would be at acceptable risk for the planned procedure without further cardiovascular testing.   Thanks,  Jaquelyn Bitter

## 2022-06-14 NOTE — Telephone Encounter (Signed)
Ashley Griffith, you recently saw this patient on 06/09/2022 and we will see her back in follow-up in a few weeks. Could you please comment on cardiac  clearance for upcoming hernia surgery and route your response to p cv div preop?  Xarelto is prescribed for DVT therefore request to hold should be addressed with PCP.  [name], NP-C  06/14/2022, 2:42 PM [address], [address] Office [phone] Fax [phone]

## 2022-06-15 NOTE — Telephone Encounter (Signed)
   Patient Name: Ashley Griffith  DOB: 01-Jun-1945 MRN: [id number]  Primary Cardiologist: Werner Lean, MD  Chart reviewed as part of pre-operative protocol coverage. Given past medical history and time since last visit, based on ACC/AHA guidelines, Dvora P Winegarden is at acceptable risk for the planned procedure without further cardiovascular testing.    Xarelto is prescribed for DVT therefore request to hold should be addressed with PCP.   I will route this recommendation to the requesting party via Epic fax function and remove from pre-op pool.  Please call with questions.  Lenna Sciara, NP 06/15/2022, 11:04 AM

## 2022-06-23 DIAGNOSIS — R69 Illness, unspecified: Secondary | ICD-10-CM | POA: Diagnosis not present

## 2022-06-27 ENCOUNTER — Other Ambulatory Visit: Payer: Self-pay

## 2022-06-27 ENCOUNTER — Emergency Department (HOSPITAL_BASED_OUTPATIENT_CLINIC_OR_DEPARTMENT_OTHER): Payer: Medicare HMO | Admitting: Radiology

## 2022-06-27 ENCOUNTER — Encounter (HOSPITAL_BASED_OUTPATIENT_CLINIC_OR_DEPARTMENT_OTHER): Payer: Self-pay | Admitting: Emergency Medicine

## 2022-06-27 ENCOUNTER — Emergency Department (HOSPITAL_BASED_OUTPATIENT_CLINIC_OR_DEPARTMENT_OTHER)
Admission: EM | Admit: 2022-06-27 | Discharge: 2022-06-27 | Disposition: A | Discharge status: Home / Self Care | Payer: Medicare HMO | Attending: Emergency Medicine | Admitting: Emergency Medicine

## 2022-06-27 ENCOUNTER — Telehealth: Payer: Self-pay | Admitting: Nurse Practitioner

## 2022-06-27 DIAGNOSIS — M7989 Other specified soft tissue disorders: Secondary | ICD-10-CM | POA: Diagnosis not present

## 2022-06-27 DIAGNOSIS — Z86711 Personal history of pulmonary embolism: Secondary | ICD-10-CM | POA: Insufficient documentation

## 2022-06-27 DIAGNOSIS — Y9301 Activity, walking, marching and hiking: Secondary | ICD-10-CM | POA: Insufficient documentation

## 2022-06-27 DIAGNOSIS — S92352A Displaced fracture of fifth metatarsal bone, left foot, initial encounter for closed fracture: Secondary | ICD-10-CM | POA: Insufficient documentation

## 2022-06-27 DIAGNOSIS — S92302A Fracture of unspecified metatarsal bone(s), left foot, initial encounter for closed fracture: Secondary | ICD-10-CM

## 2022-06-27 DIAGNOSIS — M25572 Pain in left ankle and joints of left foot: Secondary | ICD-10-CM | POA: Diagnosis not present

## 2022-06-27 DIAGNOSIS — R030 Elevated blood-pressure reading, without diagnosis of hypertension: Secondary | ICD-10-CM

## 2022-06-27 DIAGNOSIS — W19XXXA Unspecified fall, initial encounter: Secondary | ICD-10-CM | POA: Diagnosis not present

## 2022-06-27 DIAGNOSIS — Z7901 Long term (current) use of anticoagulants: Secondary | ICD-10-CM | POA: Diagnosis not present

## 2022-06-27 DIAGNOSIS — S82832A Other fracture of upper and lower end of left fibula, initial encounter for closed fracture: Secondary | ICD-10-CM

## 2022-06-27 DIAGNOSIS — S99922A Unspecified injury of left foot, initial encounter: Secondary | ICD-10-CM | POA: Diagnosis present

## 2022-06-27 DIAGNOSIS — Y92003 Bedroom of unspecified non-institutional (private) residence as the place of occurrence of the external cause: Secondary | ICD-10-CM | POA: Diagnosis not present

## 2022-06-27 MED ORDER — OXYCODONE-ACETAMINOPHEN 5-325 MG PO TABS: 1.0000 | ORAL_TABLET | Freq: Three times a day (TID) | ORAL | 0 refills | Status: DC | PRN

## 2022-06-27 MED ORDER — OXYCODONE-ACETAMINOPHEN 5-325 MG PO TABS
1.0000 | ORAL_TABLET | Freq: Once | ORAL | Status: AC
  Administered 2022-06-27: 1 via ORAL
  Filled 2022-06-27: qty 1

## 2022-06-27 MED ORDER — HYDROCODONE-ACETAMINOPHEN 5-325 MG PO TABS
1.0000 | ORAL_TABLET | Freq: Once | ORAL | Status: DC
  Filled 2022-06-27: qty 1

## 2022-06-27 NOTE — ED Provider Notes (Signed)
[facility] Provider Note   CSN: [id number] Arrival date & time: 06/27/22  1903     History {Add pertinent medical, surgical, social history, OB history to HPI:1} Chief Complaint  Patient presents with   Fall   Ankle Pain    [place] is a 77 y.o. female.  Presents to the ED for left ankle injury.  States she was walking on carpet in the bedroom upstairs today and fell down after tripping.  Denies dizziness or loss consciousness, no head injury.  She does take Xarelto for history of PE. Numbness tingling or weakness.  Denies any neck pain or back pain.  No other injuries.  Not able to bear weight.   Fall  Ankle Pain      Home Medications Prior to Admission medications   Medication Sig Start Date End Date Taking? Authorizing Provider  acetaminophen (TYLENOL) 650 MG CR tablet Take 650 mg by mouth in the morning and at bedtime.    [provider]  atorvastatin (LIPITOR) 20 MG tablet Take 20 mg by mouth daily.    [provider]  Biotin 1000 MCG tablet Take 1,000 mcg by mouth daily.    [provider]  buPROPion (WELLBUTRIN XL) 300 MG 24 hr tablet Take 300 mg by mouth daily. 01/13/20   [provider]  Calcium Carbonate 500 MG CHEW Chew 2,000 mg by mouth at bedtime as needed (for reflux or indigestion).     [provider]  Calcium Citrate-Vitamin D (CALCIUM CITRATE + D PO) Take 2 tablets by mouth in the morning.     [provider]  celecoxib (CELEBREX) 100 MG capsule Take 100 mg by mouth 2 (two) times daily.     [provider]  diazepam (VALIUM) 2 MG tablet Take 2 mg by mouth. 01/12/22   [provider]  folic acid (FOLVITE) Q000111Q MCG tablet Take 800 mcg by mouth every morning.     [provider]  gabapentin (NEURONTIN) 300 MG capsule Take 600 mg by mouth at bedtime.     [provider]  metroNIDAZOLE (METROGEL) 1 % gel Apply 1 application  topically 2 (two) times daily. To face    [provider]  Omega-3 Fatty Acids (OMEGA-3 FISH OIL PO) Take 1,400 mg by mouth daily.     [provider]  omeprazole (PRILOSEC) 20 MG capsule Take 20 mg by mouth daily.    [provider]  rivaroxaban (XARELTO) 20 MG TABS tablet Take 1 tablet (20 mg total) by mouth daily with supper. 05/10/18   Costella, Vincent J, PA-C  SYNTHROID 88 MCG tablet Take 88 mcg by mouth daily. 07/08/15   [provider]  TREMFYA 100 MG/ML SOSY Inject into the skin. 05/08/22   [provider]  Vitamin D, Ergocalciferol, (DRISDOL) 1.25 MG (50000 UT) CAPS capsule Take 50,000 Units by mouth every 7 (seven) days.    [provider]      Allergies    Erythromycin, Penicillins, Thimerosal, Adhesive [tape], Buspirone hcl, Kenalog [triamcinolone acetonide], Monistat [miconazole], Secukinumab, and Thimerosal (thiomersal)    Review of Systems   Review of Systems  Physical Exam Updated Vital Signs BP (!) 162/99 (BP Location: Right Arm)   Pulse (!) 102   Temp 97.8 F (36.6 C) (Oral)   Resp 18   Ht '5\' 4"'$  (1.626 m)   Wt 58.1 kg   SpO2 98%   BMI 21.97 kg/m  Physical Exam Vitals and  nursing note reviewed.  Constitutional:      General: She is not in acute distress.    Appearance: She is well-developed.  HENT:     Head: Normocephalic and atraumatic.     Mouth/Throat:     Mouth: Mucous membranes are moist.  Eyes:     Conjunctiva/sclera: Conjunctivae normal.  Cardiovascular:     Rate and Rhythm: Normal rate and regular rhythm.     Heart sounds: No murmur heard. Pulmonary:     Effort: Pulmonary effort is normal. No respiratory distress.     Breath sounds: Normal breath sounds.  Abdominal:     Palpations: Abdomen is soft.  Musculoskeletal:        General: No swelling.     Cervical back: Neck supple. No tenderness.     Comments: Left ankle has significant swelling at lateral malleolus with tenderness in this area and  DP and PT pulses are intact sensation intact, tenderness at proximal fifth metatarsal as well.  Capillary refill is brisk in the left foot  Skin:    General: Skin is warm and dry.     Capillary Refill: Capillary refill takes less than 2 seconds.  Neurological:     General: No focal deficit present.     Mental Status: She is alert and oriented to person, place, and time.  Psychiatric:        Mood and Affect: Mood normal.     ED Results / Procedures / Treatments   Labs (all labs ordered are listed, but only abnormal results are displayed) Labs Reviewed - No data to display  EKG None  Radiology DG Ankle Complete Left  Result Date: 06/27/2022 CLINICAL DATA:  Left ankle pain and swelling after fall. EXAM: LEFT ANKLE COMPLETE - 3+ VIEW; LEFT FOOT - COMPLETE 3+ VIEW COMPARISON:  Left foot x-rays dated June 07, 2017. FINDINGS: Acute nondisplaced transverse fracture of the distal fibular metaphysis with prominent overlying soft tissue swelling/hematoma. Acute avulsion fracture at the base of the fifth metatarsal with mild overlying soft tissue swelling. No additional fracture. No dislocation. 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 IMPRESSION: 1. Acute nondisplaced fracture of the distal fibular metaphysis. 2. Acute avulsion fracture at the base of the fifth metatarsal. Electronically Signed   By: Titus Dubin M.D.   On: 06/27/2022 20:26   DG Foot Complete Left  Result Date: 06/27/2022 CLINICAL DATA:  Left ankle pain and swelling after fall. EXAM: LEFT ANKLE COMPLETE - 3+ VIEW; LEFT FOOT - COMPLETE 3+ VIEW COMPARISON:  Left foot x-rays dated June 07, 2017. FINDINGS: Acute nondisplaced transverse fracture of the distal fibular metaphysis with prominent overlying soft tissue swelling/hematoma. Acute avulsion fracture at the base of the fifth metatarsal with mild overlying soft tissue swelling. No additional fracture. No dislocation.  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 IMPRESSION: 1. Acute nondisplaced fracture of the distal fibular metaphysis. 2. Acute avulsion fracture at the base of the fifth metatarsal. Electronically Signed   By: Titus Dubin M.D.   On: 06/27/2022 20:26    Procedures .Ortho Injury Treatment  Date/Time: 06/27/2022 11:37 PM  Performed by: Gwenevere Abbot, PA-C Authorized by: Gwenevere Abbot, PA-C   Consent:    Consent obtained:  Verbal   Consent given by:  Patient   Risks discussed:  Restricted joint movementInjury location: lower leg Location details: left lower leg Injury type: fracture Fracture type: lateral malleolus Pre-procedure neurovascular assessment: neurovascularly intact Pre-procedure distal perfusion: normal Pre-procedure neurological  function: normal     {Document cardiac monitor, telemetry assessment procedure when appropriate:1}  Medications Ordered in ED Medications  HYDROcodone-acetaminophen (NORCO/VICODIN) 5-325 MG per tablet 1 tablet (has no administration in time range)    ED Course/ Medical Decision Making/ A&P   {   Click here for ABCD2, HEART and other calculatorsREFRESH Note before signing :1}                          Medical Decision Making This patient presents to the ED for concern of ankle injury after a mechanical fall today, this involves an extensive number of treatment options, and is a complaint that carries with it a high risk of complications and morbidity.  The differential diagnosis includes fracture, strain, sprain, contusion, other   Co morbidities that complicate the patient evaluation  Anticoagulant use   Additional history obtained:  Additional history obtained from MR External records from outside source obtained and reviewed including outpatient notes prior     Imaging Studies ordered:  I ordered imaging studies including x-ray left ankle and foot I independently  visualized and interpreted imaging which showed nondisplaced fracture left distal fibula and avulsion fracture fifth metatarsal I agree with the radiologist interpretation     Problem List / ED Course / Critical interventions / Medication management  Fracture left ankle-patient has nondisplaced ankle fracture with of metatarsal avulsion fracture.  Put in a stirrup and posterior splint.  Patient's pain was well-controlled with oxycodone.  Given anticoagulant use will avoid NSAIDs.  Advised on orthopedic follow-up.  Patient advised on nonweightbearing status.  She does not want to use Crutches because she does not feel she needs them safely but states she does think she can use her walker.  Does not live alone. I ordered medication including oxycodone for pain  Reevaluation of the patient after these medicines showed that the patient improved I have reviewed the patients home medicines and have made adjustments as needed      Risk Prescription drug management.     {Document critical care time when appropriate:1} {Document review of labs and clinical decision tools ie heart score, Chads2Vasc2 etc:1}  {Document your independent review of radiology images, and any outside records:1} {Document your discussion with family members, caretakers, and with consultants:1} {Document social determinants of health affecting pt's care:1} {Document your decision making why or why not admission, treatments were needed:1} Final Clinical Impression(s) / ED Diagnoses Final diagnoses:  None    Rx / DC Orders ED Discharge Orders     None

## 2022-06-27 NOTE — Telephone Encounter (Signed)
Please let Ms.Burgueno know that I reviewed her blood pressure readings and her pressure is remaining above baseline of 130/80.  We we will start her on HCTZ 12.5 mg daily.  Please have her come in to the lab and 1 week for BMET.  Please have her continue to check her blood pressures and I will see her at her follow-up on 07/11/2022.  Ambrose Pancoast, NP

## 2022-06-27 NOTE — ED Notes (Signed)
Short leg splint with stirrup applied, pt denies discomfort, good cap refill, sensation intact.

## 2022-06-27 NOTE — Discharge Instructions (Signed)
Have a nondisplaced fracture in your left ankle and a small avulsion fracture in your foot.  Fracture is not displaced.  You were put in a splint.  Do not put any weight on your ankle and follow-up closely with orthopedic doctor.  You can take the oxycodone as needed for pain.  Do not take it at the same time as your diazepam as this can make you too sleepy.  Come back to the ER if you have any new or worsening symptoms.

## 2022-06-27 NOTE — ED Triage Notes (Signed)
Patient c/o mechanical fall earlier this evening.  Patient reports left ankle pain and swelling.  Patient has obvious swelling to left ankle.  Patient is on xarelto, patient did not hit head.

## 2022-06-29 MED ORDER — HYDROCHLOROTHIAZIDE 12.5 MG PO CAPS: 12.5000 mg | ORAL_CAPSULE | Freq: Every day | ORAL | 3 refills | Status: DC

## 2022-06-29 NOTE — Telephone Encounter (Signed)
Spoke with the patient and made her aware of Ernest's recommendations.   She voiced understanding.   She states she broke her ankle yesterday and is not currently mobile. She states she has an appointment with her orthopedic tomorrow so she will find out if she is having surgery or not. She states if possible she will have labs next week. Advised the patient that if she can not get her next week we will do labs at her next appointment.   Labs orders placed.   Rx(s) sent to pharmacy electronically.

## 2022-06-29 NOTE — Addendum Note (Signed)
Addended by: Whitman Hero on: 06/29/2022 05:25 PM   Modules accepted: Orders

## 2022-06-30 DIAGNOSIS — M25572 Pain in left ankle and joints of left foot: Secondary | ICD-10-CM | POA: Diagnosis not present

## 2022-07-10 NOTE — Progress Notes (Unsigned)
Office Visit    Patient Name: Ashley Griffith Date of Encounter: 07/10/2022  Primary Care Provider:  Faustino Congress, NP Primary Cardiologist:  Werner Lean, MD Primary Electrophysiologist: None  Chief Complaint    Ashley Griffith is a 77 y.o. female with PMH of ascending aortic dilation PE and DVT currently on Xarelto, DOE, rare PVCs, anxiety, arthritis, GERD who presents today for 1 month follow-up of orthostatic hypotension and dyspnea on exertion.  Past Medical History    Past Medical History:  Diagnosis Date   Anxiety    Arthritis    Arthropathic    Blepharitis, right eye    Blood clot in vein    leg [id number]   Depression    Depression    Dry eye syndrome    DVT (deep venous thrombosis) ([place]) 1996   GAD (generalized anxiety disorder)    GERD (gastroesophageal reflux disease)    Graves disease    Hemorrhoids    Patient notes intermittent rectal bleeding from these   High cholesterol    Hypothyroidism    Seen by Dr. Wilson Singer   Insomnia    Migraine    Obesity    Status post significant intentional weight loss in 2003 of > 90 pounds   Osteoarthritis    Osteopenia    Peripheral neuropathy    Personal history of pulmonary embolism    PONV (postoperative nausea and vomiting)    Psoriatic arthritis ([place])    Follows with Dr. Naida Sleight   Pulmonary embolism [facility]) 06/1993   Rosacea    Rosacea, unspecified    Sleep disturbance    Vitamin D deficiency    Past Surgical History:  Procedure Laterality Date   blood mass removed from abdomen  1972   ? questionable ectopic pregnancy   cataract surgery  2006 and 2007   childbirth  1976   colonscopy     x 2   KNEE ARTHROSCOPY Right 09/18/2012   Procedure: RIGHT ARTHROSCOPY KNEE, Tlateral meniscal debdridement and chondroplasty;  Surgeon: Gearlean Alf, MD;  Location: [facility];  Service: Orthopedics;  Laterality: Right;   LUMBAR LAMINECTOMY/DECOMPRESSION MICRODISCECTOMY Right 09/16/2015   Procedure:  Right Lumbar Four-FiveLaminectomy with resection of synovial cyst;  Surgeon: Erline Levine, MD;  Location: [place] [facility];  Service: Neurosurgery;  Laterality: Right;  right   nasal poly removed  1964   OPEN REDUCTION INTERNAL FIXATION (ORIF) METACARPAL Right 08/21/2018   Procedure: OPEN REDUCTION INTERNAL FIXATION (ORIF) AND REPAIR AS INDICATED  RIGHT 5TH METACARPAL;  Surgeon: Iran Planas, MD;  Location: [place];  Service: Orthopedics;  Laterality: Right;  81min   sclorotomy both legs veins  2005 or 2006   SPHINCTEROTOMY  1990's   superficial keratotomy  2021   TONSILLECTOMY  as child   torn meniscus right knee  2014   TRANSFORAMINAL LUMBAR INTERBODY FUSION (TLIF) WITH PEDICLE SCREW FIXATION 1 LEVEL Left 05/02/2018   Procedure: Left Lumbar four-five Transforaminal lumbar interbody fusion;  Surgeon: Erline Levine, MD;  Location: [place];  Service: Neurosurgery;  Laterality: Left;  Left Lumbar four-five Transforaminal lumbar interbody fusion    Allergies  Allergies  Allergen Reactions   Erythromycin Hives and Nausea And Vomiting   Penicillins Swelling, Rash and Other (See Comments)    PATIENT HAS HAD A PCN REACTION WITH IMMEDIATE RASH, FACIAL/TONGUE/THROAT SWELLING, SOB, OR LIGHTHEADEDNESS WITH HYPOTENSION:  #  #  YES  #  #  Has patient had a PCN reaction causing severe rash  involving mucus membranes or skin necrosis: No Has patient had a PCN reaction that required hospitalization No Has patient had a PCN reaction occurring within the last 10 years: No If all of the above answers are "NO", then may proceed with Cephalosporin use. 02/05/2020 pt states she does not recall swelling   Thimerosal     Other Reaction(s): turned eyes red and blurry   Adhesive [Tape] Rash   Buspirone Hcl Rash   Kenalog [Triamcinolone Acetonide] Other (See Comments)    Facial flush   Monistat [Miconazole] Itching and Other (See Comments)    Burning   Secukinumab Itching   Thimerosal (Thiomersal)  Itching and Other (See Comments)    Red and blurry eyes    History of Present Illness    Ashley Griffith  is a 77 year old female with the above mention past medical history who presents today for 14-month follow-up of orthostatic hypotension and DOE.  Ashley Griffith was initially seen by Dr. Gasper Sells on 02/10/2022 for complaint of orthostasis and dyspnea on exertion.  She noted dyspnea on exertion intermittently during the day worse with going up steps.  She had a reported 20 pound weight loss due to poor appetite and complaint of heart racing at night.  2D echo was obtained and showed EF of 60 to 65%, no RWMA, grade 1 DD with TV R and mild to moderate AVR with no evidence of stenosis.  There was also a 41 mm ascending aortic aneurysm noted.  ZIO monitor results showed predominant sinus rhythm with paroxysms of SVT which were nonsustained.  She was not started on any medication due to complaints of orthostasis.  She was seen on 06/09/2022 for follow-up.  She reported still experiencing orthostasis at that time.  Blood pressures during visit however were elevated at 168/92 and remained elevated 142/90 on recheck.  She was started on HCTZ 12.5 mg daily and patient suffered an ankle fracture after falling over her car.  In her home.  She denied any dizziness or loss of consciousness.  Since last being seen in the office patient reports***.  Patient denies chest pain, palpitations, dyspnea, PND, orthopnea, nausea, vomiting, dizziness, syncope, edema, weight gain, or early satiety.     ***Notes: -Patient was started on HCTZ last visit and broke her ankle since previous visit. Home Medications    Current Outpatient Medications  Medication Sig Dispense Refill   acetaminophen (TYLENOL) 650 MG CR tablet Take 650 mg by mouth in the morning and at bedtime.     atorvastatin (LIPITOR) 20 MG tablet Take 20 mg by mouth daily.     Biotin 1000 MCG tablet Take 1,000 mcg by mouth daily.     buPROPion  (WELLBUTRIN XL) 300 MG 24 hr tablet Take 300 mg by mouth daily.     Calcium Carbonate 500 MG CHEW Chew 2,000 mg by mouth at bedtime as needed (for reflux or indigestion).      Calcium Citrate-Vitamin D (CALCIUM CITRATE + D PO) Take 2 tablets by mouth in the morning.      celecoxib (CELEBREX) 100 MG capsule Take 100 mg by mouth 2 (two) times daily.      diazepam (VALIUM) 2 MG tablet Take 2 mg by mouth.     folic acid (FOLVITE) [id number] MCG tablet Take 800 mcg by mouth every morning.      gabapentin (NEURONTIN) 300 MG capsule Take 600 mg by mouth at bedtime.      hydrochlorothiazide (MICROZIDE) 12.5 MG capsule Take  1 capsule (12.5 mg total) by mouth daily. 30 capsule 3   metroNIDAZOLE (METROGEL) 1 % gel Apply 1 application topically 2 (two) times daily. To face     Omega-3 Fatty Acids (OMEGA-3 FISH OIL PO) Take 1,400 mg by mouth daily.      omeprazole (PRILOSEC) 20 MG capsule Take 20 mg by mouth daily.     oxyCODONE-acetaminophen (PERCOCET/ROXICET) 5-325 MG tablet Take 1 tablet by mouth every 8 (eight) hours as needed for severe pain. 8 tablet 0   rivaroxaban (XARELTO) 20 MG TABS tablet Take 1 tablet (20 mg total) by mouth daily with supper.     SYNTHROID 88 MCG tablet Take 88 mcg by mouth daily.  4   TREMFYA 100 MG/ML SOSY Inject into the skin.     Vitamin D, Ergocalciferol, (DRISDOL) 1.25 MG (50000 UT) CAPS capsule Take 50,000 Units by mouth every 7 (seven) days.     No current facility-administered medications for this visit.     Review of Systems  Please see the history of present illness.    (+)*** (+)***  All other systems reviewed and are otherwise negative except as noted above.  Physical Exam    Wt Readings from Last 3 Encounters:  06/27/22 128 lb (58.1 kg)  06/09/22 135 lb 9.6 oz (61.5 kg)  02/10/22 165 lb 6.4 oz (75 kg)   [id number] were no vitals filed for this visit.,There is no height or weight on file to calculate BMI.  Constitutional:      Appearance: Healthy appearance.  Not in distress.  Neck:     Vascular: JVD normal.  Pulmonary:     Effort: Pulmonary effort is normal.     Breath sounds: No wheezing. No rales. Diminished in the bases Cardiovascular:     Normal rate. Regular rhythm. Normal S1. Normal S2.      Murmurs: There is no murmur.  Edema:    Peripheral edema absent.  Abdominal:     Palpations: Abdomen is soft non tender. There is no hepatomegaly.  Skin:    General: Skin is warm and dry.  Neurological:     General: No focal deficit present.     Mental Status: Alert and oriented to person, place and time.     Cranial Nerves: Cranial nerves are intact.  EKG/LABS/ Recent Cardiac Studies    ECG personally reviewed by me today - ***  Risk Assessment/Calculations:   {Does this patient have ATRIAL FIBRILLATION?:[phone]}        Lab Results  Component Value Date   WBC 5.6 02/05/2020   HGB 14.3 02/05/2020   HCT 42.2 02/05/2020   MCV 100 (H) 02/05/2020   PLT 211 02/05/2020   Lab Results  Component Value Date   CREATININE 0.77 02/05/2020   BUN 15 02/05/2020   NA 139 02/05/2020   K 4.3 02/05/2020   CL 100 02/05/2020   CO2 27 02/05/2020   No results found for: "ALT", "AST", "GGT", "ALKPHOS", "BILITOT" No results found for: "CHOL", "HDL", "LDLCALC", "LDLDIRECT", "TRIG", "CHOLHDL"  Lab Results  Component Value Date   HGBA1C 5.7 (H) 02/05/2020    Cardiac Studies & Procedures       ECHOCARDIOGRAM  ECHOCARDIOGRAM COMPLETE 02/24/2022  Narrative ECHOCARDIOGRAM REPORT    Patient Name:   Ashley Griffith Date of Exam: 02/24/2022 Medical Rec #:  [id number]            Height:       64.0 in Accession #:    [id number]  Weight:       165.4 lb Date of Birth:  [birth year]            BSA:          1.805 m Patient Age:    71 years             BP:           125/86 mmHg Patient Gender: F                    HR:           70 bpm. Exam Location:  [address]  Procedure: 2D Echo, Cardiac Doppler and Color  Doppler  Indications:    R06.09 Dyspnea on Exertion  History:        Patient has no prior history of Echocardiogram examinations. Risk Factors:Former Smoker. Pulmonary embolism.  Sonographer:    Cresenciano Lick RDCS Referring Phys: [id number] [facility] A CHANDRASEKHAR  IMPRESSIONS   1. Left ventricular ejection fraction, by estimation, is 60 to 65%. The left ventricle has normal function. The left ventricle has no regional wall motion abnormalities. Left ventricular diastolic parameters are consistent with Grade I diastolic dysfunction (impaired relaxation). 2. Right ventricular systolic function is normal. The right ventricular size is normal. Tricuspid regurgitation signal is inadequate for assessing PA pressure. 3. The mitral valve is degenerative. No evidence of mitral valve regurgitation. 4. Aortic valve regurgitation is mild to moderate. Aortic valve sclerosis is present, with no evidence of aortic valve stenosis. 5. Aneurysm of the ascending aorta, measuring 41 mm. 6. The inferior vena cava is normal in size with greater than 50% respiratory variability, suggesting right atrial pressure of 3 mmHg.  Comparison(s): No prior Echocardiogram.  FINDINGS Left Ventricle: Left ventricular ejection fraction, by estimation, is 60 to 65%. The left ventricle has normal function. The left ventricle has no regional wall motion abnormalities. The left ventricular internal cavity size was normal in size. There is no left ventricular hypertrophy. Left ventricular diastolic parameters are consistent with Grade I diastolic dysfunction (impaired relaxation).  Right Ventricle: The right ventricular size is normal. Right ventricular systolic function is normal. Tricuspid regurgitation signal is inadequate for assessing PA pressure.  Left Atrium: Left atrial size was normal in size.  Right Atrium: Right atrial size was normal in size.  Pericardium: There is no evidence of pericardial  effusion.  Mitral Valve: The mitral valve is degenerative in appearance. No evidence of mitral valve regurgitation.  Tricuspid Valve: The tricuspid valve is grossly normal. Tricuspid valve regurgitation is not demonstrated.  Aortic Valve: Aortic valve regurgitation is mild to moderate. Aortic regurgitation PHT measures 560 msec. Aortic valve sclerosis is present, with no evidence of aortic valve stenosis.  Pulmonic Valve: Pulmonic valve regurgitation is mild to moderate.  Aorta: There is an aneurysm involving the ascending aorta measuring 41 mm.  Venous: The inferior vena cava is normal in size with greater than 50% respiratory variability, suggesting right atrial pressure of 3 mmHg.  IAS/Shunts: No atrial level shunt detected by color flow Doppler.   LEFT VENTRICLE PLAX 2D LVIDd:         4.00 cm   Diastology LVIDs:         2.80 cm   LV e' medial:    5.77 cm/s LV PW:         0.80 cm   LV E/e' medial:  5.8 LV IVS:        0.90 cm   LV e'  lateral:   7.94 cm/s LVOT diam:     1.90 cm   LV E/e' lateral: 4.2 LV SV:         68 LV SV Index:   38 LVOT Area:     2.84 cm   RIGHT VENTRICLE             IVC RV Basal diam:  3.40 cm     IVC diam: 1.70 cm RV S prime:     11.55 cm/s TAPSE (M-mode): 2.7 cm  LEFT ATRIUM             Index        RIGHT ATRIUM           Index LA diam:        3.60 cm 1.99 cm/m   RA Area:     12.40 cm LA Vol (A2C):   50.1 ml 27.76 ml/m  RA Volume:   30.30 ml  16.79 ml/m LA Vol (A4C):   30.4 ml 16.85 ml/m LA Biplane Vol: 41.2 ml 22.83 ml/m AORTIC VALVE LVOT Vmax:   119.00 cm/s LVOT Vmean:  76.800 cm/s LVOT VTI:    0.240 m AI PHT:      560 msec  AORTA Ao Root diam: 3.20 cm Ao Asc diam:  4.10 cm  MITRAL VALVE MV Area (PHT): 2.99 cm    SHUNTS MV Decel Time: 254 msec    Systemic VTI:  0.24 m MV E velocity: 33.50 cm/s  Systemic Diam: 1.90 cm MV A velocity: 64.70 cm/s MV E/A ratio:  0.52  Landscape architect signed by Phineas Inches Signature  Date/Time: 02/24/2022/4:35:58 PM    Final    MONITORS  LONG TERM MONITOR (3-14 DAYS) 03/23/2022  Narrative   Patient had a minimum heart rate of 49 bpm, maximum heart rate of 179 bpm, and average heart rate of 77 bpm.   Predominant underlying rhythm was sinus rhythm.   Paroxysms of regular, supraventricular tachycardia.   Longest episode 32 seconds.  Symptoms were not triggered on device during this.   Isolated PACs were rare (<1.0%).   Isolated PVCs were occasional (2%).   Triggered and diary events associated with sinus rhythm, sinus tachycardia, PACs, and PVCs.  Paroxsymal SVT without device trigger for symptoms.           Assessment & Plan    1.  Dyspnea on exertion: -2D echo was completed and showed normal EF with mild TVR and mild to moderate AVR -Today patient reports that she has not had any episodes of shortness of breath since her previous visit.     2.  Orthostatic hypotension -Patient's blood pressure today was elevated at 168/92 and was still elevated on second check at 142/90. -She is currently not checking blood pressures at home and was advised to purchase a cuff and check her blood pressures twice a day for the next 2 weeks. -We discussed the possibility of adding a blood pressure agent if her pressures remain elevated. -She also reports experiencing weakness and dizziness with prolonged standing.  She denies any syncope but has experienced some presyncope. -She was encouraged to purchase compression stockings and also increase her fluid intake to at least 64 ounces a day.   3.  Paroxysmal SVT: -Zio patch revealed nonsustained SVT with no triggered events associated with rhythm. -Today patient reports occasional episodes of palpitations but no prolonged activity. -She was encouraged to report any episodes that are symptomatic and prolonged in duration.   4.  Elevated blood pressure -Patient's blood pressure today was elevated at 168/92 and was still elevated  on second check at 142/90. -She is currently not checking blood pressures at home and was advised to purchase a cuff and check her blood pressures twice a day for the next 2 weeks. -We discussed the possibility of adding a blood pressure agent if her pressures remain elevated.      Disposition: Follow-up with Werner Lean, MD or APP in *** months {Are you ordering a CV Procedure (e.g. stress test, cath, DCCV, TEE, etc)?   Press F2        :[id number]   Medication Adjustments/Labs and Tests Ordered: Current medicines are reviewed at length with the patient today.  Concerns regarding medicines are outlined above.   Signed, Mable Fill, Marissa Nestle, NP 07/10/2022, 12:06 PM [facility]  Note:  This document was prepared using Dragon voice recognition software and may include unintentional dictation errors.

## 2022-07-11 ENCOUNTER — Ambulatory Visit: Payer: Medicare HMO | Attending: Nurse Practitioner | Admitting: Nurse Practitioner

## 2022-07-11 ENCOUNTER — Encounter: Payer: Self-pay | Admitting: Nurse Practitioner

## 2022-07-11 VITALS — BP 134/82 | HR 76 | Ht 64.0 in | Wt 130.0 lb

## 2022-07-11 DIAGNOSIS — R009 Unspecified abnormalities of heart beat: Secondary | ICD-10-CM

## 2022-07-11 DIAGNOSIS — R030 Elevated blood-pressure reading, without diagnosis of hypertension: Secondary | ICD-10-CM | POA: Diagnosis not present

## 2022-07-11 DIAGNOSIS — I471 Supraventricular tachycardia, unspecified: Secondary | ICD-10-CM

## 2022-07-11 DIAGNOSIS — I951 Orthostatic hypotension: Secondary | ICD-10-CM

## 2022-07-11 DIAGNOSIS — R0609 Other forms of dyspnea: Secondary | ICD-10-CM | POA: Diagnosis not present

## 2022-07-11 NOTE — Patient Instructions (Addendum)
Medication Instructions:  Your physician recommends that you continue on your current medications as directed. Please refer to the Current Medication list given to you today. *If you need a refill on your cardiac medications before your next appointment, please call your pharmacy*   Lab Work: TODAY-BMET If you have labs (blood work) drawn today and your tests are completely normal, you will receive your results only by: [place] (if you have MyChart) OR A paper copy in the mail If you have any lab test that is abnormal or we need to change your treatment, we will call you to review the results.   Testing/Procedures: NONE ORDERED   Follow-Up: At [facility], you and your health needs are our priority.  As part of our continuing mission to provide you with exceptional heart care, we have created designated Provider Care Teams.  These Care Teams include your primary Cardiologist (physician) and Advanced Practice Providers (APPs -  Physician Assistants and Nurse Practitioners) who all work together to provide you with the care you need, when you need it.  We recommend signing up for the patient portal called "MyChart".  Sign up information is provided on this After Visit Summary.  MyChart is used to connect with patients for Virtual Visits (Telemedicine).  Patients are able to view lab/test results, encounter notes, upcoming appointments, etc.  Non-urgent messages can be sent to your provider as well.   To learn more about what you can do with MyChart, go to [email or web address].    Your next appointment:    FOLLOW UP AS SCHEDULED  Provider:   Werner Lean, MD     Other Instructions  Check your blood pressure daily for 2 weeks, then contact the office with your readings.

## 2022-07-12 DIAGNOSIS — K4190 Unilateral femoral hernia, without obstruction or gangrene, not specified as recurrent: Secondary | ICD-10-CM | POA: Diagnosis not present

## 2022-07-12 DIAGNOSIS — D84821 Immunodeficiency due to drugs: Secondary | ICD-10-CM | POA: Diagnosis not present

## 2022-07-12 DIAGNOSIS — S82892D Other fracture of left lower leg, subsequent encounter for closed fracture with routine healing: Secondary | ICD-10-CM | POA: Diagnosis not present

## 2022-07-12 DIAGNOSIS — Z7901 Long term (current) use of anticoagulants: Secondary | ICD-10-CM | POA: Diagnosis not present

## 2022-07-12 DIAGNOSIS — R69 Illness, unspecified: Secondary | ICD-10-CM | POA: Diagnosis not present

## 2022-07-12 DIAGNOSIS — E7800 Pure hypercholesterolemia, unspecified: Secondary | ICD-10-CM | POA: Diagnosis not present

## 2022-07-12 DIAGNOSIS — D6869 Other thrombophilia: Secondary | ICD-10-CM | POA: Diagnosis not present

## 2022-07-12 DIAGNOSIS — Z86711 Personal history of pulmonary embolism: Secondary | ICD-10-CM | POA: Diagnosis not present

## 2022-07-12 DIAGNOSIS — L4050 Arthropathic psoriasis, unspecified: Secondary | ICD-10-CM | POA: Diagnosis not present

## 2022-07-12 DIAGNOSIS — E039 Hypothyroidism, unspecified: Secondary | ICD-10-CM | POA: Diagnosis not present

## 2022-07-12 DIAGNOSIS — I10 Essential (primary) hypertension: Secondary | ICD-10-CM | POA: Diagnosis not present

## 2022-07-12 LAB — BASIC METABOLIC PANEL
BUN/Creatinine Ratio: 19 (ref 12–28)
BUN: 16 mg/dL (ref 8–27)
CO2: 26 mmol/L (ref 20–29)
Calcium: 10 mg/dL (ref 8.7–10.3)
Chloride: 98 mmol/L (ref 96–106)
Creatinine, Ser: 0.85 mg/dL (ref 0.57–1.00)
Glucose: 93 mg/dL (ref 70–99)
Potassium: 4.1 mmol/L (ref 3.5–5.2)
Sodium: 140 mmol/L (ref 134–144)
eGFR: 71 mL/min/{1.73_m2} (ref >59)

## 2022-07-14 DIAGNOSIS — M25572 Pain in left ankle and joints of left foot: Secondary | ICD-10-CM | POA: Diagnosis not present

## 2022-07-26 DIAGNOSIS — L718 Other rosacea: Secondary | ICD-10-CM | POA: Diagnosis not present

## 2022-07-26 DIAGNOSIS — Z961 Presence of intraocular lens: Secondary | ICD-10-CM | POA: Diagnosis not present

## 2022-07-26 DIAGNOSIS — H18522 Epithelial (juvenile) corneal dystrophy, left eye: Secondary | ICD-10-CM | POA: Diagnosis not present

## 2022-07-26 DIAGNOSIS — H0288A Meibomian gland dysfunction right eye, upper and lower eyelids: Secondary | ICD-10-CM | POA: Diagnosis not present

## 2022-07-26 DIAGNOSIS — Z9889 Other specified postprocedural states: Secondary | ICD-10-CM | POA: Diagnosis not present

## 2022-07-26 DIAGNOSIS — H0288B Meibomian gland dysfunction left eye, upper and lower eyelids: Secondary | ICD-10-CM | POA: Diagnosis not present

## 2022-07-26 DIAGNOSIS — H43811 Vitreous degeneration, right eye: Secondary | ICD-10-CM | POA: Diagnosis not present

## 2022-08-04 ENCOUNTER — Telehealth: Payer: Self-pay | Admitting: Nurse Practitioner

## 2022-08-04 NOTE — Telephone Encounter (Signed)
Paper Work Dropped Off: Blood pressure readings  Date: 08/04/2022   Location of paper: providers box

## 2022-08-07 NOTE — Telephone Encounter (Signed)
Gaston Islam., NP  Alden Server minutes ago (8:50 AM)    Please let patient know that blood pressures and heart rates appear to be stable.  Please advise her to continue HCTZ 12.5 mg daily with no changes needed at this time.  Robin Searing, NP

## 2022-08-07 NOTE — Telephone Encounter (Signed)
Patient has been notified directly; all questions, if any, were answered. Patient voiced understanding.    

## 2022-08-11 DIAGNOSIS — M25572 Pain in left ankle and joints of left foot: Secondary | ICD-10-CM | POA: Diagnosis not present

## 2022-08-14 DIAGNOSIS — M25672 Stiffness of left ankle, not elsewhere classified: Secondary | ICD-10-CM | POA: Diagnosis not present

## 2022-08-14 DIAGNOSIS — S8265XD Nondisplaced fracture of lateral malleolus of left fibula, subsequent encounter for closed fracture with routine healing: Secondary | ICD-10-CM | POA: Diagnosis not present

## 2022-08-14 DIAGNOSIS — R262 Difficulty in walking, not elsewhere classified: Secondary | ICD-10-CM | POA: Diagnosis not present

## 2022-08-14 DIAGNOSIS — S92355D Nondisplaced fracture of fifth metatarsal bone, left foot, subsequent encounter for fracture with routine healing: Secondary | ICD-10-CM | POA: Diagnosis not present

## 2022-08-23 DIAGNOSIS — S92355D Nondisplaced fracture of fifth metatarsal bone, left foot, subsequent encounter for fracture with routine healing: Secondary | ICD-10-CM | POA: Diagnosis not present

## 2022-08-23 DIAGNOSIS — M25672 Stiffness of left ankle, not elsewhere classified: Secondary | ICD-10-CM | POA: Diagnosis not present

## 2022-08-23 DIAGNOSIS — S8265XD Nondisplaced fracture of lateral malleolus of left fibula, subsequent encounter for closed fracture with routine healing: Secondary | ICD-10-CM | POA: Diagnosis not present

## 2022-08-23 DIAGNOSIS — R262 Difficulty in walking, not elsewhere classified: Secondary | ICD-10-CM | POA: Diagnosis not present

## 2022-08-25 DIAGNOSIS — M25672 Stiffness of left ankle, not elsewhere classified: Secondary | ICD-10-CM | POA: Diagnosis not present

## 2022-08-25 DIAGNOSIS — S92355D Nondisplaced fracture of fifth metatarsal bone, left foot, subsequent encounter for fracture with routine healing: Secondary | ICD-10-CM | POA: Diagnosis not present

## 2022-08-25 DIAGNOSIS — R262 Difficulty in walking, not elsewhere classified: Secondary | ICD-10-CM | POA: Diagnosis not present

## 2022-08-25 DIAGNOSIS — S8265XD Nondisplaced fracture of lateral malleolus of left fibula, subsequent encounter for closed fracture with routine healing: Secondary | ICD-10-CM | POA: Diagnosis not present

## 2022-08-28 DIAGNOSIS — S8265XD Nondisplaced fracture of lateral malleolus of left fibula, subsequent encounter for closed fracture with routine healing: Secondary | ICD-10-CM | POA: Diagnosis not present

## 2022-08-28 DIAGNOSIS — S92355D Nondisplaced fracture of fifth metatarsal bone, left foot, subsequent encounter for fracture with routine healing: Secondary | ICD-10-CM | POA: Diagnosis not present

## 2022-08-28 DIAGNOSIS — R262 Difficulty in walking, not elsewhere classified: Secondary | ICD-10-CM | POA: Diagnosis not present

## 2022-08-28 DIAGNOSIS — M25672 Stiffness of left ankle, not elsewhere classified: Secondary | ICD-10-CM | POA: Diagnosis not present

## 2022-08-30 DIAGNOSIS — S8265XD Nondisplaced fracture of lateral malleolus of left fibula, subsequent encounter for closed fracture with routine healing: Secondary | ICD-10-CM | POA: Diagnosis not present

## 2022-08-30 DIAGNOSIS — M25672 Stiffness of left ankle, not elsewhere classified: Secondary | ICD-10-CM | POA: Diagnosis not present

## 2022-08-30 DIAGNOSIS — S92355D Nondisplaced fracture of fifth metatarsal bone, left foot, subsequent encounter for fracture with routine healing: Secondary | ICD-10-CM | POA: Diagnosis not present

## 2022-08-30 DIAGNOSIS — R262 Difficulty in walking, not elsewhere classified: Secondary | ICD-10-CM | POA: Diagnosis not present

## 2022-09-04 DIAGNOSIS — R262 Difficulty in walking, not elsewhere classified: Secondary | ICD-10-CM | POA: Diagnosis not present

## 2022-09-04 DIAGNOSIS — S8265XD Nondisplaced fracture of lateral malleolus of left fibula, subsequent encounter for closed fracture with routine healing: Secondary | ICD-10-CM | POA: Diagnosis not present

## 2022-09-04 DIAGNOSIS — S92355D Nondisplaced fracture of fifth metatarsal bone, left foot, subsequent encounter for fracture with routine healing: Secondary | ICD-10-CM | POA: Diagnosis not present

## 2022-09-04 DIAGNOSIS — M25672 Stiffness of left ankle, not elsewhere classified: Secondary | ICD-10-CM | POA: Diagnosis not present

## 2022-09-05 DIAGNOSIS — H9312 Tinnitus, left ear: Secondary | ICD-10-CM | POA: Diagnosis not present

## 2022-09-05 DIAGNOSIS — H903 Sensorineural hearing loss, bilateral: Secondary | ICD-10-CM | POA: Diagnosis not present

## 2022-09-05 DIAGNOSIS — Z822 Family history of deafness and hearing loss: Secondary | ICD-10-CM | POA: Diagnosis not present

## 2022-09-05 DIAGNOSIS — E039 Hypothyroidism, unspecified: Secondary | ICD-10-CM | POA: Diagnosis not present

## 2022-09-06 DIAGNOSIS — M25672 Stiffness of left ankle, not elsewhere classified: Secondary | ICD-10-CM | POA: Diagnosis not present

## 2022-09-06 DIAGNOSIS — S92355D Nondisplaced fracture of fifth metatarsal bone, left foot, subsequent encounter for fracture with routine healing: Secondary | ICD-10-CM | POA: Diagnosis not present

## 2022-09-06 DIAGNOSIS — S8265XD Nondisplaced fracture of lateral malleolus of left fibula, subsequent encounter for closed fracture with routine healing: Secondary | ICD-10-CM | POA: Diagnosis not present

## 2022-09-06 DIAGNOSIS — R262 Difficulty in walking, not elsewhere classified: Secondary | ICD-10-CM | POA: Diagnosis not present

## 2022-09-09 ENCOUNTER — Other Ambulatory Visit: Payer: Self-pay | Admitting: Nurse Practitioner

## 2022-09-11 DIAGNOSIS — S92355D Nondisplaced fracture of fifth metatarsal bone, left foot, subsequent encounter for fracture with routine healing: Secondary | ICD-10-CM | POA: Diagnosis not present

## 2022-09-11 DIAGNOSIS — S8265XD Nondisplaced fracture of lateral malleolus of left fibula, subsequent encounter for closed fracture with routine healing: Secondary | ICD-10-CM | POA: Diagnosis not present

## 2022-09-11 DIAGNOSIS — R262 Difficulty in walking, not elsewhere classified: Secondary | ICD-10-CM | POA: Diagnosis not present

## 2022-09-11 DIAGNOSIS — M25672 Stiffness of left ankle, not elsewhere classified: Secondary | ICD-10-CM | POA: Diagnosis not present

## 2022-09-13 DIAGNOSIS — M2041 Other hammer toe(s) (acquired), right foot: Secondary | ICD-10-CM | POA: Diagnosis not present

## 2022-09-13 DIAGNOSIS — M2042 Other hammer toe(s) (acquired), left foot: Secondary | ICD-10-CM | POA: Diagnosis not present

## 2022-09-13 DIAGNOSIS — M25672 Stiffness of left ankle, not elsewhere classified: Secondary | ICD-10-CM | POA: Diagnosis not present

## 2022-10-03 ENCOUNTER — Encounter: Payer: Self-pay | Admitting: Internal Medicine

## 2022-10-03 ENCOUNTER — Ambulatory Visit: Payer: Medicare HMO | Attending: Internal Medicine | Admitting: Internal Medicine

## 2022-10-03 VITALS — BP 114/62 | HR 70 | Ht 64.0 in | Wt 131.0 lb

## 2022-10-03 DIAGNOSIS — I951 Orthostatic hypotension: Secondary | ICD-10-CM | POA: Diagnosis not present

## 2022-10-03 DIAGNOSIS — I77819 Aortic ectasia, unspecified site: Secondary | ICD-10-CM | POA: Diagnosis not present

## 2022-10-03 DIAGNOSIS — I10 Essential (primary) hypertension: Secondary | ICD-10-CM

## 2022-10-03 DIAGNOSIS — E782 Mixed hyperlipidemia: Secondary | ICD-10-CM | POA: Diagnosis not present

## 2022-10-03 DIAGNOSIS — I493 Ventricular premature depolarization: Secondary | ICD-10-CM | POA: Diagnosis not present

## 2022-10-03 DIAGNOSIS — I351 Nonrheumatic aortic (valve) insufficiency: Secondary | ICD-10-CM | POA: Diagnosis not present

## 2022-10-03 DIAGNOSIS — Z8241 Family history of sudden cardiac death: Secondary | ICD-10-CM

## 2022-10-03 NOTE — Patient Instructions (Signed)
Medication Instructions:  Your physician recommends that you continue on your current medications as directed. Please refer to the Current Medication list given to you today.  *If you need a refill on your cardiac medications before your next appointment, please call your pharmacy*   Lab Work: NONE If you have labs (blood work) drawn today and your tests are completely normal, you will receive your results only by: MyChart Message (if you have MyChart) OR A paper copy in the mail If you have any lab test that is abnormal or we need to change your treatment, we will call you to review the results.   Testing/Procedures: NOV-- Your physician has requested that you have an echocardiogram. Echocardiography is a painless test that uses sound waves to create images of your heart. It provides your doctor with information about the size and shape of your heart and how well your heart's chambers and valves are working. This procedure takes approximately one hour. There are no restrictions for this procedure. Please do NOT wear cologne, perfume, aftershave, or lotions (deodorant is allowed). Please arrive 15 minutes prior to your appointment time.    Follow-Up: At [facility], you and your health needs are our priority.  As part of our continuing mission to provide you with exceptional heart care, we have created designated Provider Care Teams.  These Care Teams include your primary Cardiologist (physician) and Advanced Practice Providers (APPs -  Physician Assistants and Nurse Practitioners) who all work together to provide you with the care you need, when you need it.    Your next appointment:   9 month(s)  Provider:   Christell Constant, MD

## 2022-10-03 NOTE — Progress Notes (Signed)
Cardiology Office Note:    Date:  10/03/2022   ID:  MAURISSA SICHER, DOB October 28, 1945, MRN [id number]  PCP:  Moshe Cipro, NP   [facility] Providers Cardiologist:  Christell Constant, MD     Referring MD: Moshe Cipro, NP   CC: Orthostatic hypotension  History of Present Illness:    Ashley Griffith is a 77 y.o. female with a hx of prior PE and DVT for Xarelto, Rare PVCs, and DOE who presents for evaluation. 2023: Able to go to the gym and walk with no symptoms. Lost 20 lbs. Had three falls. 2024: Feeling   Patient notes that she is doing better Since last visit notes having one additional fall 06/26/2022 . She had tried compression stockings and fell when putting the other on one.  There are no interval hospital/ED visit.   Is not yet back to the gym.   Just out of the boot.   No chest pain or pressure.  No SOB/DOE and no PND/Orthopnea.  No weight gain or leg swelling.  No palpitations or syncope .   Past Medical History:  Diagnosis Date   Anxiety    Arthritis    Arthropathic    Blepharitis, right eye    Blood clot in vein    leg [id number]   Depression    Depression    Dry eye syndrome    DVT (deep venous thrombosis) (HCC) 1996   GAD (generalized anxiety disorder)    GERD (gastroesophageal reflux disease)    Graves disease    Hemorrhoids    Patient notes intermittent rectal bleeding from these   High cholesterol    Hypothyroidism    Seen by Dr. Juleen China   Insomnia    Migraine    Obesity    Status post significant intentional weight loss in 2003 of > 90 pounds   Osteoarthritis    Osteopenia    Peripheral neuropathy    Personal history of pulmonary embolism    PONV (postoperative nausea and vomiting)    Psoriatic arthritis (HCC)    Follows with Dr. Clarita Crane   Pulmonary embolism [facility]) 06/1993   Rosacea    Rosacea, unspecified    Sleep disturbance    Vitamin D deficiency     Past Surgical History:  Procedure Laterality  Date   blood mass removed from abdomen  1972   ? questionable ectopic pregnancy   cataract surgery  2006 and 2007   childbirth  1976   colonscopy     x 2   KNEE ARTHROSCOPY Right 09/18/2012   Procedure: RIGHT ARTHROSCOPY KNEE, Tlateral meniscal debdridement and chondroplasty;  Surgeon: Loanne Drilling, MD;  Location: [facility];  Service: Orthopedics;  Laterality: Right;   LUMBAR LAMINECTOMY/DECOMPRESSION MICRODISCECTOMY Right 09/16/2015   Procedure: Right Lumbar Four-FiveLaminectomy with resection of synovial cyst;  Surgeon: Maeola Harman, MD;  Location: [facility];  Service: Neurosurgery;  Laterality: Right;  right   nasal poly removed  1964   OPEN REDUCTION INTERNAL FIXATION (ORIF) METACARPAL Right 08/21/2018   Procedure: OPEN REDUCTION INTERNAL FIXATION (ORIF) AND REPAIR AS INDICATED  RIGHT 5TH METACARPAL;  Surgeon: Bradly Bienenstock, MD;  Location: [facility];  Service: Orthopedics;  Laterality: Right;    sclorotomy both legs veins  2005 or 2006   SPHINCTEROTOMY  1990's   superficial keratotomy  2021   TONSILLECTOMY  as child   torn meniscus right knee  2014   TRANSFORAMINAL LUMBAR INTERBODY FUSION (TLIF) WITH PEDICLE SCREW  FIXATION 1 LEVEL Left 05/02/2018   Procedure: Left Lumbar four-five Transforaminal lumbar interbody fusion;  Surgeon: Maeola Harman, MD;  Location: [facility] OR;  Service: Neurosurgery;  Laterality: Left;  Left Lumbar four-five Transforaminal lumbar interbody fusion    Current Medications: Current Meds  Medication Sig   acetaminophen (TYLENOL) 650 MG CR tablet Take 650 mg by mouth in the morning and at bedtime.   atorvastatin (LIPITOR) 20 MG tablet Take 20 mg by mouth daily.   B Complex-C (B-COMPLEX WITH VITAMIN C) tablet Take 1 tablet by mouth daily.   Biotin 1000 MCG tablet Take 10,000 mcg by mouth daily.   buPROPion (WELLBUTRIN XL) 300 MG 24 hr tablet Take 300 mg by mouth daily.   Calcium Citrate-Vitamin D (CALCIUM CITRATE + D PO) Take 2 tablets by mouth  in the morning.    celecoxib (CELEBREX) 100 MG capsule Take 100 mg by mouth 2 (two) times daily.    diazepam (VALIUM) 2 MG tablet Take 2 mg by mouth as needed for anxiety.   folic acid (FOLVITE) 800 MCG tablet Take 2,400 mcg by mouth every morning.   gabapentin (NEURONTIN) 300 MG capsule Take 300 mg by mouth at bedtime.   hydrochlorothiazide (MICROZIDE) 12.5 MG capsule TAKE 1 CAPSULE BY MOUTH EVERY DAY   metroNIDAZOLE (METROGEL) 1 % gel Apply 1 application topically 2 (two) times daily. To face   Multiple Vitamin (MULTIVITAMIN) tablet Take 1 tablet by mouth daily. SUPER B COMPLEX SUPPLEMENT   Omega-3 Fatty Acids (OMEGA-3 FISH OIL PO) Take 1,400 mg by mouth daily.    omeprazole (PRILOSEC) 20 MG capsule Take 20 mg by mouth daily.   rivaroxaban (XARELTO) 20 MG TABS tablet Take 1 tablet (20 mg total) by mouth daily with supper.   SYNTHROID 88 MCG tablet Take 88 mcg by mouth daily.   TREMFYA 100 MG/ML SOSY Inject into the skin.   Vitamin D, Ergocalciferol, (DRISDOL) 1.25 MG (50000 UT) CAPS capsule Take 50,000 Units by mouth every 7 (seven) days.     Allergies:   Erythromycin, Penicillins, Thimerosal, Adhesive [tape], Buspirone hcl, Kenalog [triamcinolone acetonide], Monistat [miconazole], Secukinumab, Silicone, and Thimerosal (thiomersal)   Social History   Socioeconomic History   Marital status: Married    Spouse name: Not on file   Number of children: 1   Years of education: Not on file   Highest education level: Master's degree (e.g., MA, MS, MEng, MEd, MSW, MBA)  Occupational History   Not on file  Tobacco Use   Smoking status: Former    Packs/day: 1.00    Years: 5.00    Additional pack years: 0.00    Total pack years: 5.00    Types: Cigarettes    Quit date: 04/24/1973    Years since quitting: 49.4   Smokeless tobacco: Never  Vaping Use   Vaping Use: Never used  Substance and Sexual Activity   Alcohol use: Yes    Alcohol/week: 1.0 standard drink of alcohol    Types: 1 Standard  drinks or equivalent per week    Comment: social   Drug use: No   Sexual activity: Not on file  Other Topics Concern   Not on file  Social History Narrative   Lives at home with husband and daughter   Caffeine: about 10 oz coffee/day    Social Determinants of Health   Financial Resource Strain: Not on file  Food Insecurity: Not on file  Transportation Needs: Not on file  Physical Activity: Not on file  Stress:  Not on file  Social Connections: Not on file     Family History: The patient's family history includes Acute myelogenous leukemia in her brother; Arthritis in an other family member; Cancer in her maternal aunt; Depression in her father; Gallstones in her father; Heart Problems in her mother; Heart attack in her father; Kidney failure in her mother; Obesity in an other family member; Ulcerative colitis in her mother; Varicose Veins in her mother.  ROS:   Please see the history of present illness.     EKGs/Labs/Other Studies Reviewed:    The following studies were reviewed today:  EKG:   02/10/22: SR with rare PACs and LAE  Cardiac Studies & Procedures       ECHOCARDIOGRAM  ECHOCARDIOGRAM COMPLETE 02/24/2022  Narrative ECHOCARDIOGRAM REPORT    Patient Name:   BAREERAH AMRHEIN Pulver Date of Exam: 02/24/2022 Medical Rec #:  [id number]            Height:       64.0 in Accession #:    [id number]           Weight:       165.4 lb Date of Birth:  Apr 13, 1946            BSA:          1.805 m Patient Age:    76 years             BP:           125/86 mmHg Patient Gender: F                    HR:           70 bpm. Exam Location:  [address]  Procedure: 2D Echo, Cardiac Doppler and Color Doppler  Indications:    R06.09 Dyspnea on Exertion  History:        Patient has no prior history of Echocardiogram examinations. Risk Factors:Former Smoker. Pulmonary embolism.  Sonographer:    Daphine Deutscher RDCS Referring Phys: [id number] [facility] A  Michaelann Gunnoe  IMPRESSIONS   1. Left ventricular ejection fraction, by estimation, is 60 to 65%. The left ventricle has normal function. The left ventricle has no regional wall motion abnormalities. Left ventricular diastolic parameters are consistent with Grade I diastolic dysfunction (impaired relaxation). 2. Right ventricular systolic function is normal. The right ventricular size is normal. Tricuspid regurgitation signal is inadequate for assessing PA pressure. 3. The mitral valve is degenerative. No evidence of mitral valve regurgitation. 4. Aortic valve regurgitation is mild to moderate. Aortic valve sclerosis is present, with no evidence of aortic valve stenosis. 5. Aneurysm of the ascending aorta, measuring 41 mm. 6. The inferior vena cava is normal in size with greater than 50% respiratory variability, suggesting right atrial pressure of 3 mmHg.  Comparison(s): No prior Echocardiogram.  FINDINGS Left Ventricle: Left ventricular ejection fraction, by estimation, is 60 to 65%. The left ventricle has normal function. The left ventricle has no regional wall motion abnormalities. The left ventricular internal cavity size was normal in size. There is no left ventricular hypertrophy. Left ventricular diastolic parameters are consistent with Grade I diastolic dysfunction (impaired relaxation).  Right Ventricle: The right ventricular size is normal. Right ventricular systolic function is normal. Tricuspid regurgitation signal is inadequate for assessing PA pressure.  Left Atrium: Left atrial size was normal in size.  Right Atrium: Right atrial size was normal in size.  Pericardium: There is no evidence of pericardial effusion.  Mitral Valve:  The mitral valve is degenerative in appearance. No evidence of mitral valve regurgitation.  Tricuspid Valve: The tricuspid valve is grossly normal. Tricuspid valve regurgitation is not demonstrated.  Aortic Valve: Aortic valve regurgitation is mild  to moderate. Aortic regurgitation PHT measures 560 msec. Aortic valve sclerosis is present, with no evidence of aortic valve stenosis.  Pulmonic Valve: Pulmonic valve regurgitation is mild to moderate.  Aorta: There is an aneurysm involving the ascending aorta measuring 41 mm.  Venous: The inferior vena cava is normal in size with greater than 50% respiratory variability, suggesting right atrial pressure of 3 mmHg.  IAS/Shunts: No atrial level shunt detected by color flow Doppler.   LEFT VENTRICLE PLAX 2D LVIDd:         4.00 cm   Diastology LVIDs:         2.80 cm   LV e' medial:    5.77 cm/s LV PW:         0.80 cm   LV E/e' medial:  5.8 LV IVS:        0.90 cm   LV e' lateral:   7.94 cm/s LVOT diam:     1.90 cm   LV E/e' lateral: 4.2 LV SV:         68 LV SV Index:   38 LVOT Area:     2.84 cm   RIGHT VENTRICLE             IVC RV Basal diam:  3.40 cm     IVC diam: 1.70 cm RV S prime:     11.55 cm/s TAPSE (M-mode): 2.7 cm  LEFT ATRIUM             Index        RIGHT ATRIUM           Index LA diam:        3.60 cm 1.99 cm/m   RA Area:     12.40 cm LA Vol (A2C):   50.1 ml 27.76 ml/m  RA Volume:   30.30 ml  16.79 ml/m LA Vol (A4C):   30.4 ml 16.85 ml/m LA Biplane Vol: 41.2 ml 22.83 ml/m AORTIC VALVE LVOT Vmax:   119.00 cm/s LVOT Vmean:  76.800 cm/s LVOT VTI:    0.240 m AI PHT:      560 msec  AORTA Ao Root diam: 3.20 cm Ao Asc diam:  4.10 cm  MITRAL VALVE MV Area (PHT): 2.99 cm    SHUNTS MV Decel Time: 254 msec    Systemic VTI:  0.24 m MV E velocity: 33.50 cm/s  Systemic Diam: 1.90 cm MV A velocity: 64.70 cm/s MV E/A ratio:  0.52  Photographer signed by Carolan Clines Signature Date/Time: 02/24/2022/4:35:58 PM    Final    MONITORS  LONG TERM MONITOR (3-14 DAYS) 03/23/2022  Narrative   Patient had a minimum heart rate of 49 bpm, maximum heart rate of 179 bpm, and average heart rate of 77 bpm.   Predominant underlying rhythm was sinus rhythm.    Paroxysms of regular, supraventricular tachycardia.   Longest episode 32 seconds.  Symptoms were not triggered on device during this.   Isolated PACs were rare (<1.0%).   Isolated PVCs were occasional (2%).   Triggered and diary events associated with sinus rhythm, sinus tachycardia, PACs, and PVCs.  Paroxsymal SVT without device trigger for symptoms.            Recent Labs: 07/11/2022: BUN 16; Creatinine, Ser 0.85; Potassium 4.1;  Sodium 140  Recent Lipid Panel No results found for: "CHOL", "TRIG", "HDL", "CHOLHDL", "VLDL", "LDLCALC", "LDLDIRECT"       Physical Exam:    VS:  BP 114/62   Pulse 70   Ht 5\' 4"  (1.626 m)   Wt 131 lb (59.4 kg)   SpO2 95%   BMI 22.49 kg/m     Wt Readings from Last 3 Encounters:  10/03/22 131 lb (59.4 kg)  07/11/22 130 lb (59 kg)  06/27/22 128 lb (58.1 kg)    GEN:  Well nourished, well developed in no acute distress HEENT: Normal NECK: No JVD CARDIAC: RRR, no rubs, gallops, diastolic murmur RESPIRATORY:  Clear to auscultation without rales, wheezing or rhonchi  ABDOMEN: Soft, non-tender, non-distended MUSCULOSKELETAL:  No edema; No deformity  SKIN: Warm and dry NEUROLOGIC:  Alert and oriented x 3 PSYCHIATRIC:  Normal affect   ASSESSMENT:    1. Aortic dilatation (HCC)   2. Aortic valve insufficiency, etiology of cardiac valve disease unspecified   3. Family history of sudden cardiac death   4. Orthostatic hypotension   5. PVC (premature ventricular contraction)   6. Essential hypertension   7. Mixed hyperlipidemia     PLAN:    Moderate aortic regurgitation Family history of sudden cardiac death (father) Mild aortic dilation - Asymptomatic; LVEF normal with normal size; will repeat echo in 02/2023  Orthostatic Hypotension HTN - improved on low BP regimen - doing well on HCTZ  Rare SVT PVCs - asymptomatic; no changes; high bar to add AV nodal agents given BP concerns  HLD - control  on Lipitor.   3/25 or 4/25 f/u with  me       Medication Adjustments/Labs and Tests Ordered: Current medicines are reviewed at length with the patient today.  Concerns regarding medicines are outlined above.  Orders Placed This Encounter  Procedures   ECHOCARDIOGRAM COMPLETE   No orders of the defined types were placed in this encounter.   Patient Instructions  Medication Instructions:  Your physician recommends that you continue on your current medications as directed. Please refer to the Current Medication list given to you today.  *If you need a refill on your cardiac medications before your next appointment, please call your pharmacy*   Lab Work: NONE If you have labs (blood work) drawn today and your tests are completely normal, you will receive your results only by: MyChart Message (if you have MyChart) OR A paper copy in the mail If you have any lab test that is abnormal or we need to change your treatment, we will call you to review the results.   Testing/Procedures: NOV-- Your physician has requested that you have an echocardiogram. Echocardiography is a painless test that uses sound waves to create images of your heart. It provides your doctor with information about the size and shape of your heart and how well your heart's chambers and valves are working. This procedure takes approximately one hour. There are no restrictions for this procedure. Please do NOT wear cologne, perfume, aftershave, or lotions (deodorant is allowed). Please arrive 15 minutes prior to your appointment time.    Follow-Up: At [facility], you and your health needs are our priority.  As part of our continuing mission to provide you with exceptional heart care, we have created designated Provider Care Teams.  These Care Teams include your primary Cardiologist (physician) and Advanced Practice Providers (APPs -  Physician Assistants and Nurse Practitioners) who all work together to provide you with  the care you need, when  you need it.    Your next appointment:   9 month(s)  Provider:   Christell Constant, MD        Signed, Christell Constant, MD  10/03/2022 4:26 PM    [facility]

## 2022-10-11 NOTE — Telephone Encounter (Signed)
Received another clearance for procedure below. Pt is just now calling back to schedule.  Surgeon's office just wants to make sure pt still cleared.    Will send to preop pool to address.

## 2022-10-11 NOTE — Telephone Encounter (Signed)
   Patient Name: Ashley Griffith  DOB: 09/29/1945 MRN: [id number]  Primary Cardiologist: Christell Constant, MD  Chart reviewed as part of pre-operative protocol coverage.  Patient was previously cleared for procedure.  I have faxed this recommendation to the requesting party.  I will route this recommendation to the requesting party via Epic fax function and remove from pre-op pool.  Please call with questions.  Joylene Grapes, NP 10/11/2022, 11:36 AM

## 2022-10-25 DIAGNOSIS — L821 Other seborrheic keratosis: Secondary | ICD-10-CM | POA: Diagnosis not present

## 2022-10-25 DIAGNOSIS — L720 Epidermal cyst: Secondary | ICD-10-CM | POA: Diagnosis not present

## 2022-10-25 DIAGNOSIS — L308 Other specified dermatitis: Secondary | ICD-10-CM | POA: Diagnosis not present

## 2022-10-25 DIAGNOSIS — L218 Other seborrheic dermatitis: Secondary | ICD-10-CM | POA: Diagnosis not present

## 2022-10-25 DIAGNOSIS — L718 Other rosacea: Secondary | ICD-10-CM | POA: Diagnosis not present

## 2022-10-25 DIAGNOSIS — D3613 Benign neoplasm of peripheral nerves and autonomic nervous system of lower limb, including hip: Secondary | ICD-10-CM | POA: Diagnosis not present

## 2022-10-30 DIAGNOSIS — K4190 Unilateral femoral hernia, without obstruction or gangrene, not specified as recurrent: Secondary | ICD-10-CM | POA: Diagnosis not present

## 2022-10-30 DIAGNOSIS — F411 Generalized anxiety disorder: Secondary | ICD-10-CM | POA: Diagnosis not present

## 2022-10-30 DIAGNOSIS — Z7901 Long term (current) use of anticoagulants: Secondary | ICD-10-CM | POA: Diagnosis not present

## 2022-10-30 DIAGNOSIS — Z6823 Body mass index (BMI) 23.0-23.9, adult: Secondary | ICD-10-CM | POA: Diagnosis not present

## 2022-10-30 DIAGNOSIS — Z79899 Other long term (current) drug therapy: Secondary | ICD-10-CM | POA: Diagnosis not present

## 2022-10-30 DIAGNOSIS — L4050 Arthropathic psoriasis, unspecified: Secondary | ICD-10-CM | POA: Diagnosis not present

## 2022-10-30 DIAGNOSIS — I10 Essential (primary) hypertension: Secondary | ICD-10-CM | POA: Diagnosis not present

## 2022-10-30 DIAGNOSIS — E039 Hypothyroidism, unspecified: Secondary | ICD-10-CM | POA: Diagnosis not present

## 2022-10-30 DIAGNOSIS — Z86711 Personal history of pulmonary embolism: Secondary | ICD-10-CM | POA: Diagnosis not present

## 2022-11-16 DIAGNOSIS — L218 Other seborrheic dermatitis: Secondary | ICD-10-CM | POA: Diagnosis not present

## 2022-11-20 DIAGNOSIS — E559 Vitamin D deficiency, unspecified: Secondary | ICD-10-CM | POA: Diagnosis not present

## 2022-11-20 DIAGNOSIS — M542 Cervicalgia: Secondary | ICD-10-CM | POA: Diagnosis not present

## 2022-11-20 DIAGNOSIS — Z6822 Body mass index (BMI) 22.0-22.9, adult: Secondary | ICD-10-CM | POA: Diagnosis not present

## 2022-11-20 DIAGNOSIS — Z79899 Other long term (current) drug therapy: Secondary | ICD-10-CM | POA: Diagnosis not present

## 2022-11-20 DIAGNOSIS — L4050 Arthropathic psoriasis, unspecified: Secondary | ICD-10-CM | POA: Diagnosis not present

## 2022-11-20 DIAGNOSIS — M25531 Pain in right wrist: Secondary | ICD-10-CM | POA: Diagnosis not present

## 2022-11-20 DIAGNOSIS — L408 Other psoriasis: Secondary | ICD-10-CM | POA: Diagnosis not present

## 2022-11-20 DIAGNOSIS — M8000XD Age-related osteoporosis with current pathological fracture, unspecified site, subsequent encounter for fracture with routine healing: Secondary | ICD-10-CM | POA: Diagnosis not present

## 2022-11-20 DIAGNOSIS — M1710 Unilateral primary osteoarthritis, unspecified knee: Secondary | ICD-10-CM | POA: Diagnosis not present

## 2022-11-21 ENCOUNTER — Ambulatory Visit: Payer: Self-pay | Admitting: General Surgery

## 2022-11-21 NOTE — Progress Notes (Signed)
Surgical Instructions    Your procedure is scheduled on Friday August 5th.  Report to [facility] Main Entrance "A" at 1:30 P.M., then check in with the Admitting office.  Call this number if you have problems the morning of surgery:  [phone]   If you have any questions prior to your surgery date call [phone]: Open Monday-Friday 8am-4pm If you experience any cold or flu symptoms such as cough, fever, chills, shortness of breath, etc. between now and your scheduled surgery, please notify us at the above number     Remember:  Do not eat after midnight the night before your surgery  You may drink clear liquids until 12:30pm the afternoon of your surgery.   Clear liquids allowed are: Water, Non-Citrus Juices (without pulp), Carbonated Beverages, Clear Tea, Black Coffee ONLY (NO MILK, CREAM OR POWDERED CREAMER of any kind), and Gatorade    Take these medicines the morning of surgery with A SIP OF WATER: acetaminophen (TYLENOL) 650 MG CR tablet  atorvastatin (LIPITOR) 20 MG tablet  buPROPion (WELLBUTRIN XL) 300 MG 24 hr tablet  celecoxib (CELEBREX) 100 MG capsule  omeprazole (PRILOSEC) 20 MG capsule  SYNTHROID 88 MCG tablet    IF NEEDED  diazepam (VALIUM) 2 MG tablet    Follow your surgeon's instructions on when to stop Xarelto.  If no instructions were given by your surgeon then you will need to call the office to get those instructions.     As of today, STOP taking any Aspirin (unless otherwise instructed by your surgeon) Aleve, Naproxen, Ibuprofen, Motrin, Advil, Goody's, BC's, all herbal medications, fish oil, and all vitamins.           Do not wear jewelry or makeup. Do not wear lotions, powders, perfumes or deodorant. Do not shave 48 hours prior to surgery.   Do not bring valuables to the hospital. Do not wear nail polish, gel polish, artificial nails, or any other type of covering on natural nails (fingers and toes) If you have artificial nails or gel coating that  need to be removed by a nail salon, please have this removed prior to surgery. Artificial nails or gel coating may interfere with anesthesia's ability to adequately monitor your vital signs.  [facility] is not responsible for any belongings or valuables.    Do NOT Smoke (Tobacco/Vaping)  24 hours prior to your procedure  If you use a CPAP at night, you may bring your mask for your overnight stay.   Contacts, glasses, hearing aids, dentures or partials may not be worn into surgery, please bring cases for these belongings   For patients admitted to the hospital, discharge time will be determined by your treatment team.   Patients discharged the day of surgery will not be allowed to drive home, and someone needs to stay with them for 24 hours.   SURGICAL WAITING ROOM VISITATION Patients having surgery or a procedure may have no more than 2 support people in the waiting area - these visitors may rotate.   Children under the age of 14 must have an adult with them who is not the patient. If the patient needs to stay at the hospital during part of their recovery, the visitor guidelines for inpatient rooms apply. Pre-op nurse will coordinate an appropriate time for 1 support person to accompany patient in pre-op.  This support person may not rotate.   Please refer to [email or web address] for the visitor guidelines for Inpatients (after your surgery is over and you are in  a regular room).    Special instructions:    Oral Hygiene is also important to reduce your risk of infection.  Remember - BRUSH YOUR TEETH THE MORNING OF SURGERY WITH YOUR REGULAR TOOTHPASTE   - Preparing For Surgery  Before surgery, you can play an important role. Because skin is not sterile, your skin needs to be as free of germs as possible. You can reduce the number of germs on your skin by washing with CHG (chlorahexidine gluconate) Soap before surgery.  CHG is  an antiseptic cleaner which kills germs and bonds with the skin to continue killing germs even after washing.     Please do not use if you have an allergy to CHG or antibacterial soaps. If your skin becomes reddened/irritated stop using the CHG.  Do not shave (including legs and underarms) for at least 48 hours prior to first CHG shower. It is OK to shave your face.  Please follow these instructions carefully.     Shower the NIGHT BEFORE SURGERY and the MORNING OF SURGERY with CHG Soap.   If you chose to wash your hair, wash your hair first as usual with your normal shampoo. After you shampoo, rinse your hair and body thoroughly to remove the shampoo.  Then [employer] and genitals (private parts) with your normal soap and rinse thoroughly to remove soap.  After that Use CHG Soap as you would any other liquid soap. You can apply CHG directly to the skin and wash gently with a scrungie or a clean washcloth.   Apply the CHG Soap to your body ONLY FROM THE NECK DOWN.  Do not use on open wounds or open sores. Avoid contact with your eyes, ears, mouth and genitals (private parts). Wash Face and genitals (private parts)  with your normal soap.   Wash thoroughly, paying special attention to the area where your surgery will be performed.  Thoroughly rinse your body with warm water from the neck down.  DO NOT shower/wash with your normal soap after using and rinsing off the CHG Soap.  Pat yourself dry with a CLEAN TOWEL.  Wear CLEAN PAJAMAS to bed the night before surgery  Place CLEAN SHEETS on your bed the night before your surgery  DO NOT SLEEP WITH PETS.   Day of Surgery:  Take a shower with CHG soap. Wear Clean/Comfortable clothing the morning of surgery Do not apply any deodorants/lotions.   Remember to brush your teeth WITH YOUR REGULAR TOOTHPASTE.    If you received a COVID test during your pre-op visit, it is requested that you wear a mask when out in public, stay away from  anyone that may not be feeling well, and notify your surgeon if you develop symptoms. If you have been in contact with anyone that has tested positive in the last 10 days, please notify your surgeon.    Please read over the following fact sheets that you were given.

## 2022-11-22 ENCOUNTER — Encounter (HOSPITAL_COMMUNITY)
Admission: RE | Admit: 2022-11-22 | Discharge: 2022-11-22 | Disposition: A | Discharge status: Home / Self Care | Payer: Medicare HMO | Source: Ambulatory Visit | Attending: General Surgery | Admitting: General Surgery

## 2022-11-22 ENCOUNTER — Encounter (HOSPITAL_COMMUNITY): Payer: Self-pay

## 2022-11-22 VITALS — BP 123/79 | HR 90 | Temp 98.2°F | Resp 16 | Ht 64.5 in | Wt 132.6 lb

## 2022-11-22 DIAGNOSIS — Z01812 Encounter for preprocedural laboratory examination: Secondary | ICD-10-CM | POA: Insufficient documentation

## 2022-11-22 DIAGNOSIS — L4050 Arthropathic psoriasis, unspecified: Secondary | ICD-10-CM | POA: Insufficient documentation

## 2022-11-22 DIAGNOSIS — Z86718 Personal history of other venous thrombosis and embolism: Secondary | ICD-10-CM | POA: Insufficient documentation

## 2022-11-22 DIAGNOSIS — Z7901 Long term (current) use of anticoagulants: Secondary | ICD-10-CM | POA: Insufficient documentation

## 2022-11-22 DIAGNOSIS — Z86711 Personal history of pulmonary embolism: Secondary | ICD-10-CM | POA: Insufficient documentation

## 2022-11-22 DIAGNOSIS — Z01818 Encounter for other preprocedural examination: Secondary | ICD-10-CM

## 2022-11-22 DIAGNOSIS — I951 Orthostatic hypotension: Secondary | ICD-10-CM | POA: Insufficient documentation

## 2022-11-22 HISTORY — DX: Essential (primary) hypertension: I10

## 2022-11-22 LAB — CBC
HCT: 38.6 % (ref 36.0–46.0)
Hemoglobin: 12.6 g/dL (ref 12.0–15.0)
MCH: 31.1 pg (ref 26.0–34.0)
MCHC: 32.6 g/dL (ref 30.0–36.0)
MCV: 95.3 fL (ref 80.0–100.0)
Platelets: 247 10*3/uL (ref 150–400)
RBC: 4.05 MIL/uL (ref 3.87–5.11)
RDW: 12.8 % (ref 11.5–15.5)
WBC: 5.8 10*3/uL (ref 4.0–10.5)
nRBC: 0 % (ref 0.0–0.2)

## 2022-11-22 NOTE — Progress Notes (Addendum)
Surgical Instructions                 Your procedure is scheduled on Friday August 5th.             Report to [facility] Main Entrance "A" at 1:30 P.M., then check in with the Admitting office.             Call this number if you have problems the morning of surgery:             [phone]    If you have any questions prior to your surgery date call [phone]: Open Monday-Friday 8am-4pm If you experience any cold or flu symptoms such as cough, fever, chills, shortness of breath, etc. between now and your scheduled surgery, please notify us at the above number                  Remember:             Do not eat after midnight the night before your surgery   You may drink clear liquids until 12:30pm the afternoon of your surgery.   Clear liquids allowed are: Water, Non-Citrus Juices (without pulp), Carbonated Beverages, Clear Tea, Black Coffee ONLY (NO MILK, CREAM OR POWDERED CREAMER of any kind), and Gatorade   Patient Instructions  The night before surgery:  No food after midnight. ONLY clear liquids after midnight  The day of surgery (if you do NOT have diabetes):  Drink ONE (1) Pre-Surgery Clear Ensure by 12:30 pm the morning of surgery. Drink in one sitting. Do not sip.  This drink was given to you during your hospital  pre-op appointment visit.  Nothing else to drink after completing the  Pre-Surgery Clear Ensure.         If you have questions, please contact your surgeon's office.                           Take these medicines the morning of surgery with A SIP OF WATER: acetaminophen (TYLENOL) 650 MG CR tablet  atorvastatin (LIPITOR) 20 MG tablet  buPROPion (WELLBUTRIN XL) 300 MG 24 hr tablet  omeprazole (PRILOSEC) 20 MG capsule  SYNTHROID 88 MCG tablet      IF NEEDED  diazepam (VALIUM) 2 MG tablet      Follow your surgeon's instructions on when to stop Xarelto.  If no instructions were given by your surgeon then you will need to call the office to get those  instructions.       As of today, STOP taking any Aspirin (unless otherwise instructed by your surgeon) Aleve, Naproxen, Ibuprofen, Motrin, Advil, Goody's, BC's, all herbal medications, fish oil, and all vitamins.             Do NOT Smoke (Tobacco/Vaping)  24 hours prior to your procedure   If you use a CPAP at night, you may bring your mask for your overnight stay.   Contacts, glasses, hearing aids, dentures or partials may not be worn into surgery, please bring cases for these belongings   For patients admitted to the hospital, discharge time will be determined by your treatment team.   Patients discharged the day of surgery will not be allowed to drive home, and someone needs to stay with them for 24 hours.     SURGICAL WAITING ROOM VISITATION Patients having surgery or a procedure may have no more than 2 support people in the waiting area -  these visitors may rotate.   Children under the age of 1 must have an adult with them who is not the patient. If the patient needs to stay at the hospital during part of their recovery, the visitor guidelines for inpatient rooms apply. Pre-op nurse will coordinate an appropriate time for 1 support person to accompany patient in pre-op.  This support person may not rotate.    Please refer to [email or web address] for the visitor guidelines for Inpatients (after your surgery is over and you are in a regular room).      Special instructions:     Oral Hygiene is also important to reduce your risk of infection.  Remember - BRUSH YOUR TEETH THE MORNING OF SURGERY WITH YOUR REGULAR TOOTHPASTE     [place]- Preparing For Surgery   Before surgery, you can play an important role. Because skin is not sterile, your skin needs to be as free of germs as possible. You can reduce the number of germs on your skin by washing with CHG (chlorahexidine gluconate) Soap before surgery.  CHG is an antiseptic cleaner  which kills germs and bonds with the skin to continue killing germs even after washing.       Please do not use if you have an allergy to CHG or antibacterial soaps. If your skin becomes reddened/irritated stop using the CHG.  Do not shave (including legs and underarms) for at least 48 hours prior to first CHG shower. It is OK to shave your face.   Please follow these instructions carefully.                                                                                                                                  Shower the NIGHT BEFORE SURGERY and the MORNING OF SURGERY with CHG Soap.  If you chose to wash your hair, wash your hair first as usual with your normal shampoo. After you shampoo, rinse your hair and body thoroughly to remove the shampoo.  Then [employer] and genitals (private parts) with your normal soap and rinse thoroughly to remove soap.   After that Use CHG Soap as you would any other liquid soap. You can apply CHG directly to the skin and wash gently with a scrungie or a clean washcloth.    Apply the CHG Soap to your body ONLY FROM THE NECK DOWN.  Do not use on open wounds or open sores. Avoid contact with your eyes, ears, mouth and genitals (private parts). Wash Face and genitals (private parts)  with your normal soap.    Wash thoroughly, paying special attention to the area where your surgery will be performed.   Thoroughly rinse your body with warm water from the neck down.   DO NOT shower/wash with your normal soap after using and rinsing off the CHG Soap.   Pat yourself dry with a CLEAN TOWEL.   Wear CLEAN PAJAMAS to bed the night  before surgery   Place CLEAN SHEETS on your bed the night before your surgery   DO NOT SLEEP WITH PETS.     Day of Surgery:   Take a shower with CHG soap. Do not wear jewelry or makeup. Do not wear lotions, powders, perfumes or deodorant. Do not shave 48 hours prior to surgery.   Do not bring valuables to the hospital. [facility] is not responsible for any belongings or valuables.   Do not wear nail polish, gel polish, artificial nails, or any other type of covering on natural nails (fingers and toes) If you have artificial nails or gel coating that need to be removed by a nail salon, please have this removed prior to surgery. Artificial nails or gel coating may interfere with anesthesia's ability to adequately monitor your vital signs.   Wear Clean/Comfortable clothing the morning of surgery  Remember to brush your teeth WITH YOUR REGULAR TOOTHPASTE.       If you received a COVID test during your pre-op visit, it is requested that you wear a mask when out in public, stay away from anyone that may not be feeling well, and notify your surgeon if you develop symptoms. If you have been in contact with anyone that has tested positive in the last 10 days, please notify your surgeon.     Please read over the following fact sheets that you were given.                 Electronically signed by Gleason, Ginger E, RN at 11/21/2022 11:56 AM     Pre-Admission Testing 60 on 11/22/2022       Detailed Report      Note shared with patient Additional Documentation  Encounter Info: Billing Info,   History,   Allergies,   Detailed Report

## 2022-11-22 NOTE — Progress Notes (Addendum)
PCP -  Dr. Gweneth Dimitri Cardiologist - Dr. Riley Lam  PPM/ICD - denies Device Orders - n/a Rep Notified - n/a  Chest x-ray - n/a EKG - 02/10/2022 Stress Test -  ECHO - 10/13/2022 Cardiac Cath -   Sleep Study - denies CPAP - denies  Non-diabetic  Blood Thinner Instructions: Xarelto, last dose 11/23/2022 Aspirin Instructions: n/a  ERAS Protcol - Yes, until 1230 PRE-SURGERY Ensure  COVID TEST- n/a   Anesthesia review: Yes. PE, DVT, graves disease, high cholesterol  Patient denies shortness of breath, fever, cough and chest pain at PAT appointment   All instructions explained to the patient, with a verbal understanding of the material. Patient agrees to go over the instructions while at home for a better understanding. Patient also instructed to self quarantine after being tested for COVID-19. The opportunity to ask questions was provided.

## 2022-11-23 NOTE — Anesthesia Preprocedure Evaluation (Addendum)
Anesthesia Evaluation  Patient identified by MRN, date of birth, ID band Patient awake    Reviewed: Allergy & Precautions, NPO status , Patient's Chart, lab work & pertinent test results  History of Anesthesia Complications (+) PONV and history of anesthetic complications  Airway Mallampati: II  TM Distance: >3 FB Neck ROM: Full    Dental  (+) Dental Advisory Given, Teeth Intact   Pulmonary former smoker   breath sounds clear to auscultation       Cardiovascular hypertension, Pt. on medications + DVT  + Valvular Problems/Murmurs AI  Rhythm:Regular Rate:Normal     Neuro/Psych  PSYCHIATRIC DISORDERS Anxiety Depression    negative neurological ROS     GI/Hepatic Neg liver ROS,GERD  ,,  Endo/Other  Hypothyroidism      Renal/GU negative Renal ROS     Musculoskeletal  (+) Arthritis , Osteoarthritis,   Psoriatic arthritis    Abdominal   Peds  Hematology  (+) Blood dyscrasia (on xarelto)   Anesthesia Other Findings   Reproductive/Obstetrics                             Lab Results  Component Value Date   WBC 5.8 11/22/2022   HGB 12.6 11/22/2022   HCT 38.6 11/22/2022   MCV 95.3 11/22/2022   PLT 247 11/22/2022   Lab Results  Component Value Date   CREATININE 0.85 07/11/2022   BUN 16 07/11/2022   NA 140 07/11/2022   K 4.1 07/11/2022   CL 98 07/11/2022   CO2 26 07/11/2022    Anesthesia Physical Anesthesia Plan  ASA: 3  Anesthesia Plan: General   Post-op Pain Management: GA combined w/ Regional for post-op pain and Ofirmev IV (intra-op)*   Induction: Intravenous  PONV Risk Score and Plan: 4 or greater and Treatment may vary due to age or medical condition, Ondansetron, Dexamethasone and Propofol infusion  Airway Management Planned: Mask and Natural Airway  Additional Equipment: None  Intra-op Plan:   Post-operative Plan: Extubation in OR  Informed Consent: I have  reviewed the patients History and Physical, chart, labs and discussed the procedure including the risks, benefits and alternatives for the proposed anesthesia with the patient or authorized representative who has indicated his/her understanding and acceptance.     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 Last seen by Dr. Izora Ribas 10/03/22 and noted to be doing well, orthostatic HTN improved on low BP regimen. No changes to management, 9 month followup recommended.   Pt reports LD Xarelto 11/23/22.  Follows with rheumatologist Dr. Dierdre Forth for hx of arthropathic psoriasis maintained on Tremfya.  CMP 10/31/22 from PCP office reviewed, WNL, creatinine 0.74, sodium 139, potassium 4.6.  CBC 11/22/22 reviewed, WNL.  EKG 02/10/22: SR with rare PVCs. Rate 82. LAD.  Event monitor 02/13/22:   Patient had a minimum heart rate of 49 bpm, maximum heart rate of 179 bpm, and average heart rate of 77 bpm.   Predominant underlying rhythm was sinus rhythm.   Paroxysms of regular, supraventricular tachycardia.   Longest episode 32 seconds.  Symptoms were not triggered on device during this.   Isolated PACs were rare (<1.0%).   Isolated PVCs were occasional (2%).   Triggered and diary events associated with sinus rhythm, sinus  tachycardia, PACs, and PVCs.  Paroxsymal SVT without device trigger for symptoms.  TTE 02/24/22: 1. Left ventricular ejection fraction, by estimation, is 60 to 65%. The  left ventricle has normal function. The left ventricle has no regional  wall motion abnormalities. Left ventricular diastolic parameters are  consistent with Grade I diastolic  dysfunction (impaired relaxation).  2. Right ventricular systolic function  is normal. The right ventricular  size is normal. Tricuspid regurgitation signal is inadequate for assessing  PA pressure.  3. The mitral valve is degenerative. No evidence of mitral valve  regurgitation.  4. Aortic valve regurgitation is mild to moderate. Aortic valve sclerosis  is present, with no evidence of aortic valve stenosis.  5. Aneurysm of the ascending aorta, measuring 41 mm.  6. The inferior vena cava is normal in size with greater than 50%  respiratory variability, suggesting right atrial pressure of 3 mmHg.   )        Anesthesia Quick Evaluation

## 2022-11-23 NOTE — Progress Notes (Signed)
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 Last seen by Dr. Izora Ribas 10/03/22 and noted to be doing well, orthostatic HTN improved on low BP regimen. No changes to management, 9 month followup recommended.   Pt reports LD Xarelto 11/23/22.  Follows with rheumatologist Dr. Dierdre Forth for hx of arthropathic psoriasis maintained on Tremfya.  CMP 10/31/22 from PCP office reviewed, WNL, creatinine 0.74, sodium 139, potassium 4.6.  CBC 11/22/22 reviewed, WNL.  EKG 02/10/22: SR with rare PVCs. Rate 82. LAD.  Event monitor 02/13/22:   Patient had a minimum heart rate of 49 bpm, maximum heart rate of 179 bpm, and average heart rate of 77 bpm.   Predominant underlying rhythm was sinus rhythm.   Paroxysms of regular, supraventricular tachycardia.   Longest episode 32 seconds.  Symptoms were not triggered on device during this.   Isolated PACs were rare (<1.0%).   Isolated PVCs were occasional (2%).   Triggered and diary events associated with sinus rhythm, sinus tachycardia, PACs, and PVCs.   Paroxsymal SVT without device trigger for symptoms.  TTE 02/24/22:  1. Left ventricular ejection fraction, by estimation, is 60 to 65%. The  left ventricle has normal function. The left ventricle has no regional  wall motion abnormalities. Left ventricular diastolic parameters are  consistent with Grade I diastolic  dysfunction (impaired relaxation).   2. Right ventricular systolic function is normal. The right ventricular  size is normal. Tricuspid regurgitation signal is inadequate for assessing  PA pressure.   3. The mitral valve is degenerative. No evidence of mitral valve  regurgitation.   4. Aortic valve regurgitation is mild to moderate. Aortic valve sclerosis  is present, with no evidence of aortic valve stenosis.   5. Aneurysm of the ascending aorta, measuring 41  mm.   6. The inferior vena cava is normal in size with greater than 50%  respiratory variability, suggesting right atrial pressure of 3 mmHg.     Zannie Cove [facility] Short Stay Center/Anesthesiology Phone [phone] 11/23/2022 1:05 PM

## 2022-11-27 ENCOUNTER — Ambulatory Visit (HOSPITAL_COMMUNITY)
Admission: RE | Admit: 2022-11-27 | Discharge: 2022-11-27 | Disposition: A | Discharge status: Home / Self Care | Payer: Medicare HMO | Attending: General Surgery | Admitting: General Surgery

## 2022-11-27 ENCOUNTER — Ambulatory Visit (HOSPITAL_BASED_OUTPATIENT_CLINIC_OR_DEPARTMENT_OTHER): Payer: Medicare HMO | Admitting: Anesthesiology

## 2022-11-27 ENCOUNTER — Encounter (HOSPITAL_COMMUNITY): Payer: Self-pay | Admitting: General Surgery

## 2022-11-27 ENCOUNTER — Other Ambulatory Visit: Payer: Self-pay

## 2022-11-27 ENCOUNTER — Ambulatory Visit (HOSPITAL_COMMUNITY): Payer: Medicare HMO | Admitting: Physician Assistant

## 2022-11-27 ENCOUNTER — Encounter (HOSPITAL_COMMUNITY)
Admission: RE | Disposition: A | Discharge status: Home / Self Care | Payer: Self-pay | Source: Home / Self Care | Attending: General Surgery

## 2022-11-27 DIAGNOSIS — F419 Anxiety disorder, unspecified: Secondary | ICD-10-CM | POA: Insufficient documentation

## 2022-11-27 DIAGNOSIS — I10 Essential (primary) hypertension: Secondary | ICD-10-CM | POA: Insufficient documentation

## 2022-11-27 DIAGNOSIS — Z7901 Long term (current) use of anticoagulants: Secondary | ICD-10-CM | POA: Insufficient documentation

## 2022-11-27 DIAGNOSIS — K4091 Unilateral inguinal hernia, without obstruction or gangrene, recurrent: Secondary | ICD-10-CM | POA: Diagnosis not present

## 2022-11-27 DIAGNOSIS — Z86718 Personal history of other venous thrombosis and embolism: Secondary | ICD-10-CM | POA: Diagnosis not present

## 2022-11-27 DIAGNOSIS — I351 Nonrheumatic aortic (valve) insufficiency: Secondary | ICD-10-CM | POA: Insufficient documentation

## 2022-11-27 DIAGNOSIS — Z87891 Personal history of nicotine dependence: Secondary | ICD-10-CM

## 2022-11-27 DIAGNOSIS — F32A Depression, unspecified: Secondary | ICD-10-CM | POA: Insufficient documentation

## 2022-11-27 DIAGNOSIS — K219 Gastro-esophageal reflux disease without esophagitis: Secondary | ICD-10-CM | POA: Diagnosis not present

## 2022-11-27 DIAGNOSIS — K4090 Unilateral inguinal hernia, without obstruction or gangrene, not specified as recurrent: Secondary | ICD-10-CM | POA: Diagnosis not present

## 2022-11-27 HISTORY — PX: INGUINAL HERNIA REPAIR: SHX194

## 2022-11-27 SURGERY — REPAIR, HERNIA, INGUINAL, LAPAROSCOPIC
Anesthesia: General | Laterality: Left

## 2022-11-27 MED ORDER — LACTATED RINGERS IV SOLN: INTRAVENOUS | Status: DC

## 2022-11-27 MED ORDER — CHLORHEXIDINE GLUCONATE 0.12 % MT SOLN
15.0000 mL | Freq: Once | OROMUCOSAL | Status: AC
  Administered 2022-11-27: 15 mL via OROMUCOSAL
  Filled 2022-11-27: qty 15

## 2022-11-27 MED ORDER — OXYCODONE HCL 5 MG/5ML PO SOLN: 5.0000 mg | Freq: Once | ORAL | Status: DC | PRN

## 2022-11-27 MED ORDER — DEXAMETHASONE SODIUM PHOSPHATE 10 MG/ML IJ SOLN
INTRAMUSCULAR | Status: DC | PRN
  Administered 2022-11-27: 10 mg via INTRAVENOUS

## 2022-11-27 MED ORDER — FENTANYL CITRATE (PF) 250 MCG/5ML IJ SOLN
INTRAMUSCULAR | Status: DC | PRN
  Administered 2022-11-27: 75 ug via INTRAVENOUS
  Administered 2022-11-27: 50 ug via INTRAVENOUS

## 2022-11-27 MED ORDER — VANCOMYCIN HCL IN DEXTROSE 1-5 GM/200ML-% IV SOLN
1000.0000 mg | INTRAVENOUS | Status: AC
  Administered 2022-11-27: 1000 mg via INTRAVENOUS
  Filled 2022-11-27: qty 200

## 2022-11-27 MED ORDER — GLYCOPYRROLATE 0.2 MG/ML IJ SOLN
INTRAMUSCULAR | Status: DC | PRN
Start: 2022-11-27 — End: 2022-11-27
  Administered 2022-11-27: .2 mg via INTRAVENOUS

## 2022-11-27 MED ORDER — ORAL CARE MOUTH RINSE: 15.0000 mL | Freq: Once | OROMUCOSAL | Status: AC

## 2022-11-27 MED ORDER — FENTANYL CITRATE (PF) 250 MCG/5ML IJ SOLN
INTRAMUSCULAR | Status: AC
  Filled 2022-11-27: qty 5

## 2022-11-27 MED ORDER — FENTANYL CITRATE (PF) 100 MCG/2ML IJ SOLN
INTRAMUSCULAR | Status: AC
  Filled 2022-11-27: qty 2

## 2022-11-27 MED ORDER — EPHEDRINE SULFATE-NACL 50-0.9 MG/10ML-% IV SOSY
PREFILLED_SYRINGE | INTRAVENOUS | Status: DC | PRN
  Administered 2022-11-27: 5 mg via INTRAVENOUS

## 2022-11-27 MED ORDER — ACETAMINOPHEN 160 MG/5ML PO SOLN: 325.0000 mg | ORAL | Status: DC | PRN

## 2022-11-27 MED ORDER — OXYCODONE HCL 5 MG PO TABS: 5.0000 mg | ORAL_TABLET | Freq: Once | ORAL | Status: DC | PRN

## 2022-11-27 MED ORDER — TRAMADOL HCL 50 MG PO TABS: 50.0000 mg | ORAL_TABLET | Freq: Four times a day (QID) | ORAL | 0 refills | Status: DC | PRN

## 2022-11-27 MED ORDER — BUPIVACAINE HCL 0.25 % IJ SOLN
INTRAMUSCULAR | Status: DC | PRN
  Administered 2022-11-27: 7 mL

## 2022-11-27 MED ORDER — LIDOCAINE 2% (20 MG/ML) 5 ML SYRINGE
INTRAMUSCULAR | Status: DC | PRN
  Administered 2022-11-27: 100 mg via INTRAVENOUS

## 2022-11-27 MED ORDER — CHLORHEXIDINE GLUCONATE CLOTH 2 % EX PADS: 6.0000 | MEDICATED_PAD | Freq: Once | CUTANEOUS | Status: DC

## 2022-11-27 MED ORDER — OXYCODONE HCL 5 MG PO TABS
5.0000 mg | ORAL_TABLET | Freq: Once | ORAL | Status: AC | PRN
  Administered 2022-11-27: 5 mg via ORAL

## 2022-11-27 MED ORDER — ONDANSETRON HCL 4 MG/2ML IJ SOLN
4.0000 mg | Freq: Once | INTRAMUSCULAR | Status: AC | PRN
  Administered 2022-11-27: 4 mg via INTRAVENOUS

## 2022-11-27 MED ORDER — ONDANSETRON HCL 4 MG/2ML IJ SOLN: 4.0000 mg | Freq: Once | INTRAMUSCULAR | Status: DC | PRN

## 2022-11-27 MED ORDER — ONDANSETRON HCL 4 MG/2ML IJ SOLN
INTRAMUSCULAR | Status: DC | PRN
  Administered 2022-11-27: 4 mg via INTRAVENOUS

## 2022-11-27 MED ORDER — OXYCODONE HCL 5 MG/5ML PO SOLN: 5.0000 mg | Freq: Once | ORAL | Status: AC | PRN

## 2022-11-27 MED ORDER — ACETAMINOPHEN 500 MG PO TABS
1000.0000 mg | ORAL_TABLET | ORAL | Status: AC
  Administered 2022-11-27: 1000 mg via ORAL
  Filled 2022-11-27: qty 2

## 2022-11-27 MED ORDER — ENSURE PRE-SURGERY PO LIQD: 296.0000 mL | Freq: Once | ORAL | Status: DC

## 2022-11-27 MED ORDER — SUGAMMADEX SODIUM 200 MG/2ML IV SOLN
INTRAVENOUS | Status: DC | PRN
  Administered 2022-11-27: 200 mg via INTRAVENOUS

## 2022-11-27 MED ORDER — FENTANYL CITRATE (PF) 100 MCG/2ML IJ SOLN
25.0000 ug | INTRAMUSCULAR | Status: DC | PRN
  Administered 2022-11-27: 25 ug via INTRAVENOUS
  Administered 2022-11-27: 50 ug via INTRAVENOUS
  Administered 2022-11-27: 25 ug via INTRAVENOUS

## 2022-11-27 MED ORDER — OXYCODONE HCL 5 MG PO TABS
ORAL_TABLET | ORAL | Status: AC
  Filled 2022-11-27: qty 1

## 2022-11-27 MED ORDER — ROCURONIUM BROMIDE 10 MG/ML (PF) SYRINGE
PREFILLED_SYRINGE | INTRAVENOUS | Status: DC | PRN
  Administered 2022-11-27: 50 mg via INTRAVENOUS

## 2022-11-27 MED ORDER — FENTANYL CITRATE (PF) 100 MCG/2ML IJ SOLN: 25.0000 ug | INTRAMUSCULAR | Status: DC | PRN

## 2022-11-27 MED ORDER — PHENYLEPHRINE 80 MCG/ML (10ML) SYRINGE FOR IV PUSH (FOR BLOOD PRESSURE SUPPORT)
PREFILLED_SYRINGE | INTRAVENOUS | Status: DC | PRN
  Administered 2022-11-27: 80 ug via INTRAVENOUS

## 2022-11-27 MED ORDER — ACETAMINOPHEN 325 MG PO TABS: 325.0000 mg | ORAL_TABLET | ORAL | Status: DC | PRN

## 2022-11-27 MED ORDER — ONDANSETRON HCL 4 MG/2ML IJ SOLN
INTRAMUSCULAR | Status: AC
  Filled 2022-11-27: qty 2

## 2022-11-27 MED ORDER — MEPERIDINE HCL 25 MG/ML IJ SOLN: 6.2500 mg | INTRAMUSCULAR | Status: DC | PRN

## 2022-11-27 SURGICAL SUPPLY — 42 items
ADH SKN CLS APL DERMABOND .7 (GAUZE/BANDAGES/DRESSINGS) ×1
BAG COUNTER SPONGE SURGICOUNT (BAG) ×2 IMPLANT
BAG SPNG CNTER NS LX DISP (BAG) ×1
CANISTER SUCT 3000ML PPV (MISCELLANEOUS) IMPLANT
COVER SURGICAL LIGHT HANDLE (MISCELLANEOUS) ×2 IMPLANT
DERMABOND ADVANCED .7 DNX12 (GAUZE/BANDAGES/DRESSINGS) ×2 IMPLANT
DISSECTOR BLUNT TIP ENDO 5MM (MISCELLANEOUS) IMPLANT
ELECT REM PT RETURN 9FT ADLT (ELECTROSURGICAL) ×1
ELECTRODE REM PT RTRN 9FT ADLT (ELECTROSURGICAL) ×2 IMPLANT
ENDOLOOP SUT PDS II 0 18 (SUTURE) IMPLANT
GLOVE BIO SURGEON STRL SZ7.5 (GLOVE) ×4 IMPLANT
GOWN STRL REUS W/ TWL LRG LVL3 (GOWN DISPOSABLE) ×4 IMPLANT
GOWN STRL REUS W/ TWL XL LVL3 (GOWN DISPOSABLE) ×2 IMPLANT
GOWN STRL REUS W/TWL LRG LVL3 (GOWN DISPOSABLE) ×2
GOWN STRL REUS W/TWL XL LVL3 (GOWN DISPOSABLE) ×1
IRRIG SUCT STRYKERFLOW 2 WTIP (MISCELLANEOUS)
IRRIGATION SUCT STRKRFLW 2 WTP (MISCELLANEOUS) IMPLANT
KIT BASIN OR (CUSTOM PROCEDURE TRAY) ×2 IMPLANT
KIT TURNOVER KIT B (KITS) ×2 IMPLANT
MESH 3DMAX 4X6 LT LRG (Mesh General) IMPLANT
NDL INSUFFLATION 14GA 120MM (NEEDLE) IMPLANT
NEEDLE INSUFFLATION 14GA 120MM (NEEDLE)
NS IRRIG 1000ML POUR BTL (IV SOLUTION) ×2 IMPLANT
PAD ARMBOARD 7.5X6 YLW CONV (MISCELLANEOUS) ×4 IMPLANT
RELOAD STAPLE 4.0 BLU F/HERNIA (INSTRUMENTS) IMPLANT
RELOAD STAPLE 4.8 BLK F/HERNIA (STAPLE) IMPLANT
RELOAD STAPLE HERNIA 4.0 BLUE (INSTRUMENTS) ×1
RELOAD STAPLE HERNIA 4.8 BLK (STAPLE)
SCISSORS LAP 5X35 DISP (ENDOMECHANICALS) ×2 IMPLANT
SET TUBE SMOKE EVAC HIGH FLOW (TUBING) ×2 IMPLANT
STAPLER HERNIA 12 8.5 360D (INSTRUMENTS) IMPLANT
SUT MNCRL AB 4-0 PS2 18 (SUTURE) ×2 IMPLANT
SUT VIC AB 1 CT1 27 (SUTURE)
SUT VIC AB 1 CT1 27XBRD ANBCTR (SUTURE) IMPLANT
TOWEL GREEN STERILE (TOWEL DISPOSABLE) ×2 IMPLANT
TOWEL GREEN STERILE FF (TOWEL DISPOSABLE) ×2 IMPLANT
TRAY LAPAROSCOPIC MC (CUSTOM PROCEDURE TRAY) ×2 IMPLANT
TROCAR OPTICAL SHORT 5MM (TROCAR) ×2 IMPLANT
TROCAR OPTICAL SLV SHORT 5MM (TROCAR) ×2 IMPLANT
TROCAR Z THREAD OPTICAL 12X100 (TROCAR) ×2 IMPLANT
WARMER LAPAROSCOPE (MISCELLANEOUS) ×2 IMPLANT
WATER STERILE IRR 1000ML POUR (IV SOLUTION) ×2 IMPLANT

## 2022-11-27 NOTE — Anesthesia Postprocedure Evaluation (Signed)
Anesthesia Post Note  Patient: Ashley Griffith  Procedure(s) Performed: LAPAROSCOPIC  LEFT INGUINAL HERNIA REPAIR WITH MESH (Left)     Patient location during evaluation: PACU Anesthesia Type: General Level of consciousness: awake and alert Pain management: pain level controlled Vital Signs Assessment: post-procedure vital signs reviewed and stable Respiratory status: spontaneous breathing, nonlabored ventilation, respiratory function stable and patient connected to nasal cannula oxygen Cardiovascular status: blood pressure returned to baseline and stable Postop Assessment: no apparent nausea or vomiting Anesthetic complications: no   There were no known notable events for this encounter.  Last Vitals:  Vitals:   11/27/22 1551 11/27/22 1600  BP: 132/80 122/81  Pulse: 87 79  Resp: 18 12  Temp: (!) 36.1 C   SpO2: 96% 99%    Last Pain:  Vitals:   11/27/22 1551  TempSrc:   PainSc: 0-No pain                 ,

## 2022-11-27 NOTE — Anesthesia Procedure Notes (Signed)
Procedure Name: Intubation Date/Time: 11/27/2022 2:59 PM  Performed by: Owens Loffler, RNPre-anesthesia Checklist: Patient identified, Emergency Drugs available, Suction available, Patient being monitored and Timeout performed Patient Re-evaluated:Patient Re-evaluated prior to induction Oxygen Delivery Method: Circle system utilized Preoxygenation: Pre-oxygenation with 100% oxygen Induction Type: IV induction Ventilation: Mask ventilation without difficulty Laryngoscope Size: Mac and 3 Grade View: Grade I Tube type: Oral Tube size: 7.5 mm Number of attempts: 1 Airway Equipment and Method: Stylet Placement Confirmation: ETT inserted through vocal cords under direct vision, positive ETCO2, CO2 detector and breath sounds checked- equal and bilateral Secured at: 22 cm Tube secured with: Tape Dental Injury: Teeth and Oropharynx as per pre-operative assessment

## 2022-11-27 NOTE — Discharge Instructions (Signed)

## 2022-11-27 NOTE — Op Note (Signed)
11/27/2022  3:36 PM  PATIENT:  Ashley Griffith  77 y.o. female  PRE-OPERATIVE DIAGNOSIS:  LEFT INGUINAL HERNIA  POST-OPERATIVE DIAGNOSIS:  LEFT INDIRECT INGUINAL HERNIA  PROCEDURE:  Procedure(s): LAPAROSCOPIC VS OPEN LEFT INGUINAL HERNIA REPAIR WITH MESH (Left)  SURGEON:  Surgeons and Role:    * Axel Filler, MD - Primary  ANESTHESIA:   local and general  EBL:  minimal   BLOOD ADMINISTERED:none  DRAINS: none   LOCAL MEDICATIONS USED:  BUPIVICAINE   SPECIMEN:  No Specimen  DISPOSITION OF SPECIMEN:  N/A  COUNTS:  YES  TOURNIQUET:  * No tourniquets in log *  DICTATION: .Dragon Dictation  Counts: reported as correct x 2   Findings:  The patient had a small sized left indirect hernia and a small sized cord lipoma   Indications for procedure:  The patient is a 77 year old female with a left inguinal hernia for several months. Patient complained of symptomatology to the left inguinal area. The patient was taken back for elective inguinal hernia repair.   Details of the procedure: The patient was taken back to the operating room. The patient was placed in supine position with bilateral SCDs in place.  The patient was prepped and draped in the usual sterile fashion.  After appropriate anitbiotics were confirmed, a time-out was confirmed and all facts were verified.   0.25% Marcaine was used to infiltrate the umbilical area. A 11-blade was used to cut down the skin and blunt dissection was used to get the anterior fashion.  The anterior fascia was incised approximately 1 cm and the muscles were retracted laterally. Blunt dissection was then used to create a space in the preperitoneal area. At this time a 10 mm camera was then introduced into the space and advanced the pubic tubercle and a 12 mm trocar was placed over this and insufflation was started.  At this time and space was created from medial to laterally the preperitoneal space.  Cooper's ligament was initially cleaned  off.  The hernia sac was identified in the indirect space. Dissection of the hernia sac and round ligament was dissected.  The hernia sac was dissected off the round ligament.  The round ligament was cauterized and transected.  There was a small tear in the peritoneum.  This was ligated using a 0 PDS x 1.  Once the hernia sac was taken down to approximately the umbilicus a Left Bard 3D Max mesh, size: Large, was  introduced into the preperitoneal space.  The mesh was brought over to cover the direct and indirect hernia spaces.  This was anchored into place and secured to Cooper's ligament with 4.67mm staples from a [place] hernia stapler. It was anchored to the anterior abdominal wall with 4.8 mm staples. The hernia sac was seen lying posterior to the mesh. There was no staples placed laterally. The insufflation was evacuated and the peritoneum was seen posterior to the mesh. The trochars were removed. The anterior fascia was reapproximated using #1 Vicryl on a UR- 6.  Intra-abdominal air was evacuated and the Veress needle removed. The skin was reapproximated using 4-0 Monocryl subcuticular fashion and Dermabond. The patient was awakened from general anesthesia and taken to recovery in stable condition.    PLAN OF CARE: Discharge to home after PACU  PATIENT DISPOSITION:  PACU - hemodynamically stable.   Delay start of Pharmacological VTE agent (>24hrs) due to surgical blood loss or risk of bleeding: not applicable

## 2022-11-27 NOTE — Transfer of Care (Signed)
Immediate Anesthesia Transfer of Care Note  Patient: Ashley Griffith  Procedure(s) Performed: LAPAROSCOPIC  LEFT INGUINAL HERNIA REPAIR WITH MESH (Left)  Patient Location: PACU  Anesthesia Type:General  Level of Consciousness: awake, oriented, drowsy, and patient cooperative  Airway & Oxygen Therapy: Patient Spontanous Breathing  Post-op Assessment: Report given to RN, Post -op Vital signs reviewed and stable, and Patient moving all extremities  Post vital signs: Reviewed and stable  Last Vitals:  Vitals Value Taken Time  BP 132/80 11/27/22 1551  Temp    Pulse 83 11/27/22 1557  Resp 11 11/27/22 1557  SpO2 98 % 11/27/22 1557  Vitals shown include unfiled device data.  Last Pain:  Vitals:   11/27/22 1359  TempSrc:   PainSc: 0-No pain      Patients Stated Pain Goal: 0 (11/27/22 1359)  Complications: There were no known notable events for this encounter.

## 2022-11-27 NOTE — H&P (Signed)
Chief Complaint: New Consultation (Unilateral femoral hernia w/o obstruction or gangrene)       History of Present Illness: Ashley Griffith is a 77 y.o. female who is seen today as an office consultation at the request of Dr. Corliss Blacker for evaluation of New Consultation (Unilateral femoral hernia w/o obstruction or gangrene) .   Patient is a 77 year old female with a history of dyspnea on exertion, DVT/PE on Xarelto.  Who comes in secondary to a left inguinal hernia.  Patient does have a history of previous Pfannenstiel incision for likely ectopic pregnancy evacuation in the past.   Patient states she had 6 weeks history of left inguinal bulge.  States that she has no pain to the area but has noticed its gotten larger.  She does state reseed on its own.   She has a chronic history of constipation.  She has been taking MiraLAX as well as prune juice.       Review of Systems: A complete review of systems was obtained from the patient.  I have reviewed this information and discussed as appropriate with the patient.  See HPI as well for other ROS.   Review of Systems  Constitutional:  Negative for fever.  HENT:  Negative for congestion.   Eyes:  Negative for blurred vision.  Respiratory:  Negative for cough, shortness of breath and wheezing.   Cardiovascular:  Negative for chest pain and palpitations.  Gastrointestinal:  Negative for heartburn.  Genitourinary:  Negative for dysuria.  Musculoskeletal:  Negative for myalgias.  Skin:  Negative for rash.  Neurological:  Negative for dizziness and headaches.  Psychiatric/Behavioral:  Negative for depression and suicidal ideas.   All other systems reviewed and are negative.       Medical History: Past Medical History Past Medical History: DiagnosisDate            Anxiety                         Arthritis                         GERD (gastroesophageal reflux disease)                   Hyperlipidemia                         Thyroid  disease                  There is no problem list on file for this patient.     Past Surgical History History reviewed. No pertinent surgical history.     Allergies Allergies AllergenReactions ErythromycinOther (See Comments), Hives and Nausea And Vomiting PenicillinHives AdhesiveRash and Itching Adhesive Tape-SiliconesRash SecukinumabItching, Other (See Comments) and Dermatitis                         unknown ThimerosalItching, Other (See Comments) and Unknown                         Other Reaction(s): turned eyes red and blurry   Red and blurry eyes   Red and blurry eyes  Red eyes   Red and blurry eyes, Red eyes Triamcinolone AcetonideOther (See Comments)  Facial flush       Current Outpatient Medications on File Prior to Visit MedicationSigDispenseRefill            acetaminophen (TYLENOL) 650 MG ER tablet         Take by mouth                                     biotin 10,000 mcg Cap            1 tablet Orally Once a day                               buPROPion (WELLBUTRIN XL) 300 MG XL tablet   Take by mouth                                     busPIRone (BUSPAR) 5 MG tablet    Take 5 mg by mouth 2 (two) times daily                                 calcium carbonate (TUMS) 200 mg calcium (500 mg) chewable tablet        Take by mouth                                 clotrimazole-betamethasone (LOTRISONE) 1-0.05 % cream           APPLY TWICE A DAY FOR 10 DAYS                                    ergocalciferol, vitamin D2, 1,250 mcg (50,000 unit) capsule Take 50,000 Units by mouth every 7 (seven) days                           gabapentin (NEURONTIN) 100 MG capsule Take by mouth                                     leflunomide (ARAVA) 20 MG tablet    Take 1 tablet by mouth once daily                               levothyroxine (SYNTHROID) 88 MCG tablet 1 tablet in the morning on an empty stomach Orally Once a day for 90 days                                  omeprazole (PRILOSEC) 40 MG DR capsule           Take by mouth                                     XARELTO 20 mg tablet          TAKE 1 TABLET DAILY WITH   FOOD  for 90                           No current facility-administered medications on file prior to visit.     Family History History reviewed. No pertinent family history.     Social History   Tobacco Use Smoking StatusNot on file Smokeless TobaccoNot on file     Social History Social History     Socioeconomic History Marital status:Married       Objective:    BP (!) 131/92   Pulse 74   Temp 97.9 F (36.6 C) (Oral)   Resp 18   Ht 5' 4.5" (1.638 m)   Wt 59 kg   SpO2 95%   BMI 21.97 kg/m   Physical Exam Constitutional:      Appearance: Normal appearance.  HENT:     Head: Normocephalic and atraumatic.     Mouth/Throat:     Mouth: Mucous membranes are moist.     Pharynx: Oropharynx is clear.  Eyes:     General: No scleral icterus.    Pupils: Pupils are equal, round, and reactive to light.  Cardiovascular:     Rate and Rhythm: Normal rate and regular rhythm.     Pulses: Normal pulses.     Heart sounds: No murmur heard.    No friction rub. No gallop.  Pulmonary:     Effort: Pulmonary effort is normal. No respiratory distress.     Breath sounds: Normal breath sounds. No stridor.  Abdominal:     General: Abdomen is flat.     Hernia: A hernia is present. Hernia is present in the left inguinal area.  Musculoskeletal:        General: No swelling.  Skin:    General: Skin is warm.  Neurological:     General: No focal deficit present.     Mental Status: She is alert and oriented to person, place, and time. Mental status is at baseline.  Psychiatric:        Mood and Affect: Mood normal.        Thought Content: Thought content normal.        Judgment: Judgment normal.          Assessment and Plan: Diagnoses and all orders for this visit:   Unilateral inguinal hernia without obstruction or gangrene,  recurrence not specified     Ashley Griffith is a 77 y.o. female    1.          We will proceed to the OR for a laparoscopic versus open left inguinal hernia repair with mesh. 2.         All risks and benefits were discussed with the patient, to generally include infection, bleeding, damage to surrounding structures, acute and chronic nerve pain, and recurrence. Alternatives were offered and described.  All questions were answered and the patient voiced understanding of the procedure and wishes to proceed at this point.             No follow-ups on file.   Axel Filler, MD, [facility], [facility]

## 2022-11-28 ENCOUNTER — Encounter (HOSPITAL_COMMUNITY): Payer: Self-pay | Admitting: General Surgery

## 2022-12-05 DIAGNOSIS — E559 Vitamin D deficiency, unspecified: Secondary | ICD-10-CM | POA: Diagnosis not present

## 2022-12-05 DIAGNOSIS — M545 Low back pain, unspecified: Secondary | ICD-10-CM | POA: Diagnosis not present

## 2022-12-05 DIAGNOSIS — M810 Age-related osteoporosis without current pathological fracture: Secondary | ICD-10-CM | POA: Diagnosis not present

## 2022-12-05 DIAGNOSIS — K5900 Constipation, unspecified: Secondary | ICD-10-CM | POA: Diagnosis not present

## 2022-12-05 DIAGNOSIS — E785 Hyperlipidemia, unspecified: Secondary | ICD-10-CM | POA: Diagnosis not present

## 2022-12-05 DIAGNOSIS — K219 Gastro-esophageal reflux disease without esophagitis: Secondary | ICD-10-CM | POA: Diagnosis not present

## 2022-12-05 DIAGNOSIS — F411 Generalized anxiety disorder: Secondary | ICD-10-CM | POA: Diagnosis not present

## 2022-12-05 DIAGNOSIS — M4800 Spinal stenosis, site unspecified: Secondary | ICD-10-CM | POA: Diagnosis not present

## 2022-12-05 DIAGNOSIS — M1990 Unspecified osteoarthritis, unspecified site: Secondary | ICD-10-CM | POA: Diagnosis not present

## 2022-12-05 DIAGNOSIS — I10 Essential (primary) hypertension: Secondary | ICD-10-CM | POA: Diagnosis not present

## 2022-12-05 DIAGNOSIS — I951 Orthostatic hypotension: Secondary | ICD-10-CM | POA: Diagnosis not present

## 2022-12-05 DIAGNOSIS — R32 Unspecified urinary incontinence: Secondary | ICD-10-CM | POA: Diagnosis not present

## 2022-12-19 DIAGNOSIS — Z1231 Encounter for screening mammogram for malignant neoplasm of breast: Secondary | ICD-10-CM | POA: Diagnosis not present

## 2023-01-11 DIAGNOSIS — Z6823 Body mass index (BMI) 23.0-23.9, adult: Secondary | ICD-10-CM | POA: Diagnosis not present

## 2023-01-11 DIAGNOSIS — E7800 Pure hypercholesterolemia, unspecified: Secondary | ICD-10-CM | POA: Diagnosis not present

## 2023-01-11 DIAGNOSIS — E039 Hypothyroidism, unspecified: Secondary | ICD-10-CM | POA: Diagnosis not present

## 2023-01-11 DIAGNOSIS — I10 Essential (primary) hypertension: Secondary | ICD-10-CM | POA: Diagnosis not present

## 2023-01-11 DIAGNOSIS — Z86711 Personal history of pulmonary embolism: Secondary | ICD-10-CM | POA: Diagnosis not present

## 2023-01-11 DIAGNOSIS — M8580 Other specified disorders of bone density and structure, unspecified site: Secondary | ICD-10-CM | POA: Diagnosis not present

## 2023-01-11 DIAGNOSIS — F321 Major depressive disorder, single episode, moderate: Secondary | ICD-10-CM | POA: Diagnosis not present

## 2023-01-11 DIAGNOSIS — F411 Generalized anxiety disorder: Secondary | ICD-10-CM | POA: Diagnosis not present

## 2023-01-11 DIAGNOSIS — L4050 Arthropathic psoriasis, unspecified: Secondary | ICD-10-CM | POA: Diagnosis not present

## 2023-01-15 ENCOUNTER — Other Ambulatory Visit (HOSPITAL_BASED_OUTPATIENT_CLINIC_OR_DEPARTMENT_OTHER): Payer: Self-pay

## 2023-01-15 MED ORDER — INFLUENZA VAC A&B SURF ANT ADJ 0.5 ML IM SUSY
0.5000 mL | PREFILLED_SYRINGE | Freq: Once | INTRAMUSCULAR | 0 refills | Status: AC
  Filled 2023-01-15: qty 0.5, 1d supply, fill #0

## 2023-01-15 MED ORDER — COVID-19 MRNA VAC-TRIS(PFIZER) 30 MCG/0.3ML IM SUSY
0.3000 mL | PREFILLED_SYRINGE | Freq: Once | INTRAMUSCULAR | 0 refills | Status: AC
  Filled 2023-01-15: qty 0.3, 1d supply, fill #0

## 2023-01-26 DIAGNOSIS — E039 Hypothyroidism, unspecified: Secondary | ICD-10-CM | POA: Diagnosis not present

## 2023-01-26 DIAGNOSIS — E7800 Pure hypercholesterolemia, unspecified: Secondary | ICD-10-CM | POA: Diagnosis not present

## 2023-01-26 DIAGNOSIS — I10 Essential (primary) hypertension: Secondary | ICD-10-CM | POA: Diagnosis not present

## 2023-01-30 DIAGNOSIS — Z0000 Encounter for general adult medical examination without abnormal findings: Secondary | ICD-10-CM | POA: Diagnosis not present

## 2023-01-30 DIAGNOSIS — Z9181 History of falling: Secondary | ICD-10-CM | POA: Diagnosis not present

## 2023-01-30 DIAGNOSIS — Z1331 Encounter for screening for depression: Secondary | ICD-10-CM | POA: Diagnosis not present

## 2023-01-30 DIAGNOSIS — Z23 Encounter for immunization: Secondary | ICD-10-CM | POA: Diagnosis not present

## 2023-01-30 DIAGNOSIS — Z6823 Body mass index (BMI) 23.0-23.9, adult: Secondary | ICD-10-CM | POA: Diagnosis not present

## 2023-02-14 DIAGNOSIS — E039 Hypothyroidism, unspecified: Secondary | ICD-10-CM | POA: Diagnosis not present

## 2023-02-19 DIAGNOSIS — Z111 Encounter for screening for respiratory tuberculosis: Secondary | ICD-10-CM | POA: Diagnosis not present

## 2023-02-19 DIAGNOSIS — E559 Vitamin D deficiency, unspecified: Secondary | ICD-10-CM | POA: Diagnosis not present

## 2023-02-19 DIAGNOSIS — M542 Cervicalgia: Secondary | ICD-10-CM | POA: Diagnosis not present

## 2023-02-19 DIAGNOSIS — M25531 Pain in right wrist: Secondary | ICD-10-CM | POA: Diagnosis not present

## 2023-02-19 DIAGNOSIS — L408 Other psoriasis: Secondary | ICD-10-CM | POA: Diagnosis not present

## 2023-02-19 DIAGNOSIS — Z6822 Body mass index (BMI) 22.0-22.9, adult: Secondary | ICD-10-CM | POA: Diagnosis not present

## 2023-02-19 DIAGNOSIS — L4050 Arthropathic psoriasis, unspecified: Secondary | ICD-10-CM | POA: Diagnosis not present

## 2023-02-19 DIAGNOSIS — Z79899 Other long term (current) drug therapy: Secondary | ICD-10-CM | POA: Diagnosis not present

## 2023-02-19 DIAGNOSIS — M8000XD Age-related osteoporosis with current pathological fracture, unspecified site, subsequent encounter for fracture with routine healing: Secondary | ICD-10-CM | POA: Diagnosis not present

## 2023-02-19 DIAGNOSIS — M1710 Unilateral primary osteoarthritis, unspecified knee: Secondary | ICD-10-CM | POA: Diagnosis not present

## 2023-03-05 ENCOUNTER — Ambulatory Visit (HOSPITAL_COMMUNITY): Payer: Medicare HMO | Attending: Cardiovascular Disease

## 2023-03-05 DIAGNOSIS — I351 Nonrheumatic aortic (valve) insufficiency: Secondary | ICD-10-CM | POA: Diagnosis not present

## 2023-03-05 DIAGNOSIS — I77819 Aortic ectasia, unspecified site: Secondary | ICD-10-CM

## 2023-03-05 LAB — ECHOCARDIOGRAM COMPLETE
Area-P 1/2: 2.22 cm2
P 1/2 time: 617 ms
S' Lateral: 2.8 cm

## 2023-03-08 DIAGNOSIS — M170 Bilateral primary osteoarthritis of knee: Secondary | ICD-10-CM | POA: Diagnosis not present

## 2023-03-09 ENCOUNTER — Other Ambulatory Visit: Payer: Self-pay

## 2023-03-09 DIAGNOSIS — I77819 Aortic ectasia, unspecified site: Secondary | ICD-10-CM

## 2023-03-09 DIAGNOSIS — I351 Nonrheumatic aortic (valve) insufficiency: Secondary | ICD-10-CM

## 2023-03-09 NOTE — Progress Notes (Signed)
Placed an order for Echo due in Nov 2025 per MD request: Results: Stable AI and aortic dilation Plan: Repeat study in one year   Christell Constant, MD

## 2023-04-10 DIAGNOSIS — M170 Bilateral primary osteoarthritis of knee: Secondary | ICD-10-CM | POA: Diagnosis not present

## 2023-04-12 DIAGNOSIS — E039 Hypothyroidism, unspecified: Secondary | ICD-10-CM | POA: Diagnosis not present

## 2023-04-17 DIAGNOSIS — M170 Bilateral primary osteoarthritis of knee: Secondary | ICD-10-CM | POA: Diagnosis not present

## 2023-04-24 DIAGNOSIS — M170 Bilateral primary osteoarthritis of knee: Secondary | ICD-10-CM | POA: Diagnosis not present

## 2023-06-11 DIAGNOSIS — M170 Bilateral primary osteoarthritis of knee: Secondary | ICD-10-CM | POA: Diagnosis not present

## 2023-06-26 DIAGNOSIS — E039 Hypothyroidism, unspecified: Secondary | ICD-10-CM | POA: Diagnosis not present

## 2023-07-17 ENCOUNTER — Other Ambulatory Visit (HOSPITAL_BASED_OUTPATIENT_CLINIC_OR_DEPARTMENT_OTHER): Payer: Self-pay

## 2023-07-17 MED ORDER — CAPVAXIVE 0.5 ML IM SOSY
0.5000 mL | PREFILLED_SYRINGE | Freq: Once | INTRAMUSCULAR | 0 refills | Status: AC
  Filled 2023-07-17: qty 0.5, 1d supply, fill #0

## 2023-07-20 ENCOUNTER — Ambulatory Visit: Payer: Medicare HMO | Attending: Internal Medicine | Admitting: Internal Medicine

## 2023-07-20 VITALS — BP 112/60 | HR 70 | Ht 65.0 in | Wt 143.6 lb

## 2023-07-20 DIAGNOSIS — I493 Ventricular premature depolarization: Secondary | ICD-10-CM

## 2023-07-20 DIAGNOSIS — I351 Nonrheumatic aortic (valve) insufficiency: Secondary | ICD-10-CM

## 2023-07-20 DIAGNOSIS — I10 Essential (primary) hypertension: Secondary | ICD-10-CM | POA: Diagnosis not present

## 2023-07-20 DIAGNOSIS — I77819 Aortic ectasia, unspecified site: Secondary | ICD-10-CM

## 2023-07-20 DIAGNOSIS — I951 Orthostatic hypotension: Secondary | ICD-10-CM | POA: Diagnosis not present

## 2023-07-20 NOTE — Progress Notes (Signed)
 Cardiology Office Note:    Date:  07/20/2023   ID:  Ashley Griffith, DOB Apr 15, 1946, MRN [id number]  PCP:  Lind Covert, MD   [facility] Providers Cardiologist:  Christell Constant, MD     Referring MD: Gweneth Dimitri, MD   CC: Aortic regurgitation  History of Present Illness:    Ashley Griffith is a 78 y.o. female with a hx of prior PE and DVT for Xarelto, Rare PVCs, and DOE who presents for evaluation. 2023: Able to go to the gym and walk with no symptoms. Lost 20 lbs. Had three falls. 2024:Recovered from falls and out of boot.  Ashley Griffith is a 78 year old female with moderate aortic regurgitation who presents for routine follow-up.  She has a history of moderate aortic regurgitation, initially identified as mild to moderate in 2024. She undergoes annual echocardiograms to monitor the condition and her symptoms (also for mild dilation). She experiences shortness of breath when climbing stairs, attributing it to being out of shape. No chest pain, pressure, palpitations, or irregular heartbeats are reported.  She has a history of orthostatic hypotension and hypertension, managed with low-risk therapy. Due to significant orthostatic hypotension, she avoids AV nodal blocking agents unless necessary.  She has a history of supraventricular tachycardia (SVT), premature atrial contractions (PAC), and premature ventricular contractions (PVC), which are asymptomatic. Treatment is avoided to prevent exacerbating orthostatic hypotension and fall risk.  In August, she underwent hernia surgery, during which an electrocardiogram (EKG) was performed, showing normal rhythm without premature contractions. The EKG was done due to pre-surgery anxiety, but no significant issues were found.  Past Medical History:  Diagnosis Date   Anxiety    Arthritis    Arthropathic    Blepharitis, right eye    Blood clot in vein    leg 11-23-1004   Depression    Depression    Dry eye  syndrome    DVT (deep venous thrombosis) (HCC) 1996   GAD (generalized anxiety disorder)    GERD (gastroesophageal reflux disease)    Graves disease    Hemorrhoids    Patient notes intermittent rectal bleeding from these   High cholesterol    Hypertension    Hypothyroidism    Seen by Dr. Juleen China   Insomnia    Migraine    Obesity    Status post significant intentional weight loss in 2003 of > 90 pounds   Osteoarthritis    Osteopenia    Peripheral neuropathy    Personal history of pulmonary embolism    Psoriatic arthritis (HCC)    Follows with Dr. Clarita Crane   Pulmonary embolism [facility]) 06/1993   Rosacea    Rosacea, unspecified    Sleep disturbance    Vitamin D deficiency     Past Surgical History:  Procedure Laterality Date   blood mass removed from abdomen  1972   ? questionable ectopic pregnancy   cataract surgery  2006 and 2007   childbirth  1976   colonscopy     x 2   INGUINAL HERNIA REPAIR Left 11/27/2022   Procedure: LAPAROSCOPIC  LEFT INGUINAL HERNIA REPAIR WITH MESH;  Surgeon: Axel Filler, MD;  Location: [facility] OR;  Service: General;  Laterality: Left;   KNEE ARTHROSCOPY Right 09/18/2012   Procedure: RIGHT ARTHROSCOPY KNEE, Tlateral meniscal debdridement and chondroplasty;  Surgeon: Loanne Drilling, MD;  Location: [facility]S;  Service: Orthopedics;  Laterality: Right;   LUMBAR LAMINECTOMY/DECOMPRESSION MICRODISCECTOMY Right 09/16/2015   Procedure: Right Lumbar Four-FiveLaminectomy  with resection of synovial cyst;  Surgeon: Maeola Harman, MD;  Location: [facility];  Service: Neurosurgery;  Laterality: Right;  right   nasal poly removed  1964   OPEN REDUCTION INTERNAL FIXATION (ORIF) METACARPAL Right 08/21/2018   Procedure: OPEN REDUCTION INTERNAL FIXATION (ORIF) AND REPAIR AS INDICATED  RIGHT 5TH METACARPAL;  Surgeon: Bradly Bienenstock, MD;  Location: [facility];  Service: Orthopedics;  Laterality: Right;    sclorotomy both legs veins  2005 or 2006    SPHINCTEROTOMY  1990's   superficial keratotomy  2021   TONSILLECTOMY  as child   torn meniscus right knee  2014   TRANSFORAMINAL LUMBAR INTERBODY FUSION (TLIF) WITH PEDICLE SCREW FIXATION 1 LEVEL Left 05/02/2018   Procedure: Left Lumbar four-five Transforaminal lumbar interbody fusion;  Surgeon: Maeola Harman, MD;  Location: [facility] OR;  Service: Neurosurgery;  Laterality: Left;  Left Lumbar four-five Transforaminal lumbar interbody fusion    Current Medications: Current Meds  Medication Sig   acetaminophen (TYLENOL) 650 MG CR tablet Take 1,300 mg by mouth in the morning and at bedtime.   atorvastatin (LIPITOR) 20 MG tablet Take 20 mg by mouth daily.   B Complex-C (B-COMPLEX WITH VITAMIN C) tablet Take 1 tablet by mouth daily.   Biotin 40981 MCG TABS Take 50,000 mcg by mouth daily.   buPROPion (WELLBUTRIN XL) 300 MG 24 hr tablet Take 300 mg by mouth daily.   calcium carbonate (TUMS) 500 MG chewable tablet as needed for heartburn or indigestion.   celecoxib (CELEBREX) 100 MG capsule Take 100 mg by mouth 2 (two) times daily.    desoximetasone (TOPICORT) 0.25 % cream Apply 1 Application topically as needed (ezema, rash).   diazepam (VALIUM) 2 MG tablet Take 2 mg by mouth as needed for anxiety.   folic acid (FOLVITE) 800 MCG tablet Take 2,400 mcg by mouth every morning.   gabapentin (NEURONTIN) 300 MG capsule Take 600 mg by mouth at bedtime.   G[place]elkumab (TREMFYA [place]) Inject 1 Dose into the skin every 8 (eight) weeks.   hydrochlorothiazide (MICROZIDE) 12.5 MG capsule TAKE 1 CAPSULE BY MOUTH EVERY DAY   levothyroxine (SYNTHROID) 50 MCG tablet daily at 6 (six) AM.   Omega-3 Fatty Acids (OMEGA-3 FISH OIL PO) Take 1,400 mg by mouth daily.    omeprazole (PRILOSEC) 20 MG capsule Take 20 mg by mouth daily.   rivaroxaban (XARELTO) 20 MG TABS tablet Take 1 tablet (20 mg total) by mouth daily with supper.   tacrolimus (PROTOPIC) 0.1 % ointment Apply 1 Application topically as needed (ecema).   TYMLOS 3120  MCG/1.56ML SOPN daily at 6 (six) AM.   Vitamin D, Ergocalciferol, (DRISDOL) 1.25 MG (50000 UT) CAPS capsule Take 50,000 Units by mouth every 7 (seven) days.   [DISCONTINUED] SYNTHROID 88 MCG tablet Take 88 mcg by mouth daily before breakfast.     Allergies:   Erythromycin, Penicillins, Thimerosal, Adhesive [tape], Buspirone hcl, Kenalog [triamcinolone acetonide], Monistat [miconazole], Secukinumab, Silicone, and Thimerosal (thiomersal)   Social History   Socioeconomic History   Marital status: Married    Spouse name: Not on file   Number of children: 1   Years of education: Not on file   Highest education level: Master's degree (e.g., MA, MS, MEng, MEd, MSW, MBA)  Occupational History   Not on file  Tobacco Use   Smoking status: Former    Current packs/day: 0.00    Average packs/day: 1 pack/day for 5.0 years (5.0 ttl pk-yrs)    Types: Cigarettes  Start date: 04/24/1968    Quit date: 04/24/1973    Years since quitting: 50.2   Smokeless tobacco: Never  Vaping Use   Vaping status: Never Used  Substance and Sexual Activity   Alcohol use: Yes    Alcohol/week: 1.0 standard drink of alcohol    Types: 1 Standard drinks or equivalent per week    Comment: social   Drug use: No   Sexual activity: Not on file  Other Topics Concern   Not on file  Social History Narrative   Lives at home with husband and daughter   Caffeine: about 10 oz coffee/day    Social Drivers of Corporate investment banker Strain: Not on file  Food Insecurity: Not on file  Transportation Needs: Not on file  Physical Activity: Not on file  Stress: Not on file  Social Connections: Not on file    Social: 2024: Husband diagnosed with Parkinson's.  Family History: The patient's family history includes Acute myelogenous leukemia in her brother; Arthritis in an other family member; Cancer in her maternal aunt; Depression in her father; Gallstones in her father; Heart Problems in her mother; Heart attack in her  father; Kidney failure in her mother; Obesity in an other family member; Ulcerative colitis in her mother; Varicose Veins in her mother.  ROS:   Please see the history of present illness.     EKGs/Labs/Other Studies Reviewed:    The following studies were reviewed today:  EKG:   02/10/22: SR with rare PACs and LAE  Cardiac Studies & Procedures   ______________________________________________________________________________________________     ECHOCARDIOGRAM  ECHOCARDIOGRAM COMPLETE 03/05/2023  Narrative ECHOCARDIOGRAM REPORT    Patient Name:   OLUWADAMILOLA DELIZ Trautner Date of Exam: 03/05/2023 Medical Rec #:  [id number]            Height:       64.5 in Accession #:    [id number]           Weight:       130.0 lb Date of Birth:  1945/08/24            BSA:          1.638 m Patient Age:    77 years             BP:           114/62 mmHg Patient Gender: F                    HR:           58 bpm. Exam Location:  [address]  Procedure: 2D Echo, Cardiac Doppler, Color Doppler and Strain Analysis  Indications:    I35.1 AI  History:        Patient has prior history of Echocardiogram examinations, most recent 02/24/2022. Dilated aortic root, AI, Arrythmias:PVC; Risk Factors:Former Smoker, Hypertension and Dyslipidemia.  Sonographer:    Samule Ohm RDCS Referring Phys: [id number] [facility] Nethan Caudillo  IMPRESSIONS   1. Left ventricular ejection fraction, by estimation, is 60 to 65%. The left ventricle has normal function. The left ventricle has no regional wall motion abnormalities. Left ventricular diastolic parameters are consistent with Grade I diastolic dysfunction (impaired relaxation). The average left ventricular global longitudinal strain is -19.3 %. The global longitudinal strain is normal. 2. Right ventricular systolic function is normal. The right ventricular size is normal. There is normal pulmonary artery systolic pressure. 3. The mitral valve is normal in structure.  Mild mitral valve  regurgitation. No evidence of mitral stenosis. 4. The aortic valve is tricuspid. Aortic valve regurgitation is mild to moderate. No aortic stenosis is present. 5. Aortic dilatation noted. There is mild dilatation of the ascending aorta, measuring 42 mm. 6. The inferior vena cava is normal in size with greater than 50% respiratory variability, suggesting right atrial pressure of 3 mmHg.  FINDINGS Left Ventricle: Left ventricular ejection fraction, by estimation, is 60 to 65%. The left ventricle has normal function. The left ventricle has no regional wall motion abnormalities. The average left ventricular global longitudinal strain is -19.3 %. The global longitudinal strain is normal. The left ventricular internal cavity size was normal in size. There is no left ventricular hypertrophy. Left ventricular diastolic parameters are consistent with Grade I diastolic dysfunction (impaired relaxation).  Right Ventricle: The right ventricular size is normal. No increase in right ventricular wall thickness. Right ventricular systolic function is normal. There is normal pulmonary artery systolic pressure. The tricuspid regurgitant velocity is 2.50 m/s, and with an assumed right atrial pressure of 3 mmHg, the estimated right ventricular systolic pressure is 28.0 mmHg.  Left Atrium: Left atrial size was normal in size.  Right Atrium: Right atrial size was normal in size.  Pericardium: There is no evidence of pericardial effusion.  Mitral Valve: The mitral valve is normal in structure. Mild mitral valve regurgitation. No evidence of mitral valve stenosis.  Tricuspid Valve: The tricuspid valve is normal in structure. Tricuspid valve regurgitation is trivial. No evidence of tricuspid stenosis.  Aortic Valve: The aortic valve is tricuspid. Aortic valve regurgitation is mild to moderate. Aortic regurgitation PHT measures 617 msec. No aortic stenosis is present.  Pulmonic Valve: The pulmonic  valve was normal in structure. Pulmonic valve regurgitation is mild to moderate. No evidence of pulmonic stenosis.  Aorta: Aortic dilatation noted. There is mild dilatation of the ascending aorta, measuring 42 mm.  Venous: The inferior vena cava is normal in size with greater than 50% respiratory variability, suggesting right atrial pressure of 3 mmHg.  IAS/Shunts: No atrial level shunt detected by color flow Doppler.   LEFT VENTRICLE PLAX 2D LVIDd:         4.20 cm   Diastology LVIDs:         2.80 cm   LV e' medial:    5.66 cm/s LV PW:         0.80 cm   LV E/e' medial:  9.5 LV IVS:        0.90 cm   LV e' lateral:   12.00 cm/s LVOT diam:     2.00 cm   LV E/e' lateral: 4.5 LV SV:         68 LV SV Index:   42        2D Longitudinal Strain LVOT Area:     3.14 cm  2D Strain GLS (A2C):   -17.4 % 2D Strain GLS (A3C):   -21.1 % 2D Strain GLS (A4C):   -19.5 % 2D Strain GLS Avg:     -19.3 %  RIGHT VENTRICLE             IVC RV S prime:     15.80 cm/s  IVC diam: 1.20 cm TAPSE (M-mode): 2.4 cm RVSP:           28.0 mmHg  LEFT ATRIUM              Index        RIGHT ATRIUM  Index LA diam:        3.50 cm  2.14 cm/m   RA Pressure: 3.00 mmHg LA Vol (A2C):   103.0 ml 62.89 ml/m  RA Area:     16.80 cm LA Vol (A4C):   46.7 ml  28.51 ml/m  RA Volume:   40.00 ml  24.42 ml/m LA Biplane Vol: 71.5 ml  43.66 ml/m AORTIC VALVE LVOT Vmax:   96.60 cm/s LVOT Vmean:  65.800 cm/s LVOT VTI:    0.217 m AI PHT:      617 msec  AORTA Ao Root diam: 3.20 cm Ao Asc diam:  4.20 cm  MITRAL VALVE               TRICUSPID VALVE MV Area (PHT): 2.22 cm    TR Peak grad:   25.0 mmHg MV Decel Time: 341 msec    TR Vmax:        250.00 cm/s MV E velocity: 53.50 cm/s  Estimated RAP:  3.00 mmHg MV A velocity: 74.90 cm/s  RVSP:           28.0 mmHg MV E/A ratio:  0.71 SHUNTS Systemic VTI:  0.22 m Systemic Diam: 2.00 cm  Chilton Si MD Electronically signed by Chilton Si MD Signature  Date/Time: 03/05/2023/3:35:34 PM    Final    MONITORS  LONG TERM MONITOR (3-14 DAYS) 03/21/2022  Narrative   Patient had a minimum heart rate of 49 bpm, maximum heart rate of 179 bpm, and average heart rate of 77 bpm.   Predominant underlying rhythm was sinus rhythm.   Paroxysms of regular, supraventricular tachycardia.   Longest episode 32 seconds.  Symptoms were not triggered on device during this.   Isolated PACs were rare (<1.0%).   Isolated PVCs were occasional (2%).   Triggered and diary events associated with sinus rhythm, sinus tachycardia, PACs, and PVCs.  Paroxsymal SVT without device trigger for symptoms.       ______________________________________________________________________________________________      Recent Labs: 11/22/2022: Hemoglobin 12.6; Platelets 247        Physical Exam:    VS:  BP 112/60 (BP Location: Right Arm)   Pulse 70   Ht 5\' 5"  (1.651 m)   Wt 65.1 kg   SpO2 96%   BMI 23.90 kg/m     Wt Readings from Last 3 Encounters:  07/20/23 65.1 kg  11/27/22 59 kg  11/22/22 60.1 kg    GEN:  Well nourished, well developed in no acute distress HEENT: Normal NECK: No JVD CARDIAC: RRR, no rubs, gallops, diastolic murmur RESPIRATORY:  Clear to auscultation without rales, wheezing or rhonchi  ABDOMEN: Soft, non-tender, non-distended MUSCULOSKELETAL:  No edema; No deformity  SKIN: Warm and dry NEUROLOGIC:  Alert and oriented x 3 PSYCHIATRIC:  Normal affect   ASSESSMENT:    1. Aortic dilatation (HCC)   2. Nonrheumatic aortic valve insufficiency   3. PVC (premature ventricular contraction)   4. Essential hypertension   5. Orthostatic hypotension     PLAN:    Aortic regurgitation Family history of SCD (Father) Mild to moderate aortic regurgitation with mild aortic dilation, stable since 2024. Regular echocardiograms to monitor progression. Potential interventions include TAVR with the Jenna valve or surgery if condition progresses to  severe. Intervention delayed to avoid premature bioprosthetic valve failure, which typically lasts 10-15 years. Percutaneous approaches preferred over open-heart surgery as she ages.  Husband has bioprosthetic valve surgery - Repeat echocardiogram in one year - Schedule follow-up appointment in one  year  Supraventricular tachycardia (SVT) and premature atrial contractions (PACs) SVT and asymptomatic PACs and PVCs managed conservatively due to lack of symptoms and risk of exacerbating orthostatic hypotension. Monitoring for symptom development or significant arrhythmias. Treatment considered if symptomatic or concerning heart rhythm develops.  Orthostatic hypotension Orthostatic hypotension with concurrent hypertension managed conservatively due to risk of falls and low blood pressure. Avoidance of AV nodal blocking agents unless symptomatic arrhythmias develop.  Hypertension - no change in therapy  Hyperlipidemia Well-controlled hyperlipidemia as of 2024.  Goals of Care Discussion of husband's recent Parkinson's diagnosis with associated dementia. Acknowledgment of challenges and potential need for respite care. Encouragement to discuss support options with neurologist and plan for future care needs due to progressive nature of the condition.        Medication Adjustments/Labs and Tests Ordered: Current medicines are reviewed at length with the patient today.  Concerns regarding medicines are outlined above.  No orders of the defined types were placed in this encounter.  No orders of the defined types were placed in this encounter.   There are no Patient Instructions on file for this visit.   Signed, Christell Constant, MD  07/20/2023 11:09 AM    [facility]

## 2023-07-20 NOTE — Patient Instructions (Signed)
 Medication Instructions:  Your physician recommends that you continue on your current medications as directed. Please refer to the Current Medication list given to you today.  *If you need a refill on your cardiac medications before your next appointment, please call your pharmacy*  Lab Work: NONE  If you have labs (blood work) drawn today and your tests are completely normal, you will receive your results only by: MyChart Message (if you have MyChart) OR A paper copy in the mail If you have any lab test that is abnormal or we need to change your treatment, we will call you to review the results.  Testing/Procedures: NOV- - - Your physician has requested that you have an echocardiogram. Echocardiography is a painless test that uses sound waves to create images of your heart. It provides your doctor with information about the size and shape of your heart and how well your heart's chambers and valves are working. This procedure takes approximately one hour. There are no restrictions for this procedure. Please do NOT wear cologne, perfume, aftershave, or lotions (deodorant is allowed). Please arrive 15 minutes prior to your appointment time.  Please note: We ask at that you not bring children with you during ultrasound (echo/ vascular) testing. Due to room size and safety concerns, children are not allowed in the ultrasound rooms during exams. Our front office staff cannot provide observation of children in our lobby area while testing is being conducted. An adult accompanying a patient to their appointment will only be allowed in the ultrasound room at the discretion of the ultrasound technician under special circumstances. We apologize for any inconvenience.   Follow-Up: At [facility], you and your health needs are our priority.  As part of our continuing mission to provide you with exceptional heart care, our providers are all part of one team.  This team includes your primary  Cardiologist (physician) and Advanced Practice Providers or APPs (Physician Assistants and Nurse Practitioners) who all work together to provide you with the care you need, when you need it.  Your next appointment:   12 month(s)  Provider:   Christell Constant, MD       Other Instructions       1st Floor: - Lobby - Registration  - Pharmacy  - Lab - Cafe  2nd Floor: - PV Lab - Diagnostic Testing (echo, CT, nuclear med)  3rd Floor: - Vacant  4th Floor: - TCTS (cardiothoracic surgery) - AFib Clinic - Structural Heart Clinic - Vascular Surgery  - Vascular Ultrasound  5th Floor: - [facility] (general and EP) - Clinical Pharmacy for coumadin, hypertension, lipid, weight-loss medications, and med management appointments    Valet parking services will be available as well.

## 2023-08-20 ENCOUNTER — Other Ambulatory Visit (HOSPITAL_BASED_OUTPATIENT_CLINIC_OR_DEPARTMENT_OTHER): Payer: Self-pay

## 2023-08-20 DIAGNOSIS — L4050 Arthropathic psoriasis, unspecified: Secondary | ICD-10-CM | POA: Diagnosis not present

## 2023-08-20 DIAGNOSIS — M1710 Unilateral primary osteoarthritis, unspecified knee: Secondary | ICD-10-CM | POA: Diagnosis not present

## 2023-08-20 DIAGNOSIS — L408 Other psoriasis: Secondary | ICD-10-CM | POA: Diagnosis not present

## 2023-08-20 DIAGNOSIS — Z111 Encounter for screening for respiratory tuberculosis: Secondary | ICD-10-CM | POA: Diagnosis not present

## 2023-08-20 DIAGNOSIS — M25531 Pain in right wrist: Secondary | ICD-10-CM | POA: Diagnosis not present

## 2023-08-20 DIAGNOSIS — Z79899 Other long term (current) drug therapy: Secondary | ICD-10-CM | POA: Diagnosis not present

## 2023-08-20 DIAGNOSIS — Z6823 Body mass index (BMI) 23.0-23.9, adult: Secondary | ICD-10-CM | POA: Diagnosis not present

## 2023-08-20 DIAGNOSIS — M542 Cervicalgia: Secondary | ICD-10-CM | POA: Diagnosis not present

## 2023-08-20 DIAGNOSIS — M8000XD Age-related osteoporosis with current pathological fracture, unspecified site, subsequent encounter for fracture with routine healing: Secondary | ICD-10-CM | POA: Diagnosis not present

## 2023-08-20 DIAGNOSIS — E559 Vitamin D deficiency, unspecified: Secondary | ICD-10-CM | POA: Diagnosis not present

## 2023-09-08 ENCOUNTER — Other Ambulatory Visit: Payer: Self-pay | Admitting: Nurse Practitioner

## 2023-10-05 DIAGNOSIS — Z961 Presence of intraocular lens: Secondary | ICD-10-CM | POA: Diagnosis not present

## 2023-10-05 DIAGNOSIS — H18522 Epithelial (juvenile) corneal dystrophy, left eye: Secondary | ICD-10-CM | POA: Diagnosis not present

## 2023-10-05 DIAGNOSIS — H0288B Meibomian gland dysfunction left eye, upper and lower eyelids: Secondary | ICD-10-CM | POA: Diagnosis not present

## 2023-10-05 DIAGNOSIS — Z9889 Other specified postprocedural states: Secondary | ICD-10-CM | POA: Diagnosis not present

## 2023-10-05 DIAGNOSIS — L718 Other rosacea: Secondary | ICD-10-CM | POA: Diagnosis not present

## 2023-10-05 DIAGNOSIS — H43811 Vitreous degeneration, right eye: Secondary | ICD-10-CM | POA: Diagnosis not present

## 2023-10-05 DIAGNOSIS — H0288A Meibomian gland dysfunction right eye, upper and lower eyelids: Secondary | ICD-10-CM | POA: Diagnosis not present

## 2023-10-18 DIAGNOSIS — J3489 Other specified disorders of nose and nasal sinuses: Secondary | ICD-10-CM | POA: Diagnosis not present

## 2023-10-18 DIAGNOSIS — I8390 Asymptomatic varicose veins of unspecified lower extremity: Secondary | ICD-10-CM | POA: Diagnosis not present

## 2023-10-18 DIAGNOSIS — R1310 Dysphagia, unspecified: Secondary | ICD-10-CM | POA: Diagnosis not present

## 2023-10-18 DIAGNOSIS — E039 Hypothyroidism, unspecified: Secondary | ICD-10-CM | POA: Diagnosis not present

## 2023-10-18 DIAGNOSIS — S22070S Wedge compression fracture of T9-T10 vertebra, sequela: Secondary | ICD-10-CM | POA: Diagnosis not present

## 2023-10-18 DIAGNOSIS — F5101 Primary insomnia: Secondary | ICD-10-CM | POA: Diagnosis not present

## 2023-10-22 DIAGNOSIS — F411 Generalized anxiety disorder: Secondary | ICD-10-CM | POA: Diagnosis not present

## 2023-10-22 DIAGNOSIS — L4050 Arthropathic psoriasis, unspecified: Secondary | ICD-10-CM | POA: Diagnosis not present

## 2023-10-22 DIAGNOSIS — E7800 Pure hypercholesterolemia, unspecified: Secondary | ICD-10-CM | POA: Diagnosis not present

## 2023-10-22 DIAGNOSIS — F321 Major depressive disorder, single episode, moderate: Secondary | ICD-10-CM | POA: Diagnosis not present

## 2023-10-29 DIAGNOSIS — Z961 Presence of intraocular lens: Secondary | ICD-10-CM | POA: Diagnosis not present

## 2023-10-29 DIAGNOSIS — H0288A Meibomian gland dysfunction right eye, upper and lower eyelids: Secondary | ICD-10-CM | POA: Diagnosis not present

## 2023-10-29 DIAGNOSIS — Z9889 Other specified postprocedural states: Secondary | ICD-10-CM | POA: Diagnosis not present

## 2023-10-29 DIAGNOSIS — H43811 Vitreous degeneration, right eye: Secondary | ICD-10-CM | POA: Diagnosis not present

## 2023-10-29 DIAGNOSIS — H18522 Epithelial (juvenile) corneal dystrophy, left eye: Secondary | ICD-10-CM | POA: Diagnosis not present

## 2023-10-29 DIAGNOSIS — L718 Other rosacea: Secondary | ICD-10-CM | POA: Diagnosis not present

## 2023-10-29 DIAGNOSIS — H0288B Meibomian gland dysfunction left eye, upper and lower eyelids: Secondary | ICD-10-CM | POA: Diagnosis not present

## 2023-11-02 DIAGNOSIS — Z961 Presence of intraocular lens: Secondary | ICD-10-CM | POA: Diagnosis not present

## 2023-11-02 DIAGNOSIS — H0288B Meibomian gland dysfunction left eye, upper and lower eyelids: Secondary | ICD-10-CM | POA: Diagnosis not present

## 2023-11-02 DIAGNOSIS — Z9889 Other specified postprocedural states: Secondary | ICD-10-CM | POA: Diagnosis not present

## 2023-11-02 DIAGNOSIS — H43811 Vitreous degeneration, right eye: Secondary | ICD-10-CM | POA: Diagnosis not present

## 2023-11-02 DIAGNOSIS — L718 Other rosacea: Secondary | ICD-10-CM | POA: Diagnosis not present

## 2023-11-02 DIAGNOSIS — H0288A Meibomian gland dysfunction right eye, upper and lower eyelids: Secondary | ICD-10-CM | POA: Diagnosis not present

## 2023-11-08 DIAGNOSIS — E039 Hypothyroidism, unspecified: Secondary | ICD-10-CM | POA: Diagnosis not present

## 2023-11-22 DIAGNOSIS — L4050 Arthropathic psoriasis, unspecified: Secondary | ICD-10-CM | POA: Diagnosis not present

## 2023-11-22 DIAGNOSIS — F321 Major depressive disorder, single episode, moderate: Secondary | ICD-10-CM | POA: Diagnosis not present

## 2023-11-22 DIAGNOSIS — F411 Generalized anxiety disorder: Secondary | ICD-10-CM | POA: Diagnosis not present

## 2023-11-22 DIAGNOSIS — E7800 Pure hypercholesterolemia, unspecified: Secondary | ICD-10-CM | POA: Diagnosis not present

## 2023-12-05 DIAGNOSIS — H0288B Meibomian gland dysfunction left eye, upper and lower eyelids: Secondary | ICD-10-CM | POA: Diagnosis not present

## 2023-12-05 DIAGNOSIS — L718 Other rosacea: Secondary | ICD-10-CM | POA: Diagnosis not present

## 2023-12-05 DIAGNOSIS — H43811 Vitreous degeneration, right eye: Secondary | ICD-10-CM | POA: Diagnosis not present

## 2023-12-05 DIAGNOSIS — Z961 Presence of intraocular lens: Secondary | ICD-10-CM | POA: Diagnosis not present

## 2023-12-05 DIAGNOSIS — Z9889 Other specified postprocedural states: Secondary | ICD-10-CM | POA: Diagnosis not present

## 2023-12-05 DIAGNOSIS — H0288A Meibomian gland dysfunction right eye, upper and lower eyelids: Secondary | ICD-10-CM | POA: Diagnosis not present

## 2023-12-13 ENCOUNTER — Other Ambulatory Visit: Payer: Self-pay

## 2023-12-13 DIAGNOSIS — I8390 Asymptomatic varicose veins of unspecified lower extremity: Secondary | ICD-10-CM

## 2023-12-25 DIAGNOSIS — Z1231 Encounter for screening mammogram for malignant neoplasm of breast: Secondary | ICD-10-CM | POA: Diagnosis not present

## 2023-12-28 DIAGNOSIS — R928 Other abnormal and inconclusive findings on diagnostic imaging of breast: Secondary | ICD-10-CM | POA: Diagnosis not present

## 2023-12-28 DIAGNOSIS — N6489 Other specified disorders of breast: Secondary | ICD-10-CM | POA: Diagnosis not present

## 2024-01-07 DIAGNOSIS — M170 Bilateral primary osteoarthritis of knee: Secondary | ICD-10-CM | POA: Diagnosis not present

## 2024-01-10 ENCOUNTER — Other Ambulatory Visit (HOSPITAL_BASED_OUTPATIENT_CLINIC_OR_DEPARTMENT_OTHER): Payer: Self-pay

## 2024-01-10 MED ORDER — FLUZONE HIGH-DOSE 0.5 ML IM SUSY
0.5000 mL | PREFILLED_SYRINGE | Freq: Once | INTRAMUSCULAR | 0 refills | Status: AC
  Filled 2024-01-10: qty 0.5, 1d supply, fill #0

## 2024-01-10 MED ORDER — COMIRNATY 30 MCG/0.3ML IM SUSY
0.3000 mL | PREFILLED_SYRINGE | Freq: Once | INTRAMUSCULAR | 0 refills | Status: AC
  Filled 2024-01-10: qty 0.3, 1d supply, fill #0

## 2024-01-14 DIAGNOSIS — M170 Bilateral primary osteoarthritis of knee: Secondary | ICD-10-CM | POA: Diagnosis not present

## 2024-01-21 DIAGNOSIS — M170 Bilateral primary osteoarthritis of knee: Secondary | ICD-10-CM | POA: Diagnosis not present

## 2024-01-22 ENCOUNTER — Ambulatory Visit (HOSPITAL_COMMUNITY)
Admission: RE | Admit: 2024-01-22 | Discharge: 2024-01-22 | Disposition: A | Discharge status: Home / Self Care | Source: Ambulatory Visit | Attending: Vascular Surgery | Admitting: Vascular Surgery

## 2024-01-22 ENCOUNTER — Ambulatory Visit: Admitting: Vascular Surgery

## 2024-01-22 ENCOUNTER — Encounter: Payer: Self-pay | Admitting: Vascular Surgery

## 2024-01-22 VITALS — BP 166/95 | HR 63 | Temp 97.6°F | Resp 18 | Ht 65.0 in | Wt 145.2 lb

## 2024-01-22 DIAGNOSIS — I8390 Asymptomatic varicose veins of unspecified lower extremity: Secondary | ICD-10-CM | POA: Diagnosis not present

## 2024-01-22 DIAGNOSIS — I872 Venous insufficiency (chronic) (peripheral): Secondary | ICD-10-CM | POA: Diagnosis not present

## 2024-01-22 DIAGNOSIS — Z0000 Encounter for general adult medical examination without abnormal findings: Secondary | ICD-10-CM | POA: Diagnosis not present

## 2024-01-22 DIAGNOSIS — I8392 Asymptomatic varicose veins of left lower extremity: Secondary | ICD-10-CM | POA: Diagnosis not present

## 2024-01-22 NOTE — Progress Notes (Signed)
 Patient name: Ashley Griffith MRN: [id number] DOB: February 20, 1946 Sex: female  REASON FOR CONSULT: Asymptomatic varicose veins  HPI: Ashley Griffith is a 78 y.o. female, with history of DVT PE on Xarelto , depression, anxiety that presents for evaluation of varicose veins.  States that her she is worried about a prominent area on her left leg by the ankle and her mom had a bleeding varicose vein in the same area.  Not really having any significant pain.  States she had what she thinks with sclerotherapy about 10 or 11 years ago at a vein center here in [place].  Has compression stockings and does not like wearing them.  Past Medical History:  Diagnosis Date   Anxiety    Arthritis    Arthropathic    Blepharitis, right eye    Blood clot in vein    leg 11-23-1004   Depression    Depression    Dry eye syndrome    DVT (deep venous thrombosis) (HCC) 1996   GAD (generalized anxiety disorder)    GERD (gastroesophageal reflux disease)    Graves disease    Hemorrhoids    Patient notes intermittent rectal bleeding from these   High cholesterol    Hypertension    Hypothyroidism    Seen by Dr. Loetta   Insomnia    Migraine    Obesity    Status post significant intentional weight loss in 2003 of > 90 pounds   Osteoarthritis    Osteopenia    Peripheral neuropathy    Peripheral vascular disease    Personal history of pulmonary embolism    Psoriatic arthritis (HCC)    Follows with Dr. Larry   Pulmonary embolism [facility]) 06/1993   Rosacea    Rosacea, unspecified    Sleep disturbance    Vitamin D  deficiency     Past Surgical History:  Procedure Laterality Date   blood mass removed from abdomen  1972   ? questionable ectopic pregnancy   cataract surgery  2006 and 2007   childbirth  1976   colonscopy     x 2   INGUINAL HERNIA REPAIR Left 11/27/2022   Procedure: LAPAROSCOPIC  LEFT INGUINAL HERNIA REPAIR WITH MESH;  Surgeon: Rubin Calamity, MD;  Location: [facility] OR;  Service:  General;  Laterality: Left;   KNEE ARTHROSCOPY Right 09/18/2012   Procedure: RIGHT ARTHROSCOPY KNEE, Tlateral meniscal debdridement and chondroplasty;  Surgeon: Dempsey LULLA Moan, MD;  Location: [facility] ORS;  Service: Orthopedics;  Laterality: Right;   LUMBAR LAMINECTOMY/DECOMPRESSION MICRODISCECTOMY Right 09/16/2015   Procedure: Right Lumbar Four-FiveLaminectomy with resection of synovial cyst;  Surgeon: Fairy Levels, MD;  Location: [facility];  Service: Neurosurgery;  Laterality: Right;  right   nasal poly removed  1964   OPEN REDUCTION INTERNAL FIXATION (ORIF) METACARPAL Right 08/21/2018   Procedure: OPEN REDUCTION INTERNAL FIXATION (ORIF) AND REPAIR AS INDICATED  RIGHT 5TH METACARPAL;  Surgeon: Shari Easter, MD;  Location: [facility];  Service: Orthopedics;  Laterality: Right;    sclorotomy both legs veins  2005 or 2006   SPHINCTEROTOMY  1990's   superficial keratotomy  2021   TONSILLECTOMY  as child   torn meniscus right knee  2014   TRANSFORAMINAL LUMBAR INTERBODY FUSION (TLIF) WITH PEDICLE SCREW FIXATION 1 LEVEL Left 05/02/2018   Procedure: Left Lumbar four-five Transforaminal lumbar interbody fusion;  Surgeon: Levels Fairy, MD;  Location: [facility] OR;  Service: Neurosurgery;  Laterality: Left;  Left Lumbar four-five Transforaminal lumbar interbody fusion  Family History  Problem Relation Age of Onset   Kidney failure Mother        At age 69   Heart Problems Mother    Ulcerative colitis Mother    Varicose Veins Mother    Heart attack Father        At age 77   Gallstones Father    Depression Father    Acute myelogenous leukemia Brother        At age 67   Cancer Maternal Aunt        Breast cancer   Arthritis Other        everyone   Obesity Other        many    SOCIAL HISTORY: Social History   Socioeconomic History   Marital status: Married    Spouse name: Not on file   Number of children: 1   Years of education: Not on file   Highest education level:  Master's degree (e.g., MA, MS, MEng, MEd, MSW, MBA)  Occupational History   Not on file  Tobacco Use   Smoking status: Former    Current packs/day: 0.00    Average packs/day: 1 pack/day for 5.0 years (5.0 ttl pk-yrs)    Types: Cigarettes    Start date: 04/24/1968    Quit date: 04/24/1973    Years since quitting: 50.7   Smokeless tobacco: Never  Vaping Use   Vaping status: Never Used  Substance and Sexual Activity   Alcohol use: Yes    Alcohol/week: 1.0 standard drink of alcohol    Types: 1 Standard drinks or equivalent per week    Comment: social   Drug use: No   Sexual activity: Not on file  Other Topics Concern   Not on file  Social History Narrative   Lives at home with husband and daughter   Caffeine: about 10 oz coffee/day    Social Drivers of Corporate investment banker Strain: Not on file  Food Insecurity: Not on file  Transportation Needs: Not on file  Physical Activity: Not on file  Stress: Not on file  Social Connections: Not on file  Intimate Partner Violence: Not on file    Allergies  Allergen Reactions   Erythromycin Hives and Nausea And Vomiting   Penicillins Swelling, Rash and Other (See Comments)    PATIENT HAS HAD A PCN REACTION WITH IMMEDIATE RASH, FACIAL/TONGUE/THROAT SWELLING, SOB, OR LIGHTHEADEDNESS WITH HYPOTENSION:  #  #  YES  #  #  Has patient had a PCN reaction causing severe rash involving mucus membranes or skin necrosis: No Has patient had a PCN reaction that required hospitalization No Has patient had a PCN reaction occurring within the last 10 years: No If all of the above answers are NO, then may proceed with Cephalosporin use. 02/05/2020 pt states she does not recall swelling   Thimerosal     Other Reaction(s): turned eyes red and blurry   Adhesive [Tape] Rash   Buspirone Hcl Rash   Kenalog  [Triamcinolone  Acetonide] Other (See Comments)    Facial flush   Monistat [Miconazole] Itching and Other (See Comments)    Burning    Secukinumab Itching   Silicone Rash   Thimerosal (Thiomersal) Itching and Other (See Comments)    Red and blurry eyes    Current Outpatient Medications  Medication Sig Dispense Refill   acetaminophen  (TYLENOL ) 650 MG CR tablet Take 1,300 mg by mouth in the morning and at bedtime.     atorvastatin  (LIPITOR) 20  MG tablet Take 20 mg by mouth daily.     B Complex-C (B-COMPLEX WITH VITAMIN C) tablet Take 1 tablet by mouth daily.     Biotin  10000 MCG TABS Take 50,000 mcg by mouth daily.     buPROPion  (WELLBUTRIN  XL) 300 MG 24 hr tablet Take 300 mg by mouth daily.     calcium  carbonate (TUMS) 500 MG chewable tablet as needed for heartburn or indigestion.     celecoxib  (CELEBREX ) 100 MG capsule Take 100 mg by mouth 2 (two) times daily.      desoximetasone (TOPICORT) 0.25 % cream Apply 1 Application topically as needed (ezema, rash).     diazepam  (VALIUM ) 2 MG tablet Take 2 mg by mouth as needed for anxiety.     folic acid  (FOLVITE ) 800 MCG tablet Take 2,400 mcg by mouth every morning.     gabapentin  (NEURONTIN ) 300 MG capsule Take 600 mg by mouth at bedtime.     Guselkumab (TREMFYA [place]) Inject 1 Dose into the skin every 8 (eight) weeks.     hydrochlorothiazide  (MICROZIDE ) 12.5 MG capsule TAKE 1 CAPSULE BY MOUTH EVERY DAY 90 capsule 3   levothyroxine  (SYNTHROID ) 75 MCG tablet Take 75 mcg by mouth daily before breakfast.     Omega-3 Fatty Acids (OMEGA-3 FISH OIL PO) Take 1,400 mg by mouth daily.      omeprazole (PRILOSEC) 20 MG capsule Take 20 mg by mouth daily.     rivaroxaban  (XARELTO ) 20 MG TABS tablet Take 1 tablet (20 mg total) by mouth daily with supper.     tacrolimus (PROTOPIC) 0.1 % ointment Apply 1 Application topically as needed (ecema).     TYMLOS 3120 MCG/1.56ML SOPN daily at 6 (six) AM.     Vitamin D , Ergocalciferol , (DRISDOL ) 1.25 MG (50000 UT) CAPS capsule Take 50,000 Units by mouth every 7 (seven) days.     No current facility-administered medications for this visit.    REVIEW  OF SYSTEMS:  [X]  denotes positive finding, [ ]  denotes negative finding Cardiac  Comments:  Chest pain or chest pressure:    Shortness of breath upon exertion:    Short of breath when lying flat:    Irregular heart rhythm:        Vascular    Pain in calf, thigh, or hip brought on by ambulation:    Pain in feet at night that wakes you up from your sleep:     Blood clot in your veins:    Leg swelling:         Pulmonary    Oxygen at home:    Productive cough:     Wheezing:         Neurologic    Sudden weakness in arms or legs:     Sudden numbness in arms or legs:     Sudden onset of difficulty speaking or slurred speech:    Temporary loss of vision in one eye:     Problems with dizziness:         Gastrointestinal    Blood in stool:     Vomited blood:         Genitourinary    Burning when urinating:     Blood in urine:        Psychiatric    Major depression:         Hematologic    Bleeding problems:    Problems with blood clotting too easily:        Skin  Rashes or ulcers:        Constitutional    Fever or chills:      PHYSICAL EXAM: Vitals:   01/22/24 1540  BP: (!) 166/95  Pulse: 63  Resp: 18  Temp: 97.6 F (36.4 C)  TempSrc: Temporal  SpO2: 97%  Weight: 145 lb 3.2 oz (65.9 kg)  Height: 5' 5 (1.651 m)    GENERAL: The patient is a well-nourished female, in no acute distress. The vital signs are documented above. CARDIAC: There is a regular rate and rhythm.  VASCULAR:  Palpable femoral pulses bilaterally Palpable DP PT pulses bilaterally Left leg varicosities below the knee Reticular and spider veins at left ankle PULMONARY: No respiratory distress ABDOMEN: Soft and non-tender. MUSCULOSKELETAL: There are no major deformities or cyanosis. NEUROLOGIC: No focal weakness or paresthesias are detected. PSYCHIATRIC: The patient has a normal affect.  DATA:   Lower Venous Reflux Study   Patient Name:  Ashley Griffith  Date of Exam:   01/22/2024   Medical Rec #: [id number]             Accession #:    [id number]  Date of Birth: 17-Jan-1946             Patient Gender: F  Patient Age:   38 years  Exam Location:  [address]  Procedure:      VAS US  LOWER EXTREMITY VENOUS REFLUX  Referring Phys: LONNI GASKINS    ---------------------------------------------------------------------------  -----    Indications: History of varicose veins with venous insuffiencey. Patient  reports worsening varicosities with tenderness in both legs, left worse  than right.    Risk Factors: Surgery to the left lower extremity; possible venous  ablation in the left great saphenous vein in 2008.  Performing Technologist: Nanetta Shad RVT     Examination Guidelines: A complete evaluation includes B-mode imaging,  spectral  Doppler, color Doppler, and power Doppler as needed of all accessible  portions  of each vessel. Bilateral testing is considered an integral part of a  complete  examination. Limited examinations for reoccurring indications may be  performed  as noted. The reflux portion of the exam is performed with the patient in  reverse Trendelenburg.  Significant venous reflux is defined as >500 ms in the superficial venous  system, and >1 second in the deep venous system.     Venous Reflux Times  +---------------------------+---------+------+-----------+------------+----  ----+  RIGHT                     Reflux NoRefluxReflux TimeDiameter  cmsComments                                      Yes                                    +---------------------------+---------+------+-----------+------------+----  ----+  Giacomini                                                                  +---------------------------+---------+------+-----------+------------+----  ----+  AASV prox thigh                                                              +---------------------------+---------+------+-----------+------------+----  ----+  PASV prox thigh                                                             +---------------------------+---------+------+-----------+------------+----  ----+  Varicosities mid/distal                                                     calf                                                                        +---------------------------+---------+------+-----------+------------+----  ----+  Varicosities medial ankle                                                   +---------------------------+---------+------+-----------+------------+----  ----+     +-------------------+---------+------+----------+------------+-------------  ----+  LEFT              Reflux NoReflux  Reflux  Diameter cmsComments                                         Yes     Time                                   +-------------------+---------+------+----------+------------+-------------  ----+  CFV               no                                                       +-------------------+---------+------+----------+------------+-------------  ----+  FV prox            no                                                       +-------------------+---------+------+----------+------------+-------------  ----+  FV mid             no                                                       +-------------------+---------+------+----------+------------+-------------  ----+  Popliteal         no                                                       +-------------------+---------+------+----------+------------+-------------  ----+  GSV at [facility]         no                           .69                         +-------------------+---------+------+----------+------------+-------------  ----+  GSV prox thigh     no                           .52                          +-------------------+---------+------+----------+------------+-------------  ----+  GSV mid thigh                                           prior                                                                        ablation/strippin                                                         g                   +-------------------+---------+------+----------+------------+-------------  ----+  GSV dist thigh                                          prior                                                                        ablation/strippin                                                         g                   +-------------------+---------+------+----------+------------+-------------  ----+  GSV at knee                  yes   >500 ms      .35                         +-------------------+---------+------+----------+------------+-------------  ----+  GSV prox calf  yes   >500 ms      .33                         +-------------------+---------+------+----------+------------+-------------  ----+  GSV mid calf                 yes   >500 ms      .32                         +-------------------+---------+------+----------+------------+-------------  ----+  GSV dist calf                yes   >500 ms      .47     mid/distal                                                                  segment             +-------------------+---------+------+----------+------------+-------------  ----+  Giacomini         no                           .28                         +-------------------+---------+------+----------+------------+-------------  ----+  SSV prox calf      no                           .30                         +-------------------+---------+------+----------+------------+-------------  ----+  SSV mid calf       no                            .26                         +-------------------+---------+------+----------+------------+-------------  ----+  AASV prox thigh    no                           .36                         +-------------------+---------+------+----------+------------+-------------  ----+  PASV prox thigh                                 .47                         +-------------------+---------+------+----------+------------+-------------  ----+  Varicosities                yes   >500 ms      .42                         mid/distal calf                                                             +-------------------+---------+------+----------+------------+-------------  ----+  Varicosities medial                             .26                         ankle                                                                       +-------------------+---------+------+----------+------------+-------------  ----+   Summary:  Left:  - No evidence of deep vein thrombosis seen in the left lower extremity,  from the common femoral through the popliteal veins.  - No evidence of superficial venous thrombosis in the left lower  extremity.  - Venous reflux is noted in the left greater saphenous vein in the knee  and calf.  - Venous reflux is noted in the varicosities at level of the mid/distal  calf.    *See table(s) above for measurements and observations.   Electronically signed by Lonni Gaskins MD on 01/22/2024 at 4:01:02 PM.      Assessment/Plan:   78 y.o. female, with history of DVT PE on Xarelto , depression, anxiety that presents for evaluation of varicose veins.  States that her she is worried about a prominent area on her left leg by the ankle and her mom had a bleeding varicose vein in the same area.   Her reflux study does confirmed reflux in the left leg particularly below the knee.  Discussed the etiology for chronic venous insufficiency with  valvular reflux.  She is not having any significant symptoms and I do not think she warrants any intervention at this time.  I discussed conservative therapy with elevation, exercise, compression and maintaining a healthy weight.  The area of concern by her left ankle would best be served with sclerotherapy but this is purely elective as we discussed.  She has had no bleeding events.  She can follow-up with me as needed.   Lonni DOROTHA Gaskins, MD [facility] Office: [phone]

## 2024-01-29 DIAGNOSIS — E559 Vitamin D deficiency, unspecified: Secondary | ICD-10-CM | POA: Diagnosis not present

## 2024-01-29 DIAGNOSIS — Z79899 Other long term (current) drug therapy: Secondary | ICD-10-CM | POA: Diagnosis not present

## 2024-01-29 DIAGNOSIS — Z6823 Body mass index (BMI) 23.0-23.9, adult: Secondary | ICD-10-CM | POA: Diagnosis not present

## 2024-01-29 DIAGNOSIS — Z111 Encounter for screening for respiratory tuberculosis: Secondary | ICD-10-CM | POA: Diagnosis not present

## 2024-01-29 DIAGNOSIS — L4050 Arthropathic psoriasis, unspecified: Secondary | ICD-10-CM | POA: Diagnosis not present

## 2024-01-29 DIAGNOSIS — M8000XD Age-related osteoporosis with current pathological fracture, unspecified site, subsequent encounter for fracture with routine healing: Secondary | ICD-10-CM | POA: Diagnosis not present

## 2024-01-29 DIAGNOSIS — M542 Cervicalgia: Secondary | ICD-10-CM | POA: Diagnosis not present

## 2024-01-29 DIAGNOSIS — L408 Other psoriasis: Secondary | ICD-10-CM | POA: Diagnosis not present

## 2024-01-29 DIAGNOSIS — M25531 Pain in right wrist: Secondary | ICD-10-CM | POA: Diagnosis not present

## 2024-01-29 DIAGNOSIS — M1710 Unilateral primary osteoarthritis, unspecified knee: Secondary | ICD-10-CM | POA: Diagnosis not present

## 2024-02-01 ENCOUNTER — Other Ambulatory Visit (HOSPITAL_BASED_OUTPATIENT_CLINIC_OR_DEPARTMENT_OTHER): Payer: Self-pay

## 2024-02-01 MED ORDER — CAPVAXIVE 0.5 ML IM SOSY: 0.5000 mL | PREFILLED_SYRINGE | Freq: Once | INTRAMUSCULAR | 0 refills | Status: AC

## 2024-02-27 ENCOUNTER — Ambulatory Visit: Payer: Self-pay | Admitting: Internal Medicine

## 2024-02-27 ENCOUNTER — Ambulatory Visit (HOSPITAL_COMMUNITY)
Admission: RE | Admit: 2024-02-27 | Discharge: 2024-02-27 | Disposition: A | Discharge status: Home / Self Care | Source: Ambulatory Visit | Attending: Cardiology | Admitting: Cardiology

## 2024-02-27 DIAGNOSIS — I77819 Aortic ectasia, unspecified site: Secondary | ICD-10-CM | POA: Diagnosis not present

## 2024-02-27 DIAGNOSIS — I351 Nonrheumatic aortic (valve) insufficiency: Secondary | ICD-10-CM | POA: Diagnosis not present

## 2024-02-27 DIAGNOSIS — I10 Essential (primary) hypertension: Secondary | ICD-10-CM

## 2024-02-27 DIAGNOSIS — R6889 Other general symptoms and signs: Secondary | ICD-10-CM

## 2024-02-27 DIAGNOSIS — I429 Cardiomyopathy, unspecified: Secondary | ICD-10-CM

## 2024-02-27 LAB — ECHOCARDIOGRAM COMPLETE
Area-P 1/2: 4.63 cm2
P 1/2 time: 396 ms
S' Lateral: 3.2 cm

## 2024-03-05 DIAGNOSIS — Z961 Presence of intraocular lens: Secondary | ICD-10-CM | POA: Diagnosis not present

## 2024-03-05 DIAGNOSIS — H43811 Vitreous degeneration, right eye: Secondary | ICD-10-CM | POA: Diagnosis not present

## 2024-03-05 DIAGNOSIS — H0288B Meibomian gland dysfunction left eye, upper and lower eyelids: Secondary | ICD-10-CM | POA: Diagnosis not present

## 2024-03-05 DIAGNOSIS — H0288A Meibomian gland dysfunction right eye, upper and lower eyelids: Secondary | ICD-10-CM | POA: Diagnosis not present

## 2024-03-05 DIAGNOSIS — Z9889 Other specified postprocedural states: Secondary | ICD-10-CM | POA: Diagnosis not present

## 2024-03-05 DIAGNOSIS — L718 Other rosacea: Secondary | ICD-10-CM | POA: Diagnosis not present

## 2024-03-11 DIAGNOSIS — R350 Frequency of micturition: Secondary | ICD-10-CM | POA: Diagnosis not present

## 2024-03-11 DIAGNOSIS — F321 Major depressive disorder, single episode, moderate: Secondary | ICD-10-CM | POA: Diagnosis not present

## 2024-03-11 DIAGNOSIS — I10 Essential (primary) hypertension: Secondary | ICD-10-CM | POA: Diagnosis not present

## 2024-03-11 DIAGNOSIS — Z6824 Body mass index (BMI) 24.0-24.9, adult: Secondary | ICD-10-CM | POA: Diagnosis not present

## 2024-03-11 DIAGNOSIS — G479 Sleep disorder, unspecified: Secondary | ICD-10-CM | POA: Diagnosis not present

## 2024-03-11 DIAGNOSIS — K5901 Slow transit constipation: Secondary | ICD-10-CM | POA: Diagnosis not present

## 2024-03-17 DIAGNOSIS — I351 Nonrheumatic aortic (valve) insufficiency: Secondary | ICD-10-CM | POA: Diagnosis not present

## 2024-03-17 DIAGNOSIS — I77819 Aortic ectasia, unspecified site: Secondary | ICD-10-CM | POA: Diagnosis not present

## 2024-03-17 LAB — CBC
Hematocrit: 38.7 % (ref 34.0–46.6)
Hemoglobin: 13 g/dL (ref 11.1–15.9)
MCH: 33.4 pg — ABNORMAL HIGH (ref 26.6–33.0)
MCHC: 33.6 g/dL (ref 31.5–35.7)
MCV: 100 fL — ABNORMAL HIGH (ref 79–97)
Platelets: 244 x10E3/uL (ref 150–450)
RBC: 3.89 x10E6/uL (ref 3.77–5.28)
RDW: 11.6 % — ABNORMAL LOW (ref 11.7–15.4)
WBC: 5.9 x10E3/uL (ref 3.4–10.8)

## 2024-03-19 ENCOUNTER — Telehealth: Payer: Self-pay | Admitting: Internal Medicine

## 2024-03-19 MED ORDER — LORAZEPAM 0.5 MG PO TABS: ORAL_TABLET | ORAL | 0 refills | Status: AC

## 2024-03-19 NOTE — Telephone Encounter (Signed)
 Pt is have MR on 03/21/2024 and needs something to take because she is claustrophobia.

## 2024-03-19 NOTE — Telephone Encounter (Signed)
 Per dr santo, script phoned to [facility].

## 2024-03-21 ENCOUNTER — Other Ambulatory Visit: Payer: Self-pay | Admitting: Internal Medicine

## 2024-03-21 ENCOUNTER — Ambulatory Visit (HOSPITAL_COMMUNITY)
Admission: RE | Admit: 2024-03-21 | Discharge: 2024-03-21 | Disposition: A | Source: Ambulatory Visit | Attending: Internal Medicine

## 2024-03-21 ENCOUNTER — Ambulatory Visit (HOSPITAL_COMMUNITY)

## 2024-03-21 DIAGNOSIS — I77819 Aortic ectasia, unspecified site: Secondary | ICD-10-CM | POA: Insufficient documentation

## 2024-03-21 DIAGNOSIS — I351 Nonrheumatic aortic (valve) insufficiency: Secondary | ICD-10-CM

## 2024-03-21 MED ORDER — GADOBUTROL 1 MMOL/ML IV SOLN
9.5000 mL | Freq: Once | INTRAVENOUS | Status: AC | PRN
  Administered 2024-03-21: 9.5 mL via INTRAVENOUS

## 2024-03-26 MED ORDER — METOPROLOL TARTRATE 100 MG PO TABS: ORAL_TABLET | ORAL | 0 refills | Status: AC

## 2024-04-15 ENCOUNTER — Telehealth (HOSPITAL_COMMUNITY): Payer: Self-pay | Admitting: *Deleted

## 2024-04-15 NOTE — Telephone Encounter (Signed)
 Reaching out to patient to offer assistance regarding upcoming cardiac imaging study; pt verbalizes understanding of appt date/time, parking situation and where to check in, pre-test NPO status and medications ordered, and verified current allergies; name and call back number provided for further questions should they arise Sid Seats RN Navigator Cardiac Imaging [facility] [phone] office [phone] cell

## 2024-04-16 ENCOUNTER — Ambulatory Visit (HOSPITAL_COMMUNITY)
Admission: RE | Admit: 2024-04-16 | Discharge: 2024-04-16 | Disposition: A | Discharge status: Home / Self Care | Source: Ambulatory Visit | Attending: Cardiovascular Disease | Admitting: Cardiovascular Disease

## 2024-04-16 DIAGNOSIS — I429 Cardiomyopathy, unspecified: Secondary | ICD-10-CM | POA: Insufficient documentation

## 2024-04-16 MED ORDER — NITROGLYCERIN 0.4 MG SL SUBL
0.8000 mg | SUBLINGUAL_TABLET | Freq: Once | SUBLINGUAL | Status: AC
  Administered 2024-04-16: 0.8 mg via SUBLINGUAL

## 2024-04-16 MED ORDER — IOHEXOL 350 MG/ML SOLN
100.0000 mL | Freq: Once | INTRAVENOUS | Status: AC | PRN
  Administered 2024-04-16: 100 mL via INTRAVENOUS

## 2024-04-21 DIAGNOSIS — E039 Hypothyroidism, unspecified: Secondary | ICD-10-CM | POA: Diagnosis not present

## 2024-05-16 NOTE — Addendum Note (Signed)
 Addended by: RANDY HAMP SAILOR on: 05/16/2024 12:06 PM   Modules accepted: Orders

## 2024-06-17 ENCOUNTER — Ambulatory Visit: Admitting: Pulmonary Disease

## 2024-08-15 ENCOUNTER — Ambulatory Visit: Admitting: Internal Medicine
